# Patient Record
Sex: Female | Born: 1950 | Hispanic: Yes | Smoking: Never smoker
Health system: Southern US, Community
[De-identification: ages and names within clinical notes are randomized; demographics above are authoritative.]

## PROBLEM LIST (undated history)

## (undated) DIAGNOSIS — R202 Paresthesia of skin: Secondary | ICD-10-CM

## (undated) DIAGNOSIS — R2689 Other abnormalities of gait and mobility: Secondary | ICD-10-CM

## (undated) DIAGNOSIS — R42 Dizziness and giddiness: Secondary | ICD-10-CM

## (undated) DIAGNOSIS — R112 Nausea with vomiting, unspecified: Secondary | ICD-10-CM

## (undated) DIAGNOSIS — M5090 Cervical disc disorder, unspecified, unspecified cervical region: Secondary | ICD-10-CM

## (undated) DIAGNOSIS — H9319 Tinnitus, unspecified ear: Secondary | ICD-10-CM

## (undated) DIAGNOSIS — Z87440 Personal history of urinary (tract) infections: Secondary | ICD-10-CM

## (undated) DIAGNOSIS — L661 Lichen planopilaris, unspecified: Secondary | ICD-10-CM

## (undated) DIAGNOSIS — H5319 Other subjective visual disturbances: Secondary | ICD-10-CM

## (undated) DIAGNOSIS — Z8619 Personal history of other infectious and parasitic diseases: Secondary | ICD-10-CM

## (undated) DIAGNOSIS — M3500 Sicca syndrome, unspecified: Secondary | ICD-10-CM

## (undated) DIAGNOSIS — E119 Type 2 diabetes mellitus without complications: Secondary | ICD-10-CM

## (undated) DIAGNOSIS — M5126 Other intervertebral disc displacement, lumbar region: Secondary | ICD-10-CM

## (undated) DIAGNOSIS — G629 Polyneuropathy, unspecified: Secondary | ICD-10-CM

## (undated) DIAGNOSIS — R296 Repeated falls: Secondary | ICD-10-CM

## (undated) DIAGNOSIS — I219 Acute myocardial infarction, unspecified: Secondary | ICD-10-CM

## (undated) DIAGNOSIS — I2510 Atherosclerotic heart disease of native coronary artery without angina pectoris: Secondary | ICD-10-CM

## (undated) DIAGNOSIS — R200 Anesthesia of skin: Secondary | ICD-10-CM

## (undated) DIAGNOSIS — N200 Calculus of kidney: Secondary | ICD-10-CM

## (undated) DIAGNOSIS — E7800 Pure hypercholesterolemia, unspecified: Secondary | ICD-10-CM

## (undated) DIAGNOSIS — Z973 Presence of spectacles and contact lenses: Secondary | ICD-10-CM

## (undated) DIAGNOSIS — A419 Sepsis, unspecified organism: Secondary | ICD-10-CM

## (undated) DIAGNOSIS — Z8719 Personal history of other diseases of the digestive system: Secondary | ICD-10-CM

## (undated) DIAGNOSIS — R319 Hematuria, unspecified: Secondary | ICD-10-CM

## (undated) DIAGNOSIS — J329 Chronic sinusitis, unspecified: Secondary | ICD-10-CM

## (undated) DIAGNOSIS — I10 Essential (primary) hypertension: Secondary | ICD-10-CM

## (undated) DIAGNOSIS — T8859XA Other complications of anesthesia, initial encounter: Secondary | ICD-10-CM

## (undated) DIAGNOSIS — F419 Anxiety disorder, unspecified: Secondary | ICD-10-CM

## (undated) DIAGNOSIS — Z87898 Personal history of other specified conditions: Secondary | ICD-10-CM

## (undated) DIAGNOSIS — W19XXXA Unspecified fall, initial encounter: Secondary | ICD-10-CM

## (undated) DIAGNOSIS — M797 Fibromyalgia: Secondary | ICD-10-CM

## (undated) DIAGNOSIS — Z87442 Personal history of urinary calculi: Secondary | ICD-10-CM

## (undated) HISTORY — PX: CARDIAC CATHETERIZATION: SHX172

## (undated) HISTORY — DX: Lichen planopilaris: L66.1

## (undated) HISTORY — DX: Chronic sinusitis, unspecified: J32.9

## (undated) HISTORY — PX: ABDOMINAL HYSTERECTOMY: SHX81

## (undated) HISTORY — PX: URETERAL STENT PLACEMENT: SHX822

## (undated) HISTORY — PX: NASAL SINUS SURGERY: SHX719

## (undated) HISTORY — PX: TUBAL LIGATION: SHX77

## (undated) HISTORY — DX: Lichen planopilaris, unspecified: L66.10

## (undated) HISTORY — DX: Anxiety disorder, unspecified: F41.9

## (undated) HISTORY — PX: OTHER SURGICAL HISTORY: SHX169

## (undated) HISTORY — DX: Other subjective visual disturbances: H53.19

---

## 2015-01-21 ENCOUNTER — Other Ambulatory Visit: Payer: Self-pay | Admitting: Family Medicine

## 2015-01-21 DIAGNOSIS — R748 Abnormal levels of other serum enzymes: Secondary | ICD-10-CM

## 2015-01-28 ENCOUNTER — Ambulatory Visit
Admission: RE | Admit: 2015-01-28 | Discharge: 2015-01-28 | Disposition: A | Discharge status: Home / Self Care | Payer: Medicare Other | Source: Ambulatory Visit | Attending: Family Medicine | Admitting: Family Medicine

## 2015-01-28 DIAGNOSIS — R748 Abnormal levels of other serum enzymes: Secondary | ICD-10-CM

## 2015-03-29 ENCOUNTER — Other Ambulatory Visit: Payer: Self-pay | Admitting: Family Medicine

## 2015-03-29 DIAGNOSIS — R319 Hematuria, unspecified: Secondary | ICD-10-CM

## 2015-03-30 ENCOUNTER — Ambulatory Visit
Admission: RE | Admit: 2015-03-30 | Discharge: 2015-03-30 | Disposition: A | Discharge status: Home / Self Care | Payer: Medicare Other | Source: Ambulatory Visit | Attending: Family Medicine | Admitting: Family Medicine

## 2015-03-30 DIAGNOSIS — R319 Hematuria, unspecified: Secondary | ICD-10-CM

## 2015-04-19 ENCOUNTER — Other Ambulatory Visit: Payer: Self-pay | Admitting: Urology

## 2015-04-19 ENCOUNTER — Other Ambulatory Visit (HOSPITAL_COMMUNITY): Payer: Self-pay | Admitting: Urology

## 2015-04-19 DIAGNOSIS — N200 Calculus of kidney: Secondary | ICD-10-CM

## 2015-05-14 ENCOUNTER — Encounter (HOSPITAL_COMMUNITY): Payer: Self-pay

## 2015-05-14 ENCOUNTER — Encounter (HOSPITAL_COMMUNITY)
Admission: RE | Admit: 2015-05-14 | Discharge: 2015-05-14 | Disposition: A | Discharge status: Home / Self Care | Payer: Medicare Other | Source: Ambulatory Visit | Attending: Urology | Admitting: Urology

## 2015-05-14 DIAGNOSIS — N200 Calculus of kidney: Secondary | ICD-10-CM | POA: Insufficient documentation

## 2015-05-14 DIAGNOSIS — Z01818 Encounter for other preprocedural examination: Secondary | ICD-10-CM | POA: Insufficient documentation

## 2015-05-14 HISTORY — DX: Unspecified fall, initial encounter: W19.XXXA

## 2015-05-14 HISTORY — DX: Paresthesia of skin: R20.2

## 2015-05-14 HISTORY — DX: Sjogren syndrome, unspecified: M35.00

## 2015-05-14 HISTORY — DX: Personal history of other specified conditions: Z87.898

## 2015-05-14 HISTORY — DX: Fibromyalgia: M79.7

## 2015-05-14 HISTORY — DX: Dizziness and giddiness: R42

## 2015-05-14 HISTORY — DX: Presence of spectacles and contact lenses: Z97.3

## 2015-05-14 HISTORY — DX: Personal history of urinary (tract) infections: Z87.440

## 2015-05-14 HISTORY — DX: Sepsis, unspecified organism: A41.9

## 2015-05-14 HISTORY — DX: Personal history of other infectious and parasitic diseases: Z86.19

## 2015-05-14 HISTORY — DX: Polyneuropathy, unspecified: G62.9

## 2015-05-14 HISTORY — DX: Tinnitus, unspecified ear: H93.19

## 2015-05-14 HISTORY — DX: Anesthesia of skin: R20.0

## 2015-05-14 HISTORY — DX: Other abnormalities of gait and mobility: R26.89

## 2015-05-14 HISTORY — DX: Pure hypercholesterolemia, unspecified: E78.00

## 2015-05-14 HISTORY — DX: Hematuria, unspecified: R31.9

## 2015-05-14 HISTORY — DX: Repeated falls: R29.6

## 2015-05-14 HISTORY — DX: Calculus of kidney: N20.0

## 2015-05-14 HISTORY — DX: Personal history of other diseases of the digestive system: Z87.19

## 2015-05-14 HISTORY — DX: Essential (primary) hypertension: I10

## 2015-05-14 HISTORY — DX: Cervical disc disorder, unspecified, unspecified cervical region: M50.90

## 2015-05-14 HISTORY — DX: Other intervertebral disc displacement, lumbar region: M51.26

## 2015-05-14 LAB — CBC
HEMATOCRIT: 42.7 % (ref 36.0–46.0)
HEMOGLOBIN: 14.5 g/dL (ref 12.0–15.0)
MCH: 32.4 pg (ref 26.0–34.0)
MCHC: 34 g/dL (ref 30.0–36.0)
MCV: 95.5 fL (ref 78.0–100.0)
Platelets: 213 10*3/uL (ref 150–400)
RBC: 4.47 MIL/uL (ref 3.87–5.11)
RDW: 12 % (ref 11.5–15.5)
WBC: 5.2 10*3/uL (ref 4.0–10.5)

## 2015-05-14 LAB — BASIC METABOLIC PANEL
ANION GAP: 10 (ref 5–15)
BUN: 20 mg/dL (ref 6–20)
CHLORIDE: 100 mmol/L — AB (ref 101–111)
CO2: 28 mmol/L (ref 22–32)
CREATININE: 1 mg/dL (ref 0.44–1.00)
Calcium: 9.6 mg/dL (ref 8.9–10.3)
GFR calc non Af Amer: 58 mL/min — ABNORMAL LOW (ref >60)
GLUCOSE: 131 mg/dL — AB (ref 65–99)
Potassium: 4.1 mmol/L (ref 3.5–5.1)
Sodium: 138 mmol/L (ref 135–145)

## 2015-05-14 NOTE — Progress Notes (Signed)
Your patient has screened at an elevated risk for Obstructive Sleep Apnea using the Stop-Bang Tool during a pre-surgical visit. Pt scored at high risk.  

## 2015-05-14 NOTE — Patient Instructions (Addendum)
Tiffany Odonnell  05/14/2015   Your procedure is scheduled on: Monday May 31, 2015   Report to [facility]  Entrance take [place]  elevators to 3rd floor to  Short Stay Center at 7:30 AM.  Call this number if you have problems the morning of surgery [phone]   Remember: ONLY 1 PERSON MAY GO WITH YOU TO SHORT STAY TO GET  READY MORNING OF YOUR SURGERY.  Do not eat food or drink liquids :After Midnight.     MAY USE EYE DROPS DAY OF SURGERY IF NEEDED (BRING WITH YOU DAY OF SURGERY)                               You may not have any metal on your body including hair pins and              piercings  Do not wear jewelry, make-up, lotions, powders or perfumes, deodorant             Do not wear nail polish.  Do not shave  48 hours prior to surgery.               Do not bring valuables to the hospital. [facility] IS NOT             RESPONSIBLE   FOR VALUABLES.  Contacts, dentures or bridgework may not be worn into surgery.  Leave suitcase in the car. After surgery it may be brought to your room.   _____________________________________________________________________             [facility] - Preparing for Surgery Before surgery, you can play an important role.  Because skin is not sterile, your skin needs to be as free of germs as possible.  You can reduce the number of germs on your skin by washing with CHG (chlorahexidine gluconate) soap before surgery.  CHG is an antiseptic cleaner which kills germs and bonds with the skin to continue killing germs even after washing. Please DO NOT use if you have an allergy to CHG or antibacterial soaps.  If your skin becomes reddened/irritated stop using the CHG and inform your nurse when you arrive at Short Stay. Do not shave (including legs and underarms) for at least 48 hours prior to the first CHG shower.  You may shave your face/neck. Please follow these instructions carefully:  1.  Shower with CHG Soap the night  before surgery and the  morning of Surgery.  2.  If you choose to wash your hair, wash your hair first as usual with your  normal  shampoo.  3.  After you shampoo, rinse your hair and body thoroughly to remove the  shampoo.                           4.  Use CHG as you would any other liquid soap.  You can apply chg directly  to the skin and wash                       Gently with a scrungie or clean washcloth.  5.  Apply the CHG Soap to your body ONLY FROM THE NECK DOWN.   Do not use on face/ open  Wound or open sores. Avoid contact with eyes, ears mouth and genitals (private parts).                       Wash face,  Genitals (private parts) with your normal soap.             6.  Wash thoroughly, paying special attention to the area where your surgery  will be performed.  7.  Thoroughly rinse your body with warm water from the neck down.  8.  DO NOT shower/wash with your normal soap after using and rinsing off  the CHG Soap.                9.  Pat yourself dry with a clean towel.            10.  Wear clean pajamas.            11.  Place clean sheets on your bed the night of your first shower and do not  sleep with pets. Day of Surgery : Do not apply any lotions/deodorants the morning of surgery.  Please wear clean clothes to the hospital/surgery center.  FAILURE TO FOLLOW THESE INSTRUCTIONS MAY RESULT IN THE CANCELLATION OF YOUR SURGERY PATIENT SIGNATURE_________________________________  NURSE SIGNATURE__________________________________  ________________________________________________________________________

## 2015-05-26 ENCOUNTER — Other Ambulatory Visit: Payer: Self-pay | Admitting: Radiology

## 2015-05-28 ENCOUNTER — Other Ambulatory Visit: Payer: Self-pay | Admitting: Radiology

## 2015-05-30 MED ORDER — ATENOLOL 100 MG PO TABS
100.0000 mg | ORAL_TABLET | Freq: Once | ORAL | Status: AC
  Administered 2015-05-31: 100 mg via ORAL
  Filled 2015-05-30: qty 1

## 2015-05-30 NOTE — Discharge Instructions (Signed)

## 2015-05-30 NOTE — H&P (Signed)
64 YO female patient with a large right staghorn calculus.    GU hx:  Initially seen Oct 2016 as a f/u from 9/16 the patient was experiencing some right flank pain that was different from flank pain she had experienced previously with kidney stones. She also indicated that she was experiencing intermittent gross hematuria with activity.     She has a history of Sjogren's syndrome placing her at risk for stone formation due to distal RTA associated with the syndrome. She is taking a blood pressure medicine that contains a thiazide.     Renal ultrasound 03/30/15 revealed right renal calculi the largest measuring 9 mm by ultrasound and no left renal calculi with what appeared to be a left renal cyst that was small and had no blood flow but was called indeterminate by the radiologist. Visualization was somewhat poor.    She reports a history of calculus disease having passed stones over the years primarily from the right hand side. She also indicates that in 2013 she underwent a right ureteral stent placement for stone and also has had lithotripsy that that the pain she was experiencing in her back was due to her history of back pain. The last time she passed a stone was about 1.5 years ago.    Past Medical History Problems  1. History of Anxiety (F41.9) 2. History of fibromyalgia (Z87.39) 3. History of irritable bowel syndrome (Z87.19)  Surgical History Problems  1. History of Bladder Surgery 2. History of Frontal Sinusotomy 3. History of Hysterectomy 4. History of Knee Surgery Right 5. History of Oophorectomy 6. History of Tubal Ligation  Current Meds 1. Atenolol-Chlorthalidone 100-25 MG Oral Tablet;  Therapy: (Recorded:13Oct2016) to Recorded 2. Cyclobenzaprine HCl TABS;  Therapy: (Recorded:17Nov2016) to Recorded 3. Milk Thistle CAPS;  Therapy: (Recorded:13Oct2016) to Recorded 4. Omega 3 1000 MG Oral Capsule;  Therapy: (Recorded:13Oct2016) to Recorded 5. Vitamin B Complex  CAPS;  Therapy: (Recorded:13Oct2016) to Recorded 6. Vitamin D3 CAPS;  Therapy: (Recorded:13Oct2016) to Recorded  Allergies Medication  1. Cipro TABS 2. Macrobid CAPS 3. Sulfa Drugs 4. Tetracyclines  Family History Problems  1. Family history of cardiac disorder (Z82.49) : Mother 2. Family history of diabetes mellitus (Z83.3) : Father  Social History Problems  1. Denied: History of Alcohol use 2. Denied: History of Caffeine use 3. Divorced 4. Never a smoker 5. Two children    Results/Data  The following clinical lab reports were reviewed:  UA: appears grossly infected Will culture. Selected Results  URINE CULTURE 17Nov2016 09:35AM Myrtie Soman, Diane SOURCE : CLEAN CATCH SPECIMEN TYPE: URINE  Test Name Result Flag Reference CULTURE, URINE Culture, Urine   ===== COLONY COUNT: =====  >=100,000 COLONIES/ML   FINAL REPORT: PROTEUS MIRABILIS    THIS ORGANISM MAY SHOW IMIPENEM RESISTANCE BY MECHANISMS OTHER THAN A CARBAPENEMASE.   SENSITIVITY FOR: PROTEUS MIRABILIS    AMPICILLIN                             SENSITIVE        <=2    AMOX/CLAVULANIC                        SENSITIVE        <=2    AMPICILLIN/SUL                         SENSITIVE        <=2  PIPERACILLIN/TAZO                      SENSITIVE        <=4    IMIPENEM                               INDETERMINATE      2    CEFAZOLIN                              NR               <=4    CEFTRIAXONE                            SENSITIVE        <=1    CEFTAZIDIME                            SENSITIVE        <=1    CEFEPIME                               SENSITIVE        <=1    GENTAMICIN                             SENSITIVE        <=1    TOBRAMYCIN                             SENSITIVE        <=1    CIPROFLOXACIN                          SENSITIVE     <=0.25    LEVOFLOXACIN                           SENSITIVE     <=0.12    NITROFURANTOIN                         RESISTANT        128    TRIMETH/SULFA                           SENSITIVE       <=20   Review of Systems Genitourinary, constitutional, skin, eye, otolaryngeal, hematologic/lymphatic, cardiovascular, pulmonary, endocrine, musculoskeletal, gastrointestinal, neurological and psychiatric system(s) were reviewed and pertinent findings if present are noted and are otherwise negative.  Genitourinary: urinary frequency, urinary urgency, nocturia, incontinence and hematuria.  Constitutional: night sweats and feeling tired (fatigue).  Integumentary: skin rash/lesion.  Eyes: blurred vision.  ENT: sinus problems.  Neurological: dizziness.  Psychiatric: anxiety.    Vitals Vital Signs  Height: 5 ft 1 in Weight: 178 lb  BMI Calculated: 33.63 BSA Calculated: 1.8 Blood Pressure: 140 / 76 Heart Rate: 65  Physical Exam Constitutional: Well nourished and well developed . No acute distress.   ENT:. The ears and nose are normal in appearance.   Neck: The appearance  of the neck is normal and no neck mass is present.   Pulmonary: No respiratory distress and normal respiratory rhythm and effort.   Cardiovascular: Heart rate and rhythm are normal . No peripheral edema.   Abdomen: The abdomen is soft and nontender. No masses are palpated. No CVA tenderness. No hernias are palpable. No hepatosplenomegaly noted.   Lymphatics: The femoral and inguinal nodes are not enlarged or tender.   Skin: Normal skin turgor, no visible rash and no visible skin lesions.   Neuro/Psych:. Mood and affect are appropriate.    Results/Data  Old records or history reviewed: Notes from Dr. Pecola [facility]isure as above.  The following images/tracing/specimen were independently visualized:  KUB: I note a staghorn calculus filling the entire kidney on the right hand side measuring 7.5 cm in length by 4.6 cm in width. I see no stones along the course of the ureters.  The following radiology reports were reviewed: Renal ultrasound.    Assessment   We discussed the management of urinary stones.  These options include observation, ureteroscopy, shockwave lithotripsy, and PCNL. We discussed which options are relevant to these particular stones. We discussed the natural history of stones as well as the complications of untreated stones and the impact on quality of life without treatment as well as with each of the above listed treatments. We also discussed the efficacy of each treatment in its ability to clear the stone burden. With any of these management options I discussed the signs and symptoms of infection and the need for emergent treatment should these be experienced. For each option we discussed the ability of each procedure to clear the patient of their stone burden.    For observation I described the risks which include but are not limited to silent renal damage, life-threatening infection, need for emergent surgery, failure to pass stone, and pain.    For ureteroscopy I described the risks which include heart attack, stroke, pulmonary embolus, death, bleeding, infection, damage to contiguous structures, positioning injury, ureteral stricture, ureteral avulsion, ureteral injury, need for ureteral stent, inability to perform ureteroscopy, need for an interval procedure, inability to clear stone burden, stent discomfort and pain.    For shockwave lithotripsy I described the risks which include arrhythmia, kidney contusion, kidney hemorrhage, need for transfusion, long-term risk of diabetes or hypertension, back discomfort, flank ecchymosis, flank abrasion, inability to break up stone, inability to pass stone fragments, Steinstrasse, infection associated with obstructing stones, need for different surgical procedure, need for repeat shockwave lithotripsy, and death.    For PCNL I described the risks including heart attack, sure, pulmonary embolus, death, positioning injury, pneumothorax, hydrothorax, need for chest tube, inability to clear stone burden, renal laceration, arterial venous  fistula or malformation, need for embolization of kidney, loss of kidney or renal function, need for repeat procedure, need for prolonged nephrostomy tube, ureteral avulsion, fistula.     I discussed with her the fact that her stone is causing her some discomfort although it does not appear to be causing obstruction and therefore treatment is not urgent but is required. We discussed the reason that ureteroscopy and lithotripsy would not be good treatment options due to the size of her stone and therefore I have recommended PCNL. Due to the large size of her stone we did discuss the fact that this would likely be a staged procedure requiring a second procedure either ureteroscopically or through an existing nephrostomy tract or new nephrostomy tract. She would like to discuss this further with her  family. [facility] the [facility]. I asked her to contact me if she would like to proceed here in [place].    I did obtain a CT scan today in order to delineate the anatomy of her collecting system and her colon relative to her right kidney in preparation for a percutaneous procedure. The scan revealed a complete right staghorn calculus with some scattered diverticuli that appear to be either intraparenchymal or more likely in peripheral calyces. She also had some punctate stone seen on the left-hand side. Her staghorn calculus had Hounsfield units that ranged from 450-850 depending on the area measured.   A urine culture was positive for Proteus indicating that her stone is likely struvite and every attempt will therefore be made to clear her completely of all stone material to prevent recurrence.  The organism was sensitive to ampicillin and she was placed on this antibiotic and remained on it until the time of her surgery.  Plan:   She will be scheduled for a right percutaneous nephrostolithotomy.  This will be the first stage of a two-stage procedure.

## 2015-05-31 ENCOUNTER — Ambulatory Visit (HOSPITAL_COMMUNITY)
Admission: RE | Admit: 2015-05-31 | Discharge: 2015-05-31 | Disposition: A | Discharge status: Home / Self Care | Payer: Medicare Other | Source: Ambulatory Visit | Attending: Urology | Admitting: Urology

## 2015-05-31 ENCOUNTER — Encounter (HOSPITAL_COMMUNITY): Payer: Self-pay | Admitting: Registered Nurse

## 2015-05-31 ENCOUNTER — Ambulatory Visit (HOSPITAL_COMMUNITY): Payer: Medicare Other | Admitting: Registered Nurse

## 2015-05-31 ENCOUNTER — Ambulatory Visit (HOSPITAL_COMMUNITY)
Admission: RE | Admit: 2015-05-31 | Discharge: 2015-06-01 | Disposition: A | Discharge status: Home / Self Care | Payer: Medicare Other | Source: Ambulatory Visit | Attending: Urology | Admitting: Urology

## 2015-05-31 ENCOUNTER — Encounter (HOSPITAL_COMMUNITY): Payer: Self-pay

## 2015-05-31 ENCOUNTER — Encounter (HOSPITAL_COMMUNITY)
Admission: RE | Disposition: A | Discharge status: Home / Self Care | Payer: Self-pay | Source: Ambulatory Visit | Attending: Urology

## 2015-05-31 ENCOUNTER — Ambulatory Visit (HOSPITAL_COMMUNITY): Payer: Medicare Other

## 2015-05-31 DIAGNOSIS — J479 Bronchiectasis, uncomplicated: Secondary | ICD-10-CM | POA: Diagnosis not present

## 2015-05-31 DIAGNOSIS — Z90710 Acquired absence of both cervix and uterus: Secondary | ICD-10-CM | POA: Insufficient documentation

## 2015-05-31 DIAGNOSIS — B964 Proteus (mirabilis) (morganii) as the cause of diseases classified elsewhere: Secondary | ICD-10-CM | POA: Diagnosis not present

## 2015-05-31 DIAGNOSIS — M797 Fibromyalgia: Secondary | ICD-10-CM | POA: Diagnosis not present

## 2015-05-31 DIAGNOSIS — R2689 Other abnormalities of gait and mobility: Secondary | ICD-10-CM | POA: Insufficient documentation

## 2015-05-31 DIAGNOSIS — Z882 Allergy status to sulfonamides status: Secondary | ICD-10-CM | POA: Diagnosis not present

## 2015-05-31 DIAGNOSIS — G629 Polyneuropathy, unspecified: Secondary | ICD-10-CM | POA: Diagnosis not present

## 2015-05-31 DIAGNOSIS — N390 Urinary tract infection, site not specified: Secondary | ICD-10-CM | POA: Diagnosis not present

## 2015-05-31 DIAGNOSIS — M3500 Sicca syndrome, unspecified: Secondary | ICD-10-CM | POA: Diagnosis not present

## 2015-05-31 DIAGNOSIS — G4730 Sleep apnea, unspecified: Secondary | ICD-10-CM | POA: Diagnosis not present

## 2015-05-31 DIAGNOSIS — N200 Calculus of kidney: Secondary | ICD-10-CM

## 2015-05-31 DIAGNOSIS — I10 Essential (primary) hypertension: Secondary | ICD-10-CM | POA: Diagnosis not present

## 2015-05-31 DIAGNOSIS — Z881 Allergy status to other antibiotic agents status: Secondary | ICD-10-CM | POA: Insufficient documentation

## 2015-05-31 DIAGNOSIS — K449 Diaphragmatic hernia without obstruction or gangrene: Secondary | ICD-10-CM | POA: Insufficient documentation

## 2015-05-31 DIAGNOSIS — Z87442 Personal history of urinary calculi: Secondary | ICD-10-CM | POA: Insufficient documentation

## 2015-05-31 DIAGNOSIS — E7800 Pure hypercholesterolemia, unspecified: Secondary | ICD-10-CM | POA: Insufficient documentation

## 2015-05-31 HISTORY — PX: NEPHROLITHOTOMY: SHX5134

## 2015-05-31 LAB — BASIC METABOLIC PANEL
Anion gap: 12 (ref 5–15)
BUN: 18 mg/dL (ref 6–20)
CALCIUM: 9.7 mg/dL (ref 8.9–10.3)
CO2: 26 mmol/L (ref 22–32)
CREATININE: 1.07 mg/dL — AB (ref 0.44–1.00)
Chloride: 100 mmol/L — ABNORMAL LOW (ref 101–111)
GFR calc non Af Amer: 54 mL/min — ABNORMAL LOW (ref >60)
GLUCOSE: 145 mg/dL — AB (ref 65–99)
Potassium: 3 mmol/L — ABNORMAL LOW (ref 3.5–5.1)
Sodium: 138 mmol/L (ref 135–145)

## 2015-05-31 LAB — CBC
HCT: 41.4 % (ref 36.0–46.0)
Hemoglobin: 14.3 g/dL (ref 12.0–15.0)
MCH: 32.6 pg (ref 26.0–34.0)
MCHC: 34.5 g/dL (ref 30.0–36.0)
MCV: 94.5 fL (ref 78.0–100.0)
PLATELETS: 213 10*3/uL (ref 150–400)
RBC: 4.38 MIL/uL (ref 3.87–5.11)
RDW: 12.1 % (ref 11.5–15.5)
WBC: 5.2 10*3/uL (ref 4.0–10.5)

## 2015-05-31 LAB — APTT: aPTT: 28 seconds (ref 24–37)

## 2015-05-31 LAB — PROTIME-INR
INR: 0.98 (ref 0.00–1.49)
PROTHROMBIN TIME: 13.2 s (ref 11.6–15.2)

## 2015-05-31 SURGERY — NEPHROLITHOTOMY PERCUTANEOUS
Anesthesia: General | Laterality: Right

## 2015-05-31 MED ORDER — MIDAZOLAM HCL 5 MG/5ML IJ SOLN
INTRAMUSCULAR | Status: DC | PRN
  Administered 2015-05-31: 1 mg via INTRAVENOUS

## 2015-05-31 MED ORDER — NEOSTIGMINE METHYLSULFATE 10 MG/10ML IV SOLN
INTRAVENOUS | Status: DC | PRN
  Administered 2015-05-31: 4 mg via INTRAVENOUS

## 2015-05-31 MED ORDER — SODIUM CHLORIDE 0.9 % IR SOLN
Status: DC | PRN
  Administered 2015-05-31: 24000 mL

## 2015-05-31 MED ORDER — MIDAZOLAM HCL 2 MG/2ML IJ SOLN
INTRAMUSCULAR | Status: AC | PRN
  Administered 2015-05-31 (×5): 1 mg via INTRAVENOUS

## 2015-05-31 MED ORDER — FENTANYL CITRATE (PF) 250 MCG/5ML IJ SOLN
INTRAMUSCULAR | Status: AC
  Filled 2015-05-31: qty 5

## 2015-05-31 MED ORDER — FENTANYL CITRATE (PF) 100 MCG/2ML IJ SOLN
INTRAMUSCULAR | Status: AC
  Filled 2015-05-31: qty 4

## 2015-05-31 MED ORDER — MIDAZOLAM HCL 2 MG/2ML IJ SOLN
INTRAMUSCULAR | Status: AC
  Filled 2015-05-31: qty 2

## 2015-05-31 MED ORDER — ONDANSETRON HCL 4 MG/2ML IJ SOLN
INTRAMUSCULAR | Status: DC | PRN
  Administered 2015-05-31: 4 mg via INTRAVENOUS

## 2015-05-31 MED ORDER — IOHEXOL 300 MG/ML  SOLN
3.0000 mL | Freq: Once | INTRAMUSCULAR | Status: AC | PRN
  Administered 2015-05-31: 3 mL

## 2015-05-31 MED ORDER — ROCURONIUM BROMIDE 100 MG/10ML IV SOLN
INTRAVENOUS | Status: DC | PRN
  Administered 2015-05-31: 30 mg via INTRAVENOUS

## 2015-05-31 MED ORDER — LACTATED RINGERS IV SOLN
INTRAVENOUS | Status: DC | PRN
  Administered 2015-05-31 (×2): via INTRAVENOUS

## 2015-05-31 MED ORDER — IOHEXOL 300 MG/ML  SOLN
INTRAMUSCULAR | Status: DC | PRN
  Administered 2015-05-31: 3 mL via URETHRAL

## 2015-05-31 MED ORDER — LIDOCAINE HCL 1 % IJ SOLN
INTRAMUSCULAR | Status: AC
  Filled 2015-05-31: qty 20

## 2015-05-31 MED ORDER — CEFAZOLIN SODIUM-DEXTROSE 2-3 GM-% IV SOLR
INTRAVENOUS | Status: AC
  Filled 2015-05-31: qty 50

## 2015-05-31 MED ORDER — HYDROMORPHONE HCL 1 MG/ML IJ SOLN
0.2500 mg | INTRAMUSCULAR | Status: DC | PRN
  Administered 2015-05-31: 0.5 mg via INTRAVENOUS
  Administered 2015-05-31: 0.25 mg via INTRAVENOUS

## 2015-05-31 MED ORDER — OXYCODONE-ACETAMINOPHEN 5-325 MG PO TABS
1.0000 | ORAL_TABLET | ORAL | Status: DC | PRN
  Administered 2015-06-01: 1 via ORAL
  Filled 2015-05-31: qty 1

## 2015-05-31 MED ORDER — SUCCINYLCHOLINE CHLORIDE 20 MG/ML IJ SOLN
INTRAMUSCULAR | Status: DC | PRN
  Administered 2015-05-31: 100 mg via INTRAVENOUS

## 2015-05-31 MED ORDER — PROPOFOL 10 MG/ML IV BOLUS
INTRAVENOUS | Status: AC
  Filled 2015-05-31: qty 20

## 2015-05-31 MED ORDER — FENTANYL CITRATE (PF) 100 MCG/2ML IJ SOLN
INTRAMUSCULAR | Status: AC | PRN
  Administered 2015-05-31: 25 ug via INTRAVENOUS
  Administered 2015-05-31: 50 ug via INTRAVENOUS

## 2015-05-31 MED ORDER — PROPOFOL 10 MG/ML IV BOLUS
INTRAVENOUS | Status: DC | PRN
  Administered 2015-05-31: 140 mg via INTRAVENOUS

## 2015-05-31 MED ORDER — GLYCOPYRROLATE 0.2 MG/ML IJ SOLN
INTRAMUSCULAR | Status: AC
Start: 2015-05-31 — End: 2015-05-31
  Filled 2015-05-31: qty 3

## 2015-05-31 MED ORDER — SODIUM CHLORIDE 0.9 % IV SOLN
INTRAVENOUS | Status: DC
  Administered 2015-05-31: 1000 mL via INTRAVENOUS
  Administered 2015-05-31 – 2015-06-01 (×2): via INTRAVENOUS

## 2015-05-31 MED ORDER — HYDROMORPHONE HCL 1 MG/ML IJ SOLN
INTRAMUSCULAR | Status: AC
  Administered 2015-05-31: 0.5 mg via INTRAVENOUS
  Filled 2015-05-31: qty 1

## 2015-05-31 MED ORDER — FENTANYL CITRATE (PF) 100 MCG/2ML IJ SOLN
INTRAMUSCULAR | Status: DC | PRN
  Administered 2015-05-31 (×3): 50 ug via INTRAVENOUS

## 2015-05-31 MED ORDER — LIDOCAINE HCL (CARDIAC) 20 MG/ML IV SOLN
INTRAVENOUS | Status: AC
  Filled 2015-05-31: qty 5

## 2015-05-31 MED ORDER — ONDANSETRON HCL 4 MG/2ML IJ SOLN
INTRAMUSCULAR | Status: AC
  Filled 2015-05-31: qty 2

## 2015-05-31 MED ORDER — GLYCOPYRROLATE 0.2 MG/ML IJ SOLN
INTRAMUSCULAR | Status: DC | PRN
  Administered 2015-05-31: .6 mg via INTRAVENOUS

## 2015-05-31 MED ORDER — EPHEDRINE SULFATE 50 MG/ML IJ SOLN
INTRAMUSCULAR | Status: DC | PRN
  Administered 2015-05-31 (×3): 5 mg via INTRAVENOUS

## 2015-05-31 MED ORDER — HYDROMORPHONE HCL 1 MG/ML IJ SOLN: 0.5000 mg | INTRAMUSCULAR | Status: DC | PRN

## 2015-05-31 MED ORDER — BELLADONNA ALKALOIDS-OPIUM 16.2-60 MG RE SUPP: 1.0000 | Freq: Four times a day (QID) | RECTAL | Status: DC | PRN

## 2015-05-31 MED ORDER — SODIUM CHLORIDE 0.9 % IV SOLN
1.5000 g | Freq: Four times a day (QID) | INTRAVENOUS | Status: AC
  Administered 2015-05-31 – 2015-06-01 (×3): 1.5 g via INTRAVENOUS
  Filled 2015-05-31 (×3): qty 1.5

## 2015-05-31 MED ORDER — ZOLPIDEM TARTRATE 5 MG PO TABS: 5.0000 mg | ORAL_TABLET | Freq: Every evening | ORAL | Status: DC | PRN

## 2015-05-31 MED ORDER — ACETAMINOPHEN 325 MG PO TABS: 650.0000 mg | ORAL_TABLET | ORAL | Status: DC | PRN

## 2015-05-31 MED ORDER — CEFAZOLIN SODIUM-DEXTROSE 2-3 GM-% IV SOLR
2.0000 g | INTRAVENOUS | Status: DC
  Administered 2015-05-31: 2 g via INTRAVENOUS

## 2015-05-31 MED ORDER — MIDAZOLAM HCL 2 MG/2ML IJ SOLN
INTRAMUSCULAR | Status: AC
  Filled 2015-05-31: qty 6

## 2015-05-31 MED ORDER — CEFAZOLIN SODIUM-DEXTROSE 2-3 GM-% IV SOLR: 2.0000 g | INTRAVENOUS | Status: DC

## 2015-05-31 MED ORDER — LIDOCAINE HCL (CARDIAC) 20 MG/ML IV SOLN
INTRAVENOUS | Status: DC | PRN
  Administered 2015-05-31: 80 mg via INTRAVENOUS

## 2015-05-31 MED ORDER — SODIUM CHLORIDE 0.9 % IV SOLN
INTRAVENOUS | Status: DC
  Administered 2015-05-31: 09:00:00 via INTRAVENOUS

## 2015-05-31 MED ORDER — LACTATED RINGERS IV SOLN: INTRAVENOUS | Status: DC

## 2015-05-31 MED ORDER — ONDANSETRON HCL 4 MG/2ML IJ SOLN
4.0000 mg | INTRAMUSCULAR | Status: DC | PRN
  Administered 2015-05-31: 4 mg via INTRAVENOUS
  Filled 2015-05-31: qty 2

## 2015-05-31 SURGICAL SUPPLY — 53 items
APPLICATOR SURGIFLO ENDO (HEMOSTASIS) IMPLANT
BAG URINE DRAINAGE (UROLOGICAL SUPPLIES) IMPLANT
BASKET ZERO TIP NITINOL 2.4FR (BASKET) ×2 IMPLANT
BENZOIN TINCTURE PRP APPL 2/3 (GAUZE/BANDAGES/DRESSINGS) ×2 IMPLANT
BLADE SURG 15 STRL LF DISP TIS (BLADE) ×1 IMPLANT
BLADE SURG 15 STRL SS (BLADE) ×1
CATH FOLEY 2W COUNCIL 20FR 5CC (CATHETERS) IMPLANT
CATH FOLEY 2WAY SLVR  5CC 18FR (CATHETERS)
CATH FOLEY 2WAY SLVR 5CC 18FR (CATHETERS) IMPLANT
CATH FOLEY LATEX FREE 22FR (CATHETERS) ×1
CATH FOLEY LF 22FR (CATHETERS) ×1 IMPLANT
CATH IMAGER II 65CM (CATHETERS) IMPLANT
CATH UROLOGY TORQUE 40 (MISCELLANEOUS) ×2 IMPLANT
CATH X-FORCE N30 NEPHROSTOMY (TUBING) ×2 IMPLANT
COVER SURGICAL LIGHT HANDLE (MISCELLANEOUS) ×2 IMPLANT
DRAPE C-ARM 42X120 X-RAY (DRAPES) ×2 IMPLANT
DRAPE LINGEMAN PERC (DRAPES) ×2 IMPLANT
DRAPE SURG IRRIG POUCH 19X23 (DRAPES) ×2 IMPLANT
DRSG PAD ABDOMINAL 8X10 ST (GAUZE/BANDAGES/DRESSINGS) IMPLANT
DRSG TEGADERM 8X12 (GAUZE/BANDAGES/DRESSINGS) IMPLANT
FIBER LASER FLEXIVA 1000 (UROLOGICAL SUPPLIES) IMPLANT
FIBER LASER FLEXIVA 200 (UROLOGICAL SUPPLIES) IMPLANT
FIBER LASER FLEXIVA 365 (UROLOGICAL SUPPLIES) IMPLANT
FIBER LASER FLEXIVA 550 (UROLOGICAL SUPPLIES) IMPLANT
FIBER LASER TRAC TIP (UROLOGICAL SUPPLIES) IMPLANT
FLOSEAL 10ML (HEMOSTASIS) IMPLANT
GAUZE SPONGE 4X4 12PLY STRL (GAUZE/BANDAGES/DRESSINGS) IMPLANT
GLOVE BIOGEL M 8.0 STRL (GLOVE) ×16 IMPLANT
GOWN STRL REUS W/TWL XL LVL3 (GOWN DISPOSABLE) ×8 IMPLANT
GUIDEWIRE AMPLAZ .035X145 (WIRE) ×4 IMPLANT
GUIDEWIRE STR DUAL SENSOR (WIRE) IMPLANT
HOLDER FOLEY CATH W/STRAP (MISCELLANEOUS) ×2 IMPLANT
KIT BASIN OR (CUSTOM PROCEDURE TRAY) ×2 IMPLANT
MANIFOLD NEPTUNE II (INSTRUMENTS) ×2 IMPLANT
NS IRRIG 1000ML POUR BTL (IV SOLUTION) ×2 IMPLANT
PACK CYSTO (CUSTOM PROCEDURE TRAY) ×2 IMPLANT
PROBE LITHOCLAST ULTRA 3.8X403 (UROLOGICAL SUPPLIES) ×2 IMPLANT
PROBE PNEUMATIC 1.0MMX570MM (UROLOGICAL SUPPLIES) ×2 IMPLANT
SET IRRIG Y TYPE TUR BLADDER L (SET/KITS/TRAYS/PACK) IMPLANT
SET WARMING FLUID IRRIGATION (MISCELLANEOUS) ×2 IMPLANT
SHEATH PEELAWAY SET 9 (SHEATH) ×2 IMPLANT
STENT URET 6FRX24 CONTOUR (STENTS) ×2 IMPLANT
STONE CATCHER W/TUBE ADAPTER (UROLOGICAL SUPPLIES) ×2 IMPLANT
SURGIFLO W/THROMBIN 8M KIT (HEMOSTASIS) IMPLANT
SUT MNCRL AB 4-0 PS2 18 (SUTURE) IMPLANT
SUT SILK 2 0 30  PSL (SUTURE)
SUT SILK 2 0 30 PSL (SUTURE) IMPLANT
SYR 20CC LL (SYRINGE) ×4 IMPLANT
SYRINGE 10CC LL (SYRINGE) ×4 IMPLANT
TOWEL OR NON WOVEN STRL DISP B (DISPOSABLE) ×2 IMPLANT
TRAY FOLEY W/METER SILVER 14FR (SET/KITS/TRAYS/PACK) ×2 IMPLANT
TRAY FOLEY W/METER SILVER 16FR (SET/KITS/TRAYS/PACK) IMPLANT
TUBING CONNECTING 10 (TUBING) ×4 IMPLANT

## 2015-05-31 NOTE — Anesthesia Procedure Notes (Signed)
Procedure Name: Intubation Date/Time: 05/31/2015 10:58 AM Performed by: Jarvis NewcomerARMISTEAD, Tanner Vigna A Pre-anesthesia Checklist: Patient identified, Timeout performed, Emergency Drugs available, Suction available and Patient being monitored Patient Re-evaluated:Patient Re-evaluated prior to inductionOxygen Delivery Method: Circle system utilized Preoxygenation: Pre-oxygenation with 100% oxygen Intubation Type: IV induction Ventilation: Mask ventilation without difficulty Laryngoscope Size: Mac and 4 Grade View: Grade I Tube type: Oral Tube size: 7.0 mm Number of attempts: 1 Airway Equipment and Method: Stylet Placement Confirmation: ETT inserted through vocal cords under direct vision,  breath sounds checked- equal and bilateral and positive ETCO2 Secured at: 21 cm Tube secured with: Tape Dental Injury: Teeth and Oropharynx as per pre-operative assessment

## 2015-05-31 NOTE — Procedures (Signed)
R [id number] antegrade nephroureteral placed to bladder No complication No blood loss. See [facility]ation in [facility].

## 2015-05-31 NOTE — Progress Notes (Signed)
Floor called and ready for pt in 1402:needs bed and pump.

## 2015-05-31 NOTE — Progress Notes (Signed)
Night of surgery note  Subjective: The patient is doing well.  No complaints.  Objective: Vital signs in last 24 hours: Temp:  [97.5 F (36.4 C)-98.1 F (36.7 C)] 97.5 F (36.4 C) (11/28 1550) Pulse Rate:  [50-77] 77 (11/28 1550) Resp:  [10-20] 16 (11/28 1550) BP: (95-151)/(52-93) 98/57 mmHg (11/28 1550) SpO2:  [98 %-100 %] 99 % (11/28 1550) Weight:  [79 kg (174 lb 2.6 oz)] 79 kg (174 lb 2.6 oz) (11/28 1725)  Intake/Output this shift: Total I/O In: 2200 [I.V.:2200] Out: 450 [Urine:350; Blood:100]  Physical Exam:  General: Alert and oriented. Abdomen: Soft, Nondistended. Nephrostomy site: Clean with dry, intact dressing. Nephrostomy catheter draining.  Lab Results:  Recent Labs  05/31/15 0830  HGB 14.3  HCT 41.4    Assessment/Plan: 1) Continue to monitor 2) Per orders   Sherma Vanmetre C. Vernie Ammonsttelin, MD  Garnett FarmTTELIN,Ridhi Hoffert C 05/31/2015, 5:49 PM

## 2015-05-31 NOTE — H&P (Signed)
Chief Complaint: Patient was seen in consultation today for right percutaneous nephrostomy/nephroureteral catheter placement  Referring Physician(s): Ottelin,M  History of Present Illness: Tiffany Odonnell is a 64 y.o. female with history of nephrolithiasis and now with right staghorn calculus who presents today for right nephroureteral catheter placement prior to planned nephrolithotomy.  Past Medical History  Diagnosis Date  . Hypertension   . High cholesterol   . Dizziness   . Numbness and tingling     hands and feet bilat   . Sjogren's disease (HCC)   . Fibromyalgia   . Peripheral neuropathy (HCC)   . Lumbar herniated disc     L5-L6  . History of hiatal hernia   . Blood in urine   . History of frequent urinary tract infections   . Kidney stones   . Wears glasses   . H/O Salmonella gastroenteritis   . Septicemia (HCC)     2013  . Falls   . Cervical disc disorder     bulging disc  . Tinnitus   . Imbalance   . History of vertigo     Past Surgical History  Procedure Laterality Date  . Tubal ligation      1983  . Nasal sinus surgery    . Prolapsed bladder and rectocele surgery     . Abdominal hysterectomy      2014  . Ureteral stent placement      times 2 secondary to kidney stone     Allergies: Ciprofloxacin; Macrolides and ketolides; Sulfa antibiotics; and Tetracyclines & related  Medications: Prior to Admission medications   Medication Sig Start Date End Date Taking? Authorizing Provider  Ascorbic Acid (VITAMIN C WITH ROSE HIPS) 1000 MG tablet Take 1,000 mg by mouth daily.   Yes Historical Provider, MD  atenolol-chlorthalidone (TENORETIC) 100-25 MG tablet Take 1 tablet by mouth daily.   Yes Historical Provider, MD  b complex vitamins tablet Take 1 tablet by mouth daily.   Yes Historical Provider, MD  Cholecalciferol (VITAMIN D) 2000 UNITS tablet Take 2,000 Units by mouth daily.   Yes Historical Provider, MD  Cyanocobalamin (VITAMIN B-12) 5000 MCG TBDP  Take 5,000 mcg by mouth every 7 (seven) days.   Yes Historical Provider, MD  cyclobenzaprine (FLEXERIL) 10 MG tablet Take 5 mg by mouth at bedtime as needed for muscle spasms (insomnia).   Yes Historical Provider, MD  hydroxypropyl methylcellulose / hypromellose (ISOPTO TEARS / GONIOVISC) 2.5 % ophthalmic solution Place 1 drop into both eyes 4 (four) times daily as needed for dry eyes.   Yes Historical Provider, MD  omega-3 acid ethyl esters (LOVAZA) 1 G capsule Take 1 g by mouth daily.   Yes Historical Provider, MD  milk thistle 175 MG tablet Take 1,000 mg by mouth 2 (two) times daily.    Historical Provider, MD     No family history on file.  Social History   Social History  . Marital Status: Divorced    Spouse Name: N/A  . Number of Children: N/A  . Years of Education: N/A   Social History Main Topics  . Smoking status: Never Smoker   . Smokeless tobacco: Never Used  . Alcohol Use: No  . Drug Use: No  . Sexual Activity: No   Other Topics Concern  . Not on file   Social History Narrative      Review of Systems  Constitutional: Negative for fever and chills.  HENT: Positive for sinus pressure.   Respiratory: Negative  for cough.        Occ dyspnea with exertion  Cardiovascular: Negative for chest pain.  Gastrointestinal: Negative for nausea, vomiting, abdominal pain and blood in stool.  Genitourinary: Positive for hematuria and flank pain. Negative for dysuria.  Musculoskeletal: Positive for back pain.  Neurological: Negative for headaches.  Psychiatric/Behavioral: The patient is nervous/anxious.     Vital Signs: BP 130/93 mmHg  Pulse 72  Temp(Src) 98.1 F (36.7 C) (Oral)  Resp 16  SpO2 98%  Physical Exam  Constitutional: She is oriented to person, place, and time. She appears well-developed and well-nourished.  Cardiovascular: Normal rate and regular rhythm.   Pulmonary/Chest: Effort normal and breath sounds normal.  Abdominal: Soft. Bowel sounds are normal.  There is no tenderness.  obese  Musculoskeletal: Normal range of motion. She exhibits no edema.  Neurological: She is alert and oriented to person, place, and time.    Mallampati Score:     Imaging: No results found.  Labs:  CBC:  Recent Labs  05/14/15 0955  WBC 5.2  HGB 14.5  HCT 42.7  PLT 213    COAGS: No results for input(s): INR, APTT in the last 8760 hours.  BMP:  Recent Labs  05/14/15 0955  NA 138  K 4.1  CL 100*  CO2 28  GLUCOSE 131*  BUN 20  CALCIUM 9.6  CREATININE 1.00  GFRNONAA 58*  GFRAA >60    LIVER FUNCTION TESTS: No results for input(s): BILITOT, AST, ALT, ALKPHOS, PROT, ALBUMIN in the last 8760 hours.  TUMOR MARKERS: No results for input(s): AFPTM, CEA, CA199, CHROMGRNA in the last 8760 hours.  Assessment and Plan: 64 y.o. female with history of nephrolithiasis and now with right staghorn calculus who presents today for right nephroureteral catheter placement prior to planned nephrolithotomy.Risks and benefits discussed with the patient including, but not limited to infection, bleeding, significant bleeding causing loss or decrease in renal function or damage to adjacent structures. All of the patient's questions were answered, patient is agreeable to proceed.Consent signed and in chart.     Thank you for this interesting consult.  I greatly enjoyed meeting Tiffany Odonnell and look forward to participating in their care.  A copy of this report was sent to the requesting provider on this date.  Signed: D. Jeananne Rama 05/31/2015, 8:44 AM   I spent a total of 15 minutes in face to face in clinical consultation, greater than 50% of which was counseling/coordinating care for right nephroureteral catheter placement

## 2015-05-31 NOTE — Progress Notes (Signed)
Floor called after pt pended to 1409, floor reports Charge nurse not aware of pt pending/not ready for pt.

## 2015-05-31 NOTE — Op Note (Signed)
PATIENT:  Tiffany Odonnell  PRE-OPERATIVE DIAGNOSIS: Right full staghorn calculus  POST-OPERATIVE DIAGNOSIS: Same  PROCEDURE: 1. Percutaneous nephrostomy sheath placement.  2.  Right percutaneous nephrolithotomy (4 cm.) first stage of a staged procedure. 3. Antegrade right double-J stent placement. 4. Right nephrostomy tube placement  SURGEON:  Debbrah Sampedro Garnett FarmINDICATION: Tiffany Odonnell is a 64 year old female with a history of distal RTA and kidney stones. She developed right flank pain and intermittent gross hematuria and was found to have a full staghorn involving the entire collecting system as well as renal pelvis of her right kidney. We discussed the treatment options and she has elected to proceed with a percutaneous nephrolithotomy. Her culture grew Proteus and she was therefore placed on antibiotics according to sensitivities and has been on this antibiotic up until the day of her surgery.  ANESTHESIA:  General endotracheal  EBL:  300 mL  DRAINS: 16 French Foley catheter in the bladder, a 6 French/24 cm double-J stent in the right ureter and a 22 [place] council tip latex free catheter as a right nephrostomy tube.  LOCAL MEDICATIONS USED:  None  SPECIMEN:  Stone taken for composition analysis.  Description of procedure: After informed consent the patient was taken to the operating room and administered general endotracheal anesthesia. Once fully anesthetized the patient had an 59 French Foley catheter placed was then moved from the stretcher onto the operating room table in a prone position with bony prominences padded and chest pads in place. The flank with exiting nephrostomy catheter was then sterilely prepped and draped in standard fashion. An official timeout was then performed.  Using the existing nephrostomy catheter access I passed a 0.038 inch floppy tipped guidewire down the ureter into the bladder under fluoroscopy.  This was left in place and the nephrostomy catheter was  removed.  A transverse incision was made over the guidewire and a peel-away coaxial catheter was then passed over the guidewire and down the ureter under fluoroscopy.  The inner portion of the coaxial system was then removed and a second guidewire was passed through this catheter and down the ureter into the bladder under fluoroscopy.  The coaxial catheter was then removed and one of the guidewires was secured to the drape as a safety guidewire and the second guidewire was used as a working guidewire.  The NephroMax nephrostomy dilating balloon was then passed over the working guidewire into the area of the renal pelvis under fluoroscopy.  It was then inflated using dilute contrast under fluoroscopy until the balloon was fully inflated.  I then passed the 28 French nephrostomy access sheath over the balloon into the area of the renal pelvis under fluoroscopy and then deflated the balloon and removed the dilating balloon.  The 33 French rigid nephroscope was then passed under direct vision through the nephrostomy access sheath.  Clotted blood was evacuated and the stone was identified. I used the Music therapist to fragment the stone and extract it. Occasionally I was able to pull out a fragment in order to have a specimen for composition analysis. After I completely fragmented and extracted all visible stone in the area the renal pelvis and visible calyces I then switched to the flexible cystoscope and was able to identify further stone in the upper pole. Based on this knowledge I switched back to the rigid scope and was able to visualize the stone and was able to fragment and completely remove it. At the end of the procedure I repeated flexible cystoscopy and  could not identify any further stone within the kidney although there was some clot which could be obscuring stone material.  I back loaded the rigid nephroscope over the working guidewire and passed the double-J stent into the bladder and as I removed  the guidewire good curl was noted in the bladder. I then used 2 prong graspers to position the stent in the renal pelvis as well. I then measured the depth to the area of the renal pelvis and passed the 22 French council tip catheter through the nephrostomy sheath to the measured depth. With fluoroscopy I filled the balloon with 3 mL of dilute contrast and then removed the safety guidewire as well as the nephrostomy sheath which was incised along its length and removed from the catheter. The nephrostomy catheter was then secured to the skin with a figure-of-eight 2-0 silk suture and a sterile, occlusive dressing was applied. The patient tolerated the procedure well with no intraoperative complications. She was awakened and taken to the recovery room in stable and satisfactory condition.  She will be observed overnight and a CT scan will be obtained to assess for the presence of any further stone fragments with anticipation of discharge in the morning.    PLAN OF CARE: Discharge to home after an overnight stay.  PATIENT DISPOSITION:  PACU - hemodynamically stable.

## 2015-05-31 NOTE — Interval H&P Note (Signed)
History and Physical Interval Note:  05/31/2015 3:53 AM  Varney DailyLydia Odonnell  has presented today for surgery, with the diagnosis of RIGHT STAGHORN STONE   The various methods of treatment have been discussed with the patient and family. After consideration of risks, benefits and other options for treatment, the patient has consented to  Procedure(s): RIGHT PERCUTANEOUS NEPHROLITHOTOMY   (Right) HOLMIUM LASER APPLICATION (Right) as a surgical intervention .  The patient's history has been reviewed, patient examined, no change in status, stable for surgery.  I have reviewed the patient's chart and labs.  Questions were answered to the patient's satisfaction.     Garnett FarmTTELIN,Hester Joslin C

## 2015-05-31 NOTE — Anesthesia Preprocedure Evaluation (Addendum)
Anesthesia Evaluation  Patient identified by MRN, date of birth, ID band Patient awake    Reviewed: Allergy & Precautions, H&P , NPO status , Patient's Chart, lab work & pertinent test results  Airway Mallampati: II  TM Distance: >3 FB Neck ROM: full    Dental no notable dental hx. (+) Dental Advisory Given, Teeth Intact   Pulmonary neg pulmonary ROS, sleep apnea ,  Stop bang 5   Pulmonary exam normal breath sounds clear to auscultation       Cardiovascular Exercise Tolerance: Good hypertension, Pt. on medications and Pt. on home beta blockers Normal cardiovascular exam Rhythm:regular Rate:Normal     Neuro/Psych Vertigo. Peripheral neuropathy  Neuromuscular disease negative psych ROS   GI/Hepatic negative GI ROS, Neg liver ROS, hiatal hernia,   Endo/Other  negative endocrine ROS  Renal/GU negative Renal ROS  negative genitourinary   Musculoskeletal   Abdominal   Peds  Hematology negative hematology ROS (+)   Anesthesia Other Findings Sjogren's     Reproductive/Obstetrics negative OB ROS                          Anesthesia Physical Anesthesia Plan  ASA: III  Anesthesia Plan: General   Post-op Pain Management:    Induction: Intravenous  Airway Management Planned: Oral ETT  Additional Equipment:   Intra-op Plan:   Post-operative Plan: Extubation in OR  Informed Consent: I have reviewed the patients History and Physical, chart, labs and discussed the procedure including the risks, benefits and alternatives for the proposed anesthesia with the patient or authorized representative who has indicated his/her understanding and acceptance.   Dental Advisory Given  Plan Discussed with: CRNA and Surgeon  Anesthesia Plan Comments:         Anesthesia Quick Evaluation

## 2015-05-31 NOTE — Transfer of Care (Signed)
Immediate Anesthesia Transfer of Care Note  Patient: Tiffany Odonnell  Procedure(s) Performed: Procedure(s): RIGHT PERCUTANEOUS NEPHROLITHOTOMY   (Right)  Patient Location: PACU  Anesthesia Type:General  Level of Consciousness: awake, alert , oriented and patient cooperative  Airway & Oxygen Therapy: Patient Spontanous Breathing and Patient connected to face mask oxygen  Post-op Assessment: Report given to RN, Post -op Vital signs reviewed and stable and Patient moving all extremities  Post vital signs: Reviewed and stable  Last Vitals:  Filed Vitals:   05/31/15 0829  BP: 130/93  Pulse: 72  Temp: 36.7 C  Resp: 16    Complications: No apparent anesthesia complications

## 2015-05-31 NOTE — Anesthesia Postprocedure Evaluation (Signed)
Anesthesia Post Note  Patient: Tiffany Odonnell  Procedure(s) Performed: Procedure(s) (LRB): RIGHT PERCUTANEOUS NEPHROLITHOTOMY   (Right)  Patient location during evaluation: PACU Anesthesia Type: General Level of consciousness: awake and alert Pain management: pain level controlled Vital Signs Assessment: post-procedure vital signs reviewed and stable Respiratory status: spontaneous breathing, nonlabored ventilation, respiratory function stable and patient connected to nasal cannula oxygen Cardiovascular status: blood pressure returned to baseline and stable Postop Assessment: no signs of nausea or vomiting Anesthetic complications: no    Last Vitals:  Filed Vitals:   05/31/15 1500 05/31/15 1550  BP: 102/58 98/57  Pulse: 63 77  Temp: 36.4 C 36.4 C  Resp: 15 16    Last Pain:  Filed Vitals:   05/31/15 1726  PainSc: Asleep                 Quron Ruddy L

## 2015-05-31 NOTE — Progress Notes (Signed)
RN called floor to take pt to assigned room 1409.  Secretary reports room is now 1402 and it is not clean.

## 2015-06-01 ENCOUNTER — Encounter (HOSPITAL_COMMUNITY): Payer: Self-pay | Admitting: Urology

## 2015-06-01 DIAGNOSIS — N200 Calculus of kidney: Secondary | ICD-10-CM | POA: Diagnosis not present

## 2015-06-01 LAB — HEMOGLOBIN AND HEMATOCRIT, BLOOD
HCT: 28.9 % — ABNORMAL LOW (ref 36.0–46.0)
Hemoglobin: 10.1 g/dL — ABNORMAL LOW (ref 12.0–15.0)

## 2015-06-01 MED ORDER — OXYCODONE HCL 10 MG PO TABS: 10.0000 mg | ORAL_TABLET | ORAL | Status: DC | PRN

## 2015-06-01 MED ORDER — AMOXICILLIN-POT CLAVULANATE 875-125 MG PO TABS: 1.0000 | ORAL_TABLET | Freq: Two times a day (BID) | ORAL | Status: DC

## 2015-06-01 NOTE — Care Management Note (Signed)
Case Management Note  Patient Details  Name: Tiffany Odonnell MRN: [id number] Date of Birth: 1951/05/13  Subjective/Objective: Referral for paying bills information.  Provided patient w/Consumer Credit resource info,& tel#, also provided w/Low cost dental service info.  Patient very appreciative of services.                   Action/Plan:d/c home no needs or orders.   Expected Discharge Date:                  Expected Discharge Plan:  Home/Self Care  In-House Referral:     Discharge planning Services  CM Consult  Post Acute Care Choice:    Choice offered to:     DME Arranged:    DME Agency:     HH Arranged:    HH Agency:     Status of Service:  Completed, signed off  Medicare Important Message Given:    Date Medicare IM Given:    Medicare IM give by:    Date Additional Medicare IM Given:    Additional Medicare Important Message give by:     If discussed at Long Length of Stay Meetings, dates discussed:    Additional Comments:  Lanier ClamMahabir, Hudson Majkowski, RN 06/01/2015, 12:45 PM

## 2015-06-01 NOTE — Discharge Summary (Signed)
Physician Discharge Summary  Patient ID: Tiffany Odonnell MRN: [id number] DOB/AGE: 1951/05/06 64 y.o.  Admit date: 05/31/2015 Discharge date: 06/01/2015  Admission Diagnoses: RIGHT STAGHORN STONE   Discharge Diagnoses:  Active Problems:   Nephrolithiasis   Staghorn calculus mild blood loss anemia  Discharged Condition: good  Hospital Course: the patient had a full staghorn calculus in the right kidney and also was found to have a Proteus UTI suggesting possible struvite stone.  She was placed on antibiotic therapy preoperatively and taken to the operating room where she underwent a right percutaneous nephrolithotomy without apparent complication.  There was some blood loss intraoperatively but the night of surgery she did not appear to have any significant bleeding and was resting comfortably.  There was minimal out her nephrostomy tube which often times is the case. A follow-up CT scan revealed what appears to be about 3 small very small stone fragments remaining within the kidney that almost certainly would be easily passable.  It also revealed that her nephrostomy tube was malpositioned outside of the kidney.  I therefore removed her nephrostomy tube.  She has a stent in place and I left her Foley catheter in for a short time and then it will be removed prior to her discharge.  She will remain on antibiotic therapy using Augmentin and follow up with me as an outpatient.  Significant Diagnostic Studies: Ct Abdomen Wo Contrast  06/01/2015  CLINICAL DATA:  Status post right nephroureteral catheter placement, prior to planned nephrolithotomy. Evaluate for residual stone fragments. Initial encounter. EXAM: CT ABDOMEN WITHOUT CONTRAST TECHNIQUE: Multidetector CT imaging of the abdomen was performed following the standard protocol without IV contrast. COMPARISON:  None. FINDINGS: Patchy airspace opacity is noted at the lower lobes bilaterally, with underlying bronchiectasis. This may reflect  sequelae of chronic infection. A 7 mm hypodensity near the hepatic dome is nonspecific, but may reflect a small cyst. The spleen is unremarkable in appearance. The gallbladder is within normal limits. The pancreas and adrenal glands are unremarkable. The patient's right-sided nephrostomy catheter is noted ending within the soft tissues posterior to the proximal right ureter in abnormal position, with the balloon distended adjacent to the lower pole of the right kidney. The catheter appears to traverse the edge of the right renal cortex. There is underlying mild urine leak extending inferiorly into the right lower quadrant. The right-sided ureteral stent is noted in expected position. At least seven tiny right renal stones are seen scattered about the renal calyces, measuring up to 3 mm in size. Tiny stones are suggested at the lower pole of the left kidney, measuring up to 2 mm in size. There is mild prominence of the right renal collecting system, without significant hydronephrosis. No free fluid is identified. The small bowel is unremarkable in appearance. The stomach is within normal limits. No acute vascular abnormalities are seen. The appendix is normal in caliber and contains trace air, without evidence of appendicitis. The colon is unremarkable in appearance. The bladder is decompressed, with a Foley catheter in place. The patient is status post hysterectomy. No suspicious adnexal masses are seen. No inguinal lymphadenopathy is seen. No acute osseous abnormalities are identified. IMPRESSION: 1. Right nephrostomy catheter is noted ending within the soft tissues posterior to the proximal right ureter, in abnormal position, with the balloon distended outside of the kidney, adjacent to the lower pole of the right kidney. Catheter appears to traverse the edge of the right renal cortex. Underlying mild urine leak noted extending inferiorly into the  right lower quadrant. 2. Right ureteral stent noted in expected  position. No evidence of hydronephrosis at this time. 3. At least 7 tiny right renal stones are seen scattered about the renal calyces, measuring up to 3 mm in size. 4. Tiny stones suggested at the lower pole of the left kidney, measuring up to 2 mm in size. 5. Patchy airspace opacity at the lower lung lobes bilaterally, with underlying bronchiectasis. This may reflect sequelae of chronic infection. 6. 7 mm hypodensity near the hepatic dome is nonspecific, but may reflect a small cyst. Electronically Signed   By: Roanna Raider M.D.   On: 06/01/2015 03:02   Dg C-arm 61-120 Min-no Report  05/31/2015  CLINICAL DATA: surgery C-ARM 61-120 MINUTES Fluoroscopy was utilized by the requesting physician.  No radiographic interpretation.   Ir Ureteral Stent Right New Access W/o Sep Nephrostomy Cath  05/31/2015  CLINICAL DATA:  Symptomatic right nephrolithiasis, planned percutaneous nephrolithotomy EXAM: RIGHT PERCUTANEOUS NEPHROURETERAL CATHETER PLACEMENT UNDER FLUOROSCOPIC GUIDANCE FLUOROSCOPY TIME:  3.9 min, 403 uGym2 DAP TECHNIQUE: The procedure, risks (including but not limited to bleeding, infection, organ damage ), benefits, and alternatives were explained to the patient. Questions regarding the procedure were encouraged and answered. The patient understands and consents to the procedure. Right flank region prepped with Betadine, draped in usual sterile fashion, infiltrated locally with 1% lidocaine. Intravenous Fentanyl and Versed were administered as conscious sedation during continuous cardiorespiratory monitoring by the radiology RN, with a total moderate sedation time of 12 minutes. Under real-time fluoroscopic guidance, a 21-gauge trocar needle was advanced into a posterior lower pole calyx using the radiodense calculus as a guide. A 018 guidewire advanced easily down the ureter. Needle was exchanged over a guidewire for transitional dilator. Contrast injection confirmed appropriate positioning. Catheter  was exchanged over a guidewire for a 5 [place] Kumpe catheter, advanced into the urinary bladder. Radiograph confirms appropriate nephroureteral catheter positioning. Catheter capped and secured externally. The patient tolerated the procedure well. COMPLICATIONS: COMPLICATIONS None. IMPRESSION: 1. Technically successful right percutaneous nephroureteral catheter placement. Electronically Signed   By: Corlis Leak M.D.   On: 05/31/2015 12:20    Discharge Exam: Blood pressure 100/60, pulse 65, temperature 98.3 F (36.8 C), temperature source Oral, resp. rate 18, height  (1.448 m), weight 79 kg (174 lb 2.6 oz), SpO2 99 %.  She is alert, awake and in no distress. Her abdomen is soft and nontender. Her flank reveals the nephrostomy tube exiting without ecchymoses or drainage/bleeding.  Her nephrostomy tube was removed and the site was dressed with a sterile dressing.   Disposition: Final discharge disposition not confirmed  Discharge Instructions    Discharge patient    Complete by:  As directed             Medication List    TAKE these medications        amoxicillin-clavulanate 875-125 MG tablet  Commonly known as:  AUGMENTIN  Take 1 tablet by mouth 2 (two) times daily.     atenolol-chlorthalidone 100-25 MG tablet  Commonly known as:  TENORETIC  Take 1 tablet by mouth daily.     b complex vitamins tablet  Take 1 tablet by mouth daily.     cyclobenzaprine 10 MG tablet  Commonly known as:  FLEXERIL  Take 5 mg by mouth at bedtime as needed for muscle spasms (insomnia).     hydroxypropyl methylcellulose / hypromellose 2.5 % ophthalmic solution  Commonly known as:  ISOPTO TEARS / GONIOVISC  Place 1  drop into both eyes 4 (four) times daily as needed for dry eyes.     milk thistle 175 MG tablet  Take 1,000 mg by mouth 2 (two) times daily.     omega-3 acid ethyl esters 1 G capsule  Commonly known as:  LOVAZA  Take 1 g by mouth daily.     Oxycodone HCl 10 MG Tabs  Take 1  tablet (10 mg total) by mouth every 4 (four) hours as needed.     Vitamin B-12 5000 MCG Tbdp  Take 5,000 mcg by mouth every 7 (seven) days.     vitamin C with rose hips 1000 MG tablet  Take 1,000 mg by mouth daily.     Vitamin D 2000 UNITS tablet  Take 2,000 Units by mouth daily.           Follow-up Information    Follow up with Garnett Farm, MD On 06/07/2015.   Specialty:  Urology   Why:  For your appiontment at 11:15   Contact information:   [address] [place] [address] [phone]       Signed: Garnett Farm 06/01/2015, 7:00 AM

## 2015-06-01 NOTE — Progress Notes (Signed)
Given prescriptions and dc instructions. Reviewed with pt and pt verbalizes understanding of dc instructions. Maeola Harmanark, Amandamarie Feggins Johnson

## 2015-06-21 ENCOUNTER — Other Ambulatory Visit: Payer: Self-pay | Admitting: Family Medicine

## 2015-06-21 DIAGNOSIS — Z1231 Encounter for screening mammogram for malignant neoplasm of breast: Secondary | ICD-10-CM

## 2015-06-23 ENCOUNTER — Ambulatory Visit: Payer: Medicare Other

## 2015-06-23 ENCOUNTER — Ambulatory Visit
Admission: RE | Admit: 2015-06-23 | Discharge: 2015-06-23 | Disposition: A | Discharge status: Home / Self Care | Payer: Medicare Other | Source: Ambulatory Visit | Attending: Family Medicine | Admitting: Family Medicine

## 2015-06-23 DIAGNOSIS — Z1231 Encounter for screening mammogram for malignant neoplasm of breast: Secondary | ICD-10-CM

## 2015-07-07 ENCOUNTER — Encounter (HOSPITAL_COMMUNITY): Payer: Self-pay | Admitting: Emergency Medicine

## 2015-07-07 ENCOUNTER — Emergency Department (HOSPITAL_COMMUNITY)
Admission: EM | Admit: 2015-07-07 | Discharge: 2015-07-07 | Disposition: A | Discharge status: Home / Self Care | Payer: Medicare Other | Attending: Emergency Medicine | Admitting: Emergency Medicine

## 2015-07-07 DIAGNOSIS — Z79899 Other long term (current) drug therapy: Secondary | ICD-10-CM | POA: Diagnosis not present

## 2015-07-07 DIAGNOSIS — Z8719 Personal history of other diseases of the digestive system: Secondary | ICD-10-CM | POA: Diagnosis not present

## 2015-07-07 DIAGNOSIS — Z87440 Personal history of urinary (tract) infections: Secondary | ICD-10-CM | POA: Insufficient documentation

## 2015-07-07 DIAGNOSIS — Z87442 Personal history of urinary calculi: Secondary | ICD-10-CM | POA: Insufficient documentation

## 2015-07-07 DIAGNOSIS — Z8619 Personal history of other infectious and parasitic diseases: Secondary | ICD-10-CM | POA: Insufficient documentation

## 2015-07-07 DIAGNOSIS — E7800 Pure hypercholesterolemia, unspecified: Secondary | ICD-10-CM | POA: Insufficient documentation

## 2015-07-07 DIAGNOSIS — R51 Headache: Secondary | ICD-10-CM | POA: Insufficient documentation

## 2015-07-07 DIAGNOSIS — M797 Fibromyalgia: Secondary | ICD-10-CM | POA: Insufficient documentation

## 2015-07-07 DIAGNOSIS — Z8669 Personal history of other diseases of the nervous system and sense organs: Secondary | ICD-10-CM | POA: Insufficient documentation

## 2015-07-07 DIAGNOSIS — R112 Nausea with vomiting, unspecified: Secondary | ICD-10-CM | POA: Insufficient documentation

## 2015-07-07 DIAGNOSIS — I10 Essential (primary) hypertension: Secondary | ICD-10-CM | POA: Insufficient documentation

## 2015-07-07 LAB — CBC
HEMATOCRIT: 39.7 % (ref 36.0–46.0)
HEMOGLOBIN: 13.8 g/dL (ref 12.0–15.0)
MCH: 33.6 pg (ref 26.0–34.0)
MCHC: 34.8 g/dL (ref 30.0–36.0)
MCV: 96.6 fL (ref 78.0–100.0)
Platelets: 247 10*3/uL (ref 150–400)
RBC: 4.11 MIL/uL (ref 3.87–5.11)
RDW: 12.5 % (ref 11.5–15.5)
WBC: 5.1 10*3/uL (ref 4.0–10.5)

## 2015-07-07 LAB — COMPREHENSIVE METABOLIC PANEL
ALBUMIN: 4.4 g/dL (ref 3.5–5.0)
ALT: 133 U/L — ABNORMAL HIGH (ref 14–54)
ANION GAP: 14 (ref 5–15)
AST: 115 U/L — ABNORMAL HIGH (ref 15–41)
Alkaline Phosphatase: 76 U/L (ref 38–126)
BILIRUBIN TOTAL: 1.5 mg/dL — AB (ref 0.3–1.2)
BUN: 18 mg/dL (ref 6–20)
CO2: 24 mmol/L (ref 22–32)
Calcium: 10 mg/dL (ref 8.9–10.3)
Chloride: 98 mmol/L — ABNORMAL LOW (ref 101–111)
Creatinine, Ser: 0.89 mg/dL (ref 0.44–1.00)
GFR calc non Af Amer: >60 mL/min (ref >60)
GLUCOSE: 160 mg/dL — AB (ref 65–99)
POTASSIUM: 3.6 mmol/L (ref 3.5–5.1)
Sodium: 136 mmol/L (ref 135–145)
TOTAL PROTEIN: 8.2 g/dL — AB (ref 6.5–8.1)

## 2015-07-07 LAB — LIPASE, BLOOD: Lipase: 49 U/L (ref 11–51)

## 2015-07-07 MED ORDER — ONDANSETRON HCL 4 MG/2ML IJ SOLN
4.0000 mg | Freq: Once | INTRAMUSCULAR | Status: AC | PRN
  Administered 2015-07-07: 4 mg via INTRAVENOUS
  Filled 2015-07-07: qty 2

## 2015-07-07 MED ORDER — SODIUM CHLORIDE 0.9 % IV BOLUS (SEPSIS)
1000.0000 mL | Freq: Once | INTRAVENOUS | Status: AC
  Administered 2015-07-07: 1000 mL via INTRAVENOUS

## 2015-07-07 MED ORDER — ONDANSETRON 4 MG PO TBDP: 4.0000 mg | ORAL_TABLET | Freq: Three times a day (TID) | ORAL | Status: DC | PRN

## 2015-07-07 MED ORDER — ACETAMINOPHEN 325 MG PO TABS
650.0000 mg | ORAL_TABLET | Freq: Once | ORAL | Status: AC
  Administered 2015-07-07: 650 mg via ORAL
  Filled 2015-07-07: qty 2

## 2015-07-07 NOTE — ED Provider Notes (Addendum)
CSN: [id number]     Arrival date & time 07/07/15  0457 History   First MD Initiated Contact with Patient 07/07/15 0515     Chief Complaint  Patient presents with  . Emesis      HPI  She presents for evaluation of vomiting. Sensation of mild headache last night as well as some mild nausea.  At about 1:30 AM she awakened. Nausea. States she put her feet into some warm water, and a cool rag on her head. Her head feels better. However she's had multiple episodes of emesis. No abdominal pain. States it "wretches" when she vomits but no pain. No diarrhea. No fever. States she had normal dinner last night. No ill exposures. No unusual intake.  Past Medical History  Diagnosis Date  . Hypertension   . High cholesterol   . Dizziness   . Numbness and tingling     hands and feet bilat   . Sjogren's disease (HCC)   . Fibromyalgia   . Peripheral neuropathy (HCC)   . Lumbar herniated disc     L5-L6  . History of hiatal hernia   . Blood in urine   . History of frequent urinary tract infections   . Kidney stones   . Wears glasses   . H/O Salmonella gastroenteritis   . Septicemia (HCC)     2013  . Falls   . Cervical disc disorder     bulging disc  . Tinnitus   . Imbalance   . History of vertigo    Past Surgical History  Procedure Laterality Date  . Tubal ligation      1983  . Nasal sinus surgery    . Prolapsed bladder and rectocele surgery     . Abdominal hysterectomy      2014  . Ureteral stent placement      times 2 secondary to kidney stone   . Nephrolithotomy Right 05/31/2015    Procedure: RIGHT PERCUTANEOUS NEPHROLITHOTOMY  ;  Surgeon: Ihor GullyMark Ottelin, MD;  Location: [facility];  Service: Urology;  Laterality: Right;   Family History  Problem Relation Age of Onset  . CAD Mother   . Diabetes Father    Social History  Substance Use Topics  . Smoking status: Never Smoker   . Smokeless tobacco: Never Used  . Alcohol Use: No   OB History    No data available     Review of  Systems  Constitutional: Negative for fever, chills, diaphoresis, appetite change and fatigue.  HENT: Negative for mouth sores, sore throat and trouble swallowing.   Eyes: Negative for visual disturbance.  Respiratory: Negative for cough, chest tightness, shortness of breath and wheezing.   Cardiovascular: Negative for chest pain.  Gastrointestinal: Positive for nausea and vomiting. Negative for abdominal pain, diarrhea and abdominal distention.  Endocrine: Negative for polydipsia, polyphagia and polyuria.  Genitourinary: Negative for dysuria, frequency and hematuria.  Musculoskeletal: Negative for gait problem.  Skin: Negative for color change, pallor and rash.  Neurological: Positive for headaches. Negative for dizziness, syncope and light-headedness.  Hematological: Does not bruise/bleed easily.  Psychiatric/Behavioral: Negative for behavioral problems and confusion.      Allergies  Ciprofloxacin; Macrolides and ketolides; Sulfa antibiotics; and Tetracyclines & related  Home Medications   Prior to Admission medications   Medication Sig Start Date End Date Taking? Authorizing Provider  amoxicillin-clavulanate (AUGMENTIN) 875-125 MG tablet Take 1 tablet by mouth 2 (two) times daily. 06/01/15   Ihor GullyMark Ottelin, MD  Ascorbic Acid (VITAMIN C  WITH ROSE HIPS) 1000 MG tablet Take 1,000 mg by mouth daily.    Historical Provider, MD  atenolol-chlorthalidone (TENORETIC) 100-25 MG tablet Take 1 tablet by mouth daily.    Historical Provider, MD  b complex vitamins tablet Take 1 tablet by mouth daily.    Historical Provider, MD  Cholecalciferol (VITAMIN D) 2000 UNITS tablet Take 2,000 Units by mouth daily.    Historical Provider, MD  Cyanocobalamin (VITAMIN B-12) 5000 MCG TBDP Take 5,000 mcg by mouth every 7 (seven) days.    Historical Provider, MD  cyclobenzaprine (FLEXERIL) 10 MG tablet Take 5 mg by mouth at bedtime as needed for muscle spasms (insomnia).    Historical Provider, MD  hydroxypropyl  methylcellulose / hypromellose (ISOPTO TEARS / GONIOVISC) 2.5 % ophthalmic solution Place 1 drop into both eyes 4 (four) times daily as needed for dry eyes.    Historical Provider, MD  milk thistle 175 MG tablet Take 1,000 mg by mouth 2 (two) times daily.    Historical Provider, MD  omega-3 acid ethyl esters (LOVAZA) 1 G capsule Take 1 g by mouth daily.    Historical Provider, MD  Oxycodone HCl 10 MG TABS Take 1 tablet (10 mg total) by mouth every 4 (four) hours as needed. 06/01/15   Ihor Gully, MD   There were no vitals taken for this visit. Physical Exam  Constitutional: She is oriented to person, place, and time. She appears well-developed and well-nourished. No distress.  HENT:  Head: Normocephalic.  Eyes: Conjunctivae are normal. Pupils are equal, round, and reactive to light. No scleral icterus.  Neck: Normal range of motion. Neck supple. No thyromegaly present.  Cardiovascular: Normal rate and regular rhythm.  Exam reveals no gallop and no friction rub.   No murmur heard. Pulmonary/Chest: Effort normal and breath sounds normal. No respiratory distress. She has no wheezes. She has no rales.  Abdominal: Soft. Bowel sounds are normal. She exhibits no distension. There is no tenderness. There is no rebound.  Normal active bowel sounds. No guarding or rebound. No high-pitched rushes.  Musculoskeletal: Normal range of motion.  Neurological: She is alert and oriented to person, place, and time.  Skin: Skin is warm and dry. No rash noted.  Psychiatric: She has a normal mood and affect. Her behavior is normal.    ED Course  Procedures (including critical care time) Labs Review Labs Reviewed  CBC  LIPASE, BLOOD  COMPREHENSIVE METABOLIC PANEL  URINALYSIS, ROUTINE W REFLEX MICROSCOPIC (NOT AT [facility])    Imaging Review No results found. I have personally reviewed and evaluated these images and lab results as part of my medical decision-making.   EKG Interpretation None      MDM    Final diagnoses:  Non-intractable vomiting with nausea, vomiting of unspecified type    Plan IV fluids, antiemetics. By mouth trial. Tylenol for headache. No concerning history or findings regarding her headache. States it is mild in 1/10. If able to take by mouth, she would be appropriate for discharge.  06:11am:  Pt feeling "better".  Tolerating PO.  Reassuring labs.  Appropriate for DC. Plan clear liquid diet. Slowly advancing. Zofran when necessary. Recheck any worsening symptoms.  Rolland Porter, MD 07/07/15 4098  Rolland Porter, MD 07/07/15 [phone]

## 2015-07-07 NOTE — Discharge Instructions (Signed)

## 2015-07-07 NOTE — ED Notes (Signed)
Pt states she has been vomiting since about 130 this morning  Pt states her stomach hurts when she vomits but not now  Denies diarrhea at this time

## 2017-10-30 ENCOUNTER — Encounter: Payer: Self-pay | Admitting: Gastroenterology

## 2017-12-20 ENCOUNTER — Encounter (INDEPENDENT_AMBULATORY_CARE_PROVIDER_SITE_OTHER): Payer: Self-pay

## 2017-12-20 ENCOUNTER — Encounter: Payer: Self-pay | Admitting: Gastroenterology

## 2017-12-20 ENCOUNTER — Ambulatory Visit (INDEPENDENT_AMBULATORY_CARE_PROVIDER_SITE_OTHER): Payer: Medicare Other | Admitting: Gastroenterology

## 2017-12-20 ENCOUNTER — Other Ambulatory Visit: Payer: Self-pay | Admitting: Gastroenterology

## 2017-12-20 VITALS — BP 164/100 | HR 68 | Ht <= 58 in | Wt 179.0 lb

## 2017-12-20 DIAGNOSIS — K625 Hemorrhage of anus and rectum: Secondary | ICD-10-CM

## 2017-12-20 DIAGNOSIS — K582 Mixed irritable bowel syndrome: Secondary | ICD-10-CM

## 2017-12-20 DIAGNOSIS — M6289 Other specified disorders of muscle: Secondary | ICD-10-CM | POA: Diagnosis not present

## 2017-12-20 DIAGNOSIS — Z1211 Encounter for screening for malignant neoplasm of colon: Secondary | ICD-10-CM

## 2017-12-20 MED ORDER — PLENVU 140 G PO SOLR: 1.0000 | Freq: Once | ORAL | 0 refills | Status: AC

## 2017-12-20 NOTE — Progress Notes (Signed)
Tiffany Odonnell    [id number]    1950/10/20  Primary Care Physician:Reese, Jocelyn Lamer, MD  Referring Physician: Leilani Able, MD [address] [place], [place] [address]  Chief complaint: Irritable bowel syndrome  HPI: 67 year old female here for new patient visit to establish care.  She is originally from [facility], had a colonoscopy about 9 to 10 years ago and was normal according to patient, report is not available.  She had flexible sigmoidoscopy in 2015 prior to hysterectomy, rectocele and cystocele repair at [facility], [place]. She has occasional BRBPR from hemorrhoids.  Chronic irritable bowel syndrome with alternating constipation and diarrhea, most days has 1-2 bowel movements. She has history of fibromyalgia and generalized body aches and myalgia. Denies any dysphagia, nausea, vomiting, abdominal pain, melena, loss of appetite or weight loss. 2 brother with non hodgkins lymphoma, 1 brother had metastaic stage IV cancer unknown primary and 2 sisters with lupus  Outpatient Encounter Medications as of 12/20/2017  Medication Sig  . Ascorbic Acid (VITAMIN C WITH ROSE HIPS) 1000 MG tablet Take 1,000 mg by mouth daily.  Marland Kitchen aspirin EC 81 MG tablet Take 81 mg by mouth daily.  Marland Kitchen atenolol-chlorthalidone (TENORETIC) 100-25 MG tablet Take 1 tablet by mouth daily.  Marland Kitchen b complex vitamins tablet Take 1 tablet by mouth daily.  . Cholecalciferol (VITAMIN D) 2000 UNITS tablet Take 2,000 Units by mouth daily.  . Cyanocobalamin (VITAMIN B-12) 5000 MCG TBDP Take 5,000 mcg by mouth every 7 (seven) days.  . cyclobenzaprine (FLEXERIL) 10 MG tablet Take 5 mg by mouth at bedtime as needed for muscle spasms (insomnia).  . hydroxypropyl methylcellulose / hypromellose (ISOPTO TEARS / GONIOVISC) 2.5 % ophthalmic solution Place 1 drop into both eyes 4 (four) times daily as needed for dry eyes.  . milk thistle 175 MG tablet Take 1,000 mg by mouth 2 (two) times daily.  . NON FORMULARY   . omega-3  acid ethyl esters (LOVAZA) 1 G capsule Take 1 g by mouth daily.  . [DISCONTINUED] amoxicillin-clavulanate (AUGMENTIN) 875-125 MG tablet Take 1 tablet by mouth 2 (two) times daily.  . [DISCONTINUED] ondansetron (ZOFRAN ODT) 4 MG disintegrating tablet Take 1 tablet (4 mg total) by mouth every 8 (eight) hours as needed for nausea.  . [DISCONTINUED] Oxycodone HCl 10 MG TABS Take 1 tablet (10 mg total) by mouth every 4 (four) hours as needed.   No facility-administered encounter medications on file as of 12/20/2017.     Allergies as of 12/20/2017 - Review Complete 12/20/2017  Allergen Reaction Noted  . Ciprofloxacin Hives 05/13/2015  . Macrolides and ketolides Diarrhea and Nausea And Vomiting 05/13/2015  . Sulfa antibiotics  05/13/2015  . Tetracyclines & related Tinitus 05/13/2015    Past Medical History:  Diagnosis Date  . Blood in urine   . Cervical disc disorder    bulging disc  . Dizziness   . Falls   . Fibromyalgia   . H/O Salmonella gastroenteritis   . High cholesterol   . History of frequent urinary tract infections   . History of hiatal hernia   . History of vertigo   . Hypertension   . Imbalance   . Kidney stones   . Lumbar herniated disc    L5-L6  . Numbness and tingling    hands and feet bilat   . Peripheral neuropathy   . Septicemia (HCC)    2013  . Sjogren's disease (HCC)   . Tinnitus   .  Wears glasses     Past Surgical History:  Procedure Laterality Date  . ABDOMINAL HYSTERECTOMY     2014  . NASAL SINUS SURGERY    . NEPHROLITHOTOMY Right 05/31/2015   Procedure: RIGHT PERCUTANEOUS NEPHROLITHOTOMY  ;  Surgeon: Ihor GullyMark Ottelin, MD;  Location: [facility];  Service: Urology;  Laterality: Right;  . prolapsed bladder and rectocele surgery     . TUBAL LIGATION     1983  . URETERAL STENT PLACEMENT     times 2 secondary to kidney stone     Family History  Problem Relation Age of Onset  . CAD Mother   . Diabetes Father     Social History   Socioeconomic History   . Marital status: Divorced    Spouse name: Not on file  . Number of children: Not on file  . Years of education: Not on file  . Highest education level: Not on file  Occupational History  . Not on file  Social Needs  . Financial resource strain: Not on file  . Food insecurity:    Worry: Not on file    Inability: Not on file  . Transportation needs:    Medical: Not on file    Non-medical: Not on file  Tobacco Use  . Smoking status: Never Smoker  . Smokeless tobacco: Never Used  Substance and Sexual Activity  . Alcohol use: No  . Drug use: No  . Sexual activity: Never  Lifestyle  . Physical activity:    Days per week: Not on file    Minutes per session: Not on file  . Stress: Not on file  Relationships  . Social connections:    Talks on phone: Not on file    Gets together: Not on file    Attends religious service: Not on file    Active member of club or organization: Not on file    Attends meetings of clubs or organizations: Not on file    Relationship status: Not on file  . Intimate partner violence:    Fear of current or ex partner: Not on file    Emotionally abused: Not on file    Physically abused: Not on file    Forced sexual activity: Not on file  Other Topics Concern  . Not on file  Social History Narrative  . Not on file      Review of systems: Review of Systems  Constitutional: Negative for fever and chills.  Positive for fatigue HENT: Positive for sinus trouble Eyes: Negative for blurred vision.  Respiratory: Negative for cough, shortness of breath and wheezing.   Cardiovascular: Negative for chest pain and palpitations.  Gastrointestinal: as per HPI Genitourinary: Negative for dysuria, urgency, frequency and hematuria.  Musculoskeletal: Positive for myalgias, back pain and joint pain.  Skin: Negative for itching and rash.  Neurological: Negative for dizziness, tremors, focal weakness, seizures and loss of consciousness.  Endo/Heme/Allergies:  Positive for seasonal allergies.  Psychiatric/Behavioral: Negative for depression, suicidal ideas and hallucinations.  Positive for anxiety All other systems reviewed and are negative.   Physical Exam: Vitals:   12/20/17 0903  BP: (!) 164/100  Pulse: 68   Body mass index is 38.74 kg/m. Gen:      No acute distress HEENT:  EOMI, sclera anicteric Neck:     No masses; no thyromegaly Lungs:    Clear to auscultation bilaterally; normal respiratory effort CV:         Regular rate and rhythm; no murmurs Abd:      +  bowel sounds; soft, non-tender; no palpable masses, no distension Ext:    No edema; adequate peripheral perfusion Skin:      Warm and dry; no rash Neuro: alert and oriented x 3 Psych: normal mood and affect  Data Reviewed:  Reviewed labs, radiology imaging, old records and pertinent past GI work up   Assessment and Plan/Recommendations:  67 year old female with history of fibromyalgia, status post hysterectomy with repair of rectocele and cystocele, chronic irritable bowel syndrome with alternating constipation and diarrhea, intermittent bright red blood per rectum No reports available of prior colonoscopy, per patient it was done about 9-10 years ago in [facility] We will schedule for colonoscopy for colorectal cancer screening The risks and benefits as well as alternatives of endoscopic procedure(s) have been discussed and reviewed. All questions answered. The patient agrees to proceed.   Patient complains of pelvic floor descent and also recurrence of some of her symptoms prior to cystocele repair. Will refer to pelvic floor physical therapy at [facility] urology   Greater than 50% of the time used for counseling as well as treatment plan and follow-up. She had multiple questions which were answered to her satisfaction  K. Scherry Ran , MD [phone]    CC: Leilani Able, MD

## 2017-12-20 NOTE — Patient Instructions (Signed)
You have been scheduled for a colonoscopy. Please follow written instructions given to you at your visit today.  Please pick up your prep supplies at the pharmacy within the next 1-3 days. If you use inhalers (even only as needed), please bring them with you on the day of your procedure. Your physician has requested that you go to www.startemmi.com and enter the access code given to you at your visit today. This web site gives a general overview about your procedure. However, you should still follow specific instructions given to you by our office regarding your preparation for the procedure.  We will refer you to [facility], they will contact you with that appointment

## 2017-12-28 ENCOUNTER — Encounter: Payer: Self-pay | Admitting: Gastroenterology

## 2018-01-16 ENCOUNTER — Ambulatory Visit (AMBULATORY_SURGERY_CENTER): Payer: Medicare Other | Admitting: Gastroenterology

## 2018-01-16 ENCOUNTER — Encounter: Payer: Self-pay | Admitting: Gastroenterology

## 2018-01-16 VITALS — BP 152/80 | HR 55 | Temp 98.6°F | Resp 20 | Ht <= 58 in | Wt 179.0 lb

## 2018-01-16 DIAGNOSIS — K649 Unspecified hemorrhoids: Secondary | ICD-10-CM

## 2018-01-16 DIAGNOSIS — K921 Melena: Secondary | ICD-10-CM

## 2018-01-16 DIAGNOSIS — K625 Hemorrhage of anus and rectum: Secondary | ICD-10-CM

## 2018-01-16 MED ORDER — SODIUM CHLORIDE 0.9 % IV SOLN: 500.0000 mL | Freq: Once | INTRAVENOUS | Status: DC

## 2018-01-16 NOTE — Patient Instructions (Signed)
YOU HAD AN ENDOSCOPIC PROCEDURE TODAY AT THE [facility]:   Refer to the procedure report that was given to you for any specific questions about what was found during the examination.  If the procedure report does not answer your questions, please call your gastroenterologist to clarify.  If you requested that your care partner not be given the details of your procedure findings, then the procedure report has been included in a sealed envelope for you to review at your convenience later.  YOU SHOULD EXPECT: Some feelings of bloating in the abdomen. Passage of more gas than usual.  Walking can help get rid of the air that was put into your GI tract during the procedure and reduce the bloating. If you had a lower endoscopy (such as a colonoscopy or flexible sigmoidoscopy) you may notice spotting of blood in your stool or on the toilet paper. If you underwent a bowel prep for your procedure, you may not have a normal bowel movement for a few days.  Please Note:  You might notice some irritation and congestion in your nose or some drainage.  This is from the oxygen used during your procedure.  There is no need for concern and it should clear up in a day or so.  SYMPTOMS TO REPORT IMMEDIATELY:   Following lower endoscopy (colonoscopy or flexible sigmoidoscopy):  Excessive amounts of blood in the stool  Significant tenderness or worsening of abdominal pains  Swelling of the abdomen that is new, acute  Fever of 100F or higher   Following upper endoscopy (EGD)  Vomiting of blood or coffee ground material  New chest pain or pain under the shoulder blades  Painful or persistently difficult swallowing  New shortness of breath  Fever of 100F or higher  Black, tarry-looking stools  For urgent or emergent issues, a gastroenterologist can be reached at any hour by calling [phone].   DIET:  We do recommend a small meal at first, but then you may proceed to your regular diet.  Drink  plenty of fluids but you should avoid alcoholic beverages for 24 hours.  ACTIVITY:  You should plan to take it easy for the rest of today and you should NOT DRIVE or use heavy machinery until tomorrow (because of the sedation medicines used during the test).    FOLLOW UP: Our staff will call the number listed on your records the next business day following your procedure to check on you and address any questions or concerns that you may have regarding the information given to you following your procedure. If we do not reach you, we will leave a message.  However, if you are feeling well and you are not experiencing any problems, there is no need to return our call.  We will assume that you have returned to your regular daily activities without incident.  If any biopsies were taken you will be contacted by phone or by letter within the next 1-3 weeks.  Please call us at [phone] if you have not heard about the biopsies in 3 weeks.    SIGNATURES/CONFIDENTIALITY: You and/or your care partner have signed paperwork which will be entered into your electronic medical record.  These signatures attest to the fact that that the information above on your After Visit Summary has been reviewed and is understood.  Full responsibility of the confidentiality of this discharge information lies with you and/or your care-partner.   Hemorrhoid and diverticulosis information given.  Benefiber 1 teaspoon three  times daily.

## 2018-01-16 NOTE — Op Note (Signed)
Tiffany Odonnell Patient Name: Tiffany Odonnell Procedure Date: 01/16/2018 3:36 PM MRN: [id number] Endoscopist: Napoleon FormKavitha V. Ayriel Texidor , MD Age: 67 Referring MD:  Date of Birth: 1950/08/08 Gender: Female Account #: [id number] Procedure:                Colonoscopy Indications:              Evaluation of unexplained GI bleeding Medicines:                Monitored Anesthesia Care Procedure:                Pre-Anesthesia Assessment:                           - Prior to the procedure, a History and Physical                            was performed, and patient medications and                            allergies were reviewed. The patient's tolerance of                            previous anesthesia was also reviewed. The risks                            and benefits of the procedure and the sedation                            options and risks were discussed with the patient.                            All questions were answered, and informed consent                            was obtained. Prior Anticoagulants: The patient has                            taken no previous anticoagulant or antiplatelet                            agents. ASA Grade Assessment: II - A patient with                            mild systemic disease. After reviewing the risks                            and benefits, the patient was deemed in                            satisfactory condition to undergo the procedure.                           After obtaining informed consent, the colonoscope  was passed under direct vision. Throughout the                            procedure, the patient's blood pressure, pulse, and                            oxygen saturations were monitored continuously. The                            Model PCF-[id number] ([id number]) scope was introduced                            through the anus and advanced to the the cecum,                            identified by  appendiceal orifice and ileocecal                            valve. The colonoscopy was performed without                            difficulty. The patient tolerated the procedure                            well. The quality of the bowel preparation was                            excellent. The ileocecal valve, appendiceal                            orifice, and rectum were photographed. Scope In: 3:47:11 PM Scope Out: 3:58:16 PM Scope Withdrawal Time: 0 hours 8 minutes 31 seconds  Total Procedure Duration: 0 hours 11 minutes 5 seconds  Findings:                 The perianal and digital rectal examinations were                            normal.                           Multiple small and large-mouthed diverticula were                            found in the sigmoid colon, descending colon and                            transverse colon.                           Non-bleeding internal hemorrhoids were found during                            retroflexion. The hemorrhoids were medium-sized. Complications:            No immediate complications. Estimated Blood Loss:  Estimated blood loss was minimal. Impression:               - Moderate diverticulosis in the sigmoid colon, in                            the descending colon and in the transverse colon.                           - Non-bleeding internal hemorrhoids.                           - No specimens collected. Recommendation:           - Patient has a contact number available for                            emergencies. The signs and symptoms of potential                            delayed complications were discussed with the                            patient. Return to normal activities tomorrow.                            Written discharge instructions were provided to the                            patient.                           - Resume previous diet.                           - Continue present medications.                            - Repeat colonoscopy in 10 years for screening                            purposes.                           - Benefiber 1 teaspoon TID with meals                           - Return to GI clinic for hemorrhoidal band                            ligation as needed Napoleon Form, MD 01/16/2018 4:02:32 PM This report has been signed electronically.

## 2018-01-16 NOTE — Progress Notes (Signed)
Report to PACU, RN, vss, BBS= Clear.  

## 2018-01-17 ENCOUNTER — Telehealth: Payer: Self-pay

## 2018-01-17 NOTE — Telephone Encounter (Signed)
  Follow up Call-  Call Leven Hoel number 01/16/2018  Post procedure Call Amber Williard phone  # [phone]- [phone]  Permission to leave phone message Yes  Some recent data might be hidden     Patient questions:  Do you have a fever, pain , or abdominal swelling? No. Pain Score  0 *  Have you tolerated food without any problems? Yes.    Have you been able to return to your normal activities? Yes.    Do you have any questions about your discharge instructions: Diet   No. Medications  No. Follow up visit  No.  Do you have questions or concerns about your Care? No.  Actions: * If pain score is 4 or above: No action needed, pain <4.

## 2018-02-20 ENCOUNTER — Emergency Department (HOSPITAL_COMMUNITY): Payer: Medicare Other

## 2018-02-20 ENCOUNTER — Emergency Department (HOSPITAL_COMMUNITY)
Admission: EM | Admit: 2018-02-20 | Discharge: 2018-02-20 | Disposition: A | Discharge status: Home / Self Care | Payer: Medicare Other | Attending: Emergency Medicine | Admitting: Emergency Medicine

## 2018-02-20 ENCOUNTER — Encounter (HOSPITAL_COMMUNITY): Payer: Self-pay | Admitting: Emergency Medicine

## 2018-02-20 DIAGNOSIS — I10 Essential (primary) hypertension: Secondary | ICD-10-CM | POA: Insufficient documentation

## 2018-02-20 DIAGNOSIS — N201 Calculus of ureter: Secondary | ICD-10-CM | POA: Insufficient documentation

## 2018-02-20 DIAGNOSIS — R109 Unspecified abdominal pain: Secondary | ICD-10-CM | POA: Diagnosis present

## 2018-02-20 LAB — CBC WITH DIFFERENTIAL/PLATELET
BASOS ABS: 0 10*3/uL (ref 0.0–0.1)
BASOS PCT: 1 %
Eosinophils Absolute: 0.2 10*3/uL (ref 0.0–0.7)
Eosinophils Relative: 3 %
HCT: 43.1 % (ref 36.0–46.0)
Hemoglobin: 14.8 g/dL (ref 12.0–15.0)
Lymphocytes Relative: 31 %
Lymphs Abs: 2.2 10*3/uL (ref 0.7–4.0)
MCH: 32.6 pg (ref 26.0–34.0)
MCHC: 34.3 g/dL (ref 30.0–36.0)
MCV: 94.9 fL (ref 78.0–100.0)
MONO ABS: 0.6 10*3/uL (ref 0.1–1.0)
Monocytes Relative: 9 %
NEUTROS ABS: 4 10*3/uL (ref 1.7–7.7)
Neutrophils Relative %: 56 %
PLATELETS: 243 10*3/uL (ref 150–400)
RBC: 4.54 MIL/uL (ref 3.87–5.11)
RDW: 12.4 % (ref 11.5–15.5)
WBC: 7 10*3/uL (ref 4.0–10.5)

## 2018-02-20 LAB — URINALYSIS, ROUTINE W REFLEX MICROSCOPIC
BILIRUBIN URINE: NEGATIVE
GLUCOSE, UA: NEGATIVE mg/dL
HGB URINE DIPSTICK: NEGATIVE
Ketones, ur: NEGATIVE mg/dL
NITRITE: NEGATIVE
PH: 6 (ref 5.0–8.0)
Protein, ur: NEGATIVE mg/dL
SPECIFIC GRAVITY, URINE: 1.009 (ref 1.005–1.030)

## 2018-02-20 LAB — COMPREHENSIVE METABOLIC PANEL
ALBUMIN: 4.1 g/dL (ref 3.5–5.0)
ALK PHOS: 97 U/L (ref 38–126)
ALT: 30 U/L (ref 0–44)
ANION GAP: 12 (ref 5–15)
AST: 31 U/L (ref 15–41)
BILIRUBIN TOTAL: 0.9 mg/dL (ref 0.3–1.2)
BUN: 21 mg/dL (ref 8–23)
CALCIUM: 9.5 mg/dL (ref 8.9–10.3)
CO2: 23 mmol/L (ref 22–32)
Chloride: 105 mmol/L (ref 98–111)
Creatinine, Ser: 0.93 mg/dL (ref 0.44–1.00)
GFR calc non Af Amer: >60 mL/min (ref >60)
GLUCOSE: 126 mg/dL — AB (ref 70–99)
Potassium: 4.5 mmol/L (ref 3.5–5.1)
Sodium: 140 mmol/L (ref 135–145)
TOTAL PROTEIN: 8 g/dL (ref 6.5–8.1)

## 2018-02-20 MED ORDER — CEPHALEXIN 500 MG PO CAPS: 500.0000 mg | ORAL_CAPSULE | Freq: Three times a day (TID) | ORAL | 0 refills | Status: AC

## 2018-02-20 MED ORDER — KETOROLAC TROMETHAMINE 30 MG/ML IJ SOLN
30.0000 mg | Freq: Once | INTRAMUSCULAR | Status: AC
  Administered 2018-02-20: 30 mg via INTRAVENOUS
  Filled 2018-02-20: qty 1

## 2018-02-20 MED ORDER — SODIUM CHLORIDE 0.9 % IV SOLN
1.0000 g | Freq: Once | INTRAVENOUS | Status: AC
  Administered 2018-02-20: 1 g via INTRAVENOUS
  Filled 2018-02-20: qty 10

## 2018-02-20 MED ORDER — OXYCODONE-ACETAMINOPHEN 5-325 MG PO TABS: 1.0000 | ORAL_TABLET | Freq: Four times a day (QID) | ORAL | 0 refills | Status: DC | PRN

## 2018-02-20 MED ORDER — IBUPROFEN 800 MG PO TABS
800.0000 mg | ORAL_TABLET | Freq: Three times a day (TID) | ORAL | 0 refills | Status: AC | PRN
Start: 2018-02-20 — End: 2018-02-24

## 2018-02-20 NOTE — Discharge Instructions (Addendum)
You have been seen in the Emergency Department (ED) today for pain that we believe based on your workup, is caused by kidney stones.  As we have discussed, please drink plenty of fluids.  Please make a follow up appointment with the physician(s) listed elsewhere in this documentation. ° °You may take pain medication as needed but ONLY as prescribed.  Please also take your prescribed Flomax daily.  We also recommend that you take over-the-counter ibuprofen regularly according to label instructions over the next 5 days.  Take it with meals to minimize stomach discomfort. ° °Please see your doctor as soon as possible as stones may take 1-3 weeks to pass and you may require additional care or medications. ° °Do not drink alcohol, drive or participate in any other potentially dangerous activities while taking opiate pain medication as it may make you sleepy. Do not take this medication with any other sedating medications, either prescription or over-the-counter. If you were prescribed Percocet or Vicodin, do not take these with acetaminophen (Tylenol) as it is already contained within these medications. °  °Take Percocet as needed for severe pain.  This medication is an opiate (or narcotic) pain medication and can be habit forming.  Use it as little as possible to achieve adequate pain control.  Do not use or use it with extreme caution if you have a history of opiate abuse or dependence.  If you are on a pain contract with your primary care doctor or a pain specialist, be sure to let them know you were prescribed this medication today from the Emergency Department.  This medication is intended for your use only - do not give any to anyone else and keep it in a secure place where nobody else, especially children, have access to it.  It will also cause or worsen constipation, so you may want to consider taking an over-the-counter stool softener while you are taking this medication. ° °Return to the Emergency Department  (ED) or call your doctor if you have any worsening pain, fever, painful urination, are unable to urinate, or develop other symptoms that concern you. ° ° ° °Kidney Stones °Kidney stones (urolithiasis) are deposits that form inside your kidneys. The intense pain is caused by the stone moving through the urinary tract. When the stone moves, the ureter goes into spasm around the stone. The stone is usually passed in the urine.  °CAUSES  °A disorder that makes certain neck glands produce too much parathyroid hormone (primary hyperparathyroidism). °A buildup of uric acid crystals, similar to gout in your joints. °Narrowing (stricture) of the ureter. °A kidney obstruction present at birth (congenital obstruction). °Previous surgery on the kidney or ureters. °Numerous kidney infections. °SYMPTOMS  °Feeling sick to your stomach (nauseous). °Throwing up (vomiting). °Blood in the urine (hematuria). °Pain that usually spreads (radiates) to the groin. °Frequency or urgency of urination. °DIAGNOSIS  °Taking a history and physical exam. °Blood or urine tests. °CT scan. °Occasionally, an examination of the inside of the urinary bladder (cystoscopy) is performed. °TREATMENT  °Observation. °Increasing your fluid intake. °Extracorporeal shock wave lithotripsy--This is a noninvasive procedure that uses shock waves to break up kidney stones. °Surgery may be needed if you have severe pain or persistent obstruction. There are various surgical procedures. Most of the procedures are performed with the use of small instruments. Only small incisions are needed to accommodate these instruments, so recovery time is minimized. °The size, location, and chemical composition are all important variables that will determine the proper   choice of action for you. Talk to your health care provider to better understand your situation so that you will minimize the risk of injury to yourself and your kidney.  °HOME CARE INSTRUCTIONS  °Drink enough water  and fluids to keep your urine clear or pale yellow. This will help you to pass the stone or stone fragments. °Strain all urine through the provided strainer. Keep all particulate matter and stones for your health care provider to see. The stone causing the pain may be as small as a grain of salt. It is very important to use the strainer each and every time you pass your urine. The collection of your stone will allow your health care provider to analyze it and verify that a stone has actually passed. The stone analysis will often identify what you can do to reduce the incidence of recurrences. °Only take over-the-counter or prescription medicines for pain, discomfort, or fever as directed by your health care provider. °Keep all follow-up visits as told by your health care provider. This is important. °Get follow-up X-rays if required. The absence of pain does not always mean that the stone has passed. It may have only stopped moving. If the urine remains completely obstructed, it can cause loss of kidney function or even complete destruction of the kidney. It is your responsibility to make sure X-rays and follow-ups are completed. Ultrasounds of the kidney can show blockages and the status of the kidney. Ultrasounds are not associated with any radiation and can be performed easily in a matter of minutes. °Make changes to your daily diet as told by your health care provider. You may be told to: °Limit the amount of salt that you eat. °Eat 5 or more servings of fruits and vegetables each day. °Limit the amount of meat, poultry, fish, and eggs that you eat. °Collect a 24-hour urine sample as told by your health care provider. You may need to collect another urine sample every 6-12 months. °SEEK MEDICAL CARE IF: °You experience pain that is progressive and unresponsive to any pain medicine you have been prescribed. °SEEK IMMEDIATE MEDICAL CARE IF:  °Pain cannot be controlled with the prescribed medicine. °You have a  fever or shaking chills. °The severity or intensity of pain increases over 18 hours and is not relieved by pain medicine. °You develop a new onset of abdominal pain. °You feel faint or pass out. °You are unable to urinate. °  °This information is not intended to replace advice given to you by your health care provider. Make sure you discuss any questions you have with your health care provider. °  °Document Released: 06/19/2005 Document Revised: 03/10/2015 Document Reviewed: 11/20/2012 °Elsevier Interactive Patient Education ©2016 [employer] ° ° °

## 2018-02-20 NOTE — ED Triage Notes (Signed)
Pt reports this right flank pain that started earlier this morning. Believes it is a kidney stone. Denies any urinary problems at this time.

## 2018-02-20 NOTE — ED Provider Notes (Signed)
Emergency Department Provider Note   I have reviewed the triage vital signs and the nursing notes.   HISTORY  Chief Complaint Flank Pain   HPI Veida Spira is a 67 y.o. female with PMH of HTN, Fibromyalgia, multiple prior kidney stones, and Sjogren's presents to the emergency department for evaluation of right flank pain.  Symptoms began at 5 AM this morning and woke the patient from sleep.  She states she had one episode of diarrhea since had pain with urination.  She denies any fevers or chills.  The pain radiates from the right flank to the right anterior abdomen.  Her last kidney stone was in 2016 and required a stent for passage.  She has had staghorn calculi in the past requiring open surgery for removal as well. States this feels similar to prior stones.    Past Medical History:  Diagnosis Date  . Blood in urine   . Cervical disc disorder    bulging disc  . Dizziness   . Falls   . Fibromyalgia   . H/O Salmonella gastroenteritis   . High cholesterol   . History of frequent urinary tract infections   . History of hiatal hernia   . History of vertigo   . Hypertension   . Imbalance   . Kidney stones   . Lumbar herniated disc    L5-L6  . Numbness and tingling    hands and feet bilat   . Peripheral neuropathy   . Septicemia (HCC)    2013  . Sjogren's disease (HCC)   . Tinnitus   . Wears glasses     Patient Active Problem List   Diagnosis Date Noted  . Staghorn calculus 05/31/2015  . Nephrolithiasis     Past Surgical History:  Procedure Laterality Date  . ABDOMINAL HYSTERECTOMY     2014  . NASAL SINUS SURGERY    . NEPHROLITHOTOMY Right 05/31/2015   Procedure: RIGHT PERCUTANEOUS NEPHROLITHOTOMY  ;  Surgeon: Ihor Gully, MD;  Location: [facility];  Service: Urology;  Laterality: Right;  . prolapsed bladder and rectocele surgery     . TUBAL LIGATION     1983  . URETERAL STENT PLACEMENT     times 2 secondary to kidney stone    Allergies Ciprofloxacin;  Macrolides and ketolides; Sulfa antibiotics; and Tetracyclines & related  Family History  Problem Relation Age of Onset  . CAD Mother   . Diabetes Father     Social History Social History   Tobacco Use  . Smoking status: Never Smoker  . Smokeless tobacco: Never Used  Substance Use Topics  . Alcohol use: No  . Drug use: No    Review of Systems  Constitutional: No fever/chills Eyes: No visual changes. ENT: No sore throat. Cardiovascular: Denies chest pain. Respiratory: Denies shortness of breath. Gastrointestinal: No abdominal pain.  No nausea, no vomiting.  No diarrhea.  No constipation. Genitourinary: Negative for dysuria. Positive right flank pain.  Musculoskeletal: Negative for back pain. Skin: Negative for rash. Neurological: Negative for headaches, focal weakness or numbness.  10-point ROS otherwise negative.  ____________________________________________   PHYSICAL EXAM:  VITAL SIGNS: ED Triage Vitals [02/20/18 0832]  Enc Vitals Group     BP (!) 179/113     Pulse Rate 64     Resp 18     Temp 98.4 F (36.9 C)     Temp Source Oral     SpO2 99 %     Pain Score 6   Constitutional:  Alert and oriented. Well appearing and in no acute distress. Eyes: Conjunctivae are normal.  Head: Atraumatic. Nose: No congestion/rhinnorhea. Mouth/Throat: Mucous membranes are moist.  Neck: No stridor.   Cardiovascular: Normal rate, regular rhythm. Good peripheral circulation. Grossly normal heart sounds.   Respiratory: Normal respiratory effort.  No retractions. Lungs CTAB. Gastrointestinal: Soft and nontender. No distention. Positive mild right CVA tenderness to percussion.  Musculoskeletal: No lower extremity tenderness nor edema. No gross deformities of extremities. Neurologic:  Normal speech and language. No gross focal neurologic deficits are appreciated.  Skin:  Skin is warm, dry and intact. No rash noted.  ____________________________________________   LABS (all  labs ordered are listed, but only abnormal results are displayed)  Labs Reviewed  URINALYSIS, ROUTINE W REFLEX MICROSCOPIC - Abnormal; Notable for the following components:      Result Value   APPearance HAZY (*)    Leukocytes, UA LARGE (*)    Bacteria, UA RARE (*)    All other components within normal limits  COMPREHENSIVE METABOLIC PANEL - Abnormal; Notable for the following components:   Glucose, Bld 126 (*)    All other components within normal limits  URINE CULTURE  CBC WITH DIFFERENTIAL/PLATELET   ____________________________________________  RADIOLOGY  Ct Renal Stone Study  Result Date: 02/20/2018 CLINICAL DATA:  Right flank pain.  Started earlier this morning. EXAM: CT ABDOMEN AND PELVIS WITHOUT CONTRAST TECHNIQUE: Multidetector CT imaging of the abdomen and pelvis was performed following the standard protocol without IV contrast. COMPARISON:  05/31/2015 FINDINGS: Lower chest: No acute abnormality. Coronary artery atherosclerosis in the LAD and RCA. Hepatobiliary: No focal liver abnormality is seen. No gallstones, gallbladder wall thickening, or biliary dilatation. Pancreas: Unremarkable. No pancreatic ductal dilatation or surrounding inflammatory changes. Spleen: Normal in size without focal abnormality. Adrenals/Urinary Tract: Normal adrenal glands. Punctate nonobstructing left renal calculi. Multiple punctate right renal calculi. 15 mm right renal calculus. 6 mm right UVJ calculus resulting in mild right hydroureteronephrosis. No left obstructive uropathy. Normal bladder. Stomach/Bowel: Stomach is within normal limits. Appendix appears normal. No evidence of bowel wall thickening, distention, or inflammatory changes. Vascular/Lymphatic: No significant vascular findings are present. No enlarged abdominal or pelvic lymph nodes. Reproductive: Status post hysterectomy. No adnexal masses. Other: No abdominal wall hernia or abnormality. No abdominopelvic ascites. Musculoskeletal: No acute  osseous abnormality. No aggressive osseous lesion. Grade 1 anterolisthesis of L3 on L4 secondary to facet disease. IMPRESSION: 1. 6 mm right UVJ calculus resulting in mild right hydroureteronephrosis. Bilateral nephrolithiasis. Electronically Signed   By: Elige KoHetal  Patel   On: 02/20/2018 09:55    ____________________________________________   PROCEDURES  Procedure(s) performed:   Procedures  None ____________________________________________   INITIAL IMPRESSION / ASSESSMENT AND PLAN / ED COURSE  Pertinent labs & imaging results that were available during my care of the patient were reviewed by me and considered in my medical decision making (see chart for details).  Patient presents to the emergency department for evaluation of right flank pain.  She has had multiple prior kidney stones in the past requiring intervention.  Symptoms began abruptly at 5 AM today. UA pending.  Patient afebrile. Given history plan for CT renal, labs, Toradol for pain, and reassess. Has followed with [facility] in the past.   10:24 AM  Patient with 6mm stone on the right at the UVJ. UA with negative nitrite but leuk esterase and rare bacteria. Question early UTI. Will cover with Rocephin here and bactrim. No concern for urosepsis. Patient will call her Urology office  today to schedule an outpatient appointment. Pain well controlled here.   At this time, I do not feel there is any life-threatening condition present. I have reviewed and discussed all results (EKG, imaging, lab, urine as appropriate), exam findings with patient. I have reviewed nursing notes and appropriate previous records.  I feel the patient is safe to be discharged home without further emergent workup. Discussed usual and customary return precautions. Patient and family (if present) verbalize understanding and are comfortable with this plan.  Patient will follow-up with their primary care provider. If they do not have a primary care  provider, information for follow-up has been provided to them. All questions have been answered.  ____________________________________________  FINAL CLINICAL IMPRESSION(S) / ED DIAGNOSES  Final diagnoses:  Ureterolithiasis  Right flank pain     MEDICATIONS GIVEN DURING THIS VISIT:  Medications  cefTRIAXone (ROCEPHIN) 1 g in sodium chloride 0.9 % 100 mL IVPB (has no administration in time range)  ketorolac (TORADOL) 30 MG/ML injection 30 mg (30 mg Intravenous Given 02/20/18 0858)     NEW OUTPATIENT MEDICATIONS STARTED DURING THIS VISIT:  New Prescriptions   CEPHALEXIN (KEFLEX) 500 MG CAPSULE    Take 1 capsule (500 mg total) by mouth 3 (three) times daily for 7 days.   IBUPROFEN (ADVIL,MOTRIN) 800 MG TABLET    Take 1 tablet (800 mg total) by mouth every 8 (eight) hours as needed for up to 4 days.   OXYCODONE-ACETAMINOPHEN (PERCOCET/ROXICET) 5-325 MG TABLET    Take 1 tablet by mouth every 6 (six) hours as needed for severe pain.    Note:  This document was prepared using Dragon voice recognition software and may include unintentional dictation errors.  Alona BeneJoshua Briggette Najarian, MD Emergency Medicine    Seiji Wiswell, Arlyss RepressJoshua G, MD 02/20/18 1028

## 2018-02-20 NOTE — ED Notes (Signed)
MD at bedside. 

## 2018-02-20 NOTE — ED Notes (Signed)
Pt getting antibiotics IV over 30 minutes and then will be discharged home

## 2018-02-22 LAB — URINE CULTURE: Special Requests: NORMAL

## 2018-02-23 ENCOUNTER — Telehealth: Payer: Self-pay

## 2018-02-23 NOTE — Telephone Encounter (Signed)
Post ED Visit - Positive Culture Follow-up  Culture report reviewed by antimicrobial stewardship pharmacist:  []  Enzo BiNathan Batchelder, Pharm.D. []  Celedonio MiyamotoJeremy Frens, Pharm.D., BCPS AQ-ID []  Garvin FilaMike Maccia, Pharm.D., BCPS []  Georgina PillionElizabeth Martin, Pharm.D., BCPS []  AldenMinh Pham, [address] BoulevardPharm.D., BCPS, AAHIVP []  Estella HuskMichelle Turner, Pharm.D., BCPS, AAHIVP []  Lysle Pearlachel Rumbarger, PharmD, BCPS []  Phillips Climeshuy Dang, PharmD, BCPS []  Agapito GamesAlison Masters, PharmD, BCPS [x]  Verlan FriendsErin Deja, PharmD  Positive urine culture Treated with Cephalexin, organism sensitive to the same and no further patient follow-up is required at this time.  Tiffany CarasCullom, Tiffany Odonnell 02/23/2018, 9:34 AM

## 2018-04-09 ENCOUNTER — Emergency Department (HOSPITAL_COMMUNITY)
Admission: EM | Admit: 2018-04-09 | Discharge: 2018-04-09 | Disposition: A | Discharge status: Home / Self Care | Payer: Medicare Other | Attending: Emergency Medicine | Admitting: Emergency Medicine

## 2018-04-09 ENCOUNTER — Encounter (HOSPITAL_COMMUNITY): Payer: Self-pay

## 2018-04-09 ENCOUNTER — Emergency Department (HOSPITAL_COMMUNITY): Payer: Medicare Other

## 2018-04-09 ENCOUNTER — Other Ambulatory Visit: Payer: Self-pay

## 2018-04-09 DIAGNOSIS — N12 Tubulo-interstitial nephritis, not specified as acute or chronic: Secondary | ICD-10-CM | POA: Insufficient documentation

## 2018-04-09 DIAGNOSIS — Z79899 Other long term (current) drug therapy: Secondary | ICD-10-CM | POA: Diagnosis not present

## 2018-04-09 DIAGNOSIS — N23 Unspecified renal colic: Secondary | ICD-10-CM | POA: Diagnosis not present

## 2018-04-09 DIAGNOSIS — I10 Essential (primary) hypertension: Secondary | ICD-10-CM | POA: Insufficient documentation

## 2018-04-09 DIAGNOSIS — R1030 Lower abdominal pain, unspecified: Secondary | ICD-10-CM | POA: Diagnosis present

## 2018-04-09 LAB — CBC WITH DIFFERENTIAL/PLATELET
Abs Immature Granulocytes: 0.02 10*3/uL (ref 0.00–0.07)
Basophils Absolute: 0.1 10*3/uL (ref 0.0–0.1)
Basophils Relative: 1 %
Eosinophils Absolute: 0.2 10*3/uL (ref 0.0–0.5)
Eosinophils Relative: 3 %
HCT: 44.3 % (ref 36.0–46.0)
HEMOGLOBIN: 14.6 g/dL (ref 12.0–15.0)
Immature Granulocytes: 0 %
LYMPHS PCT: 26 %
Lymphs Abs: 1.6 10*3/uL (ref 0.7–4.0)
MCH: 31.8 pg (ref 26.0–34.0)
MCHC: 33 g/dL (ref 30.0–36.0)
MCV: 96.5 fL (ref 80.0–100.0)
MONO ABS: 0.9 10*3/uL (ref 0.1–1.0)
Monocytes Relative: 15 %
NRBC: 0 % (ref 0.0–0.2)
Neutro Abs: 3.3 10*3/uL (ref 1.7–7.7)
Neutrophils Relative %: 55 %
Platelets: 224 10*3/uL (ref 150–400)
RBC: 4.59 MIL/uL (ref 3.87–5.11)
RDW: 11.9 % (ref 11.5–15.5)
WBC: 6 10*3/uL (ref 4.0–10.5)

## 2018-04-09 LAB — BASIC METABOLIC PANEL
ANION GAP: 9 (ref 5–15)
BUN: 15 mg/dL (ref 8–23)
CO2: 26 mmol/L (ref 22–32)
Calcium: 9.6 mg/dL (ref 8.9–10.3)
Chloride: 107 mmol/L (ref 98–111)
Creatinine, Ser: 0.84 mg/dL (ref 0.44–1.00)
Glucose, Bld: 98 mg/dL (ref 70–99)
POTASSIUM: 4.4 mmol/L (ref 3.5–5.1)
SODIUM: 142 mmol/L (ref 135–145)

## 2018-04-09 LAB — URINALYSIS, ROUTINE W REFLEX MICROSCOPIC
Bilirubin Urine: NEGATIVE
GLUCOSE, UA: NEGATIVE mg/dL
KETONES UR: NEGATIVE mg/dL
Nitrite: NEGATIVE
PROTEIN: NEGATIVE mg/dL
Specific Gravity, Urine: 1.003 — ABNORMAL LOW (ref 1.005–1.030)
pH: 8 (ref 5.0–8.0)

## 2018-04-09 MED ORDER — SODIUM CHLORIDE 0.9 % IV SOLN
INTRAVENOUS | Status: DC | PRN
  Administered 2018-04-09: 500 mL via INTRAVENOUS

## 2018-04-09 MED ORDER — HYDROCODONE-ACETAMINOPHEN 5-325 MG PO TABS: 1.0000 | ORAL_TABLET | Freq: Four times a day (QID) | ORAL | 0 refills | Status: DC | PRN

## 2018-04-09 MED ORDER — MORPHINE SULFATE (PF) 4 MG/ML IV SOLN
4.0000 mg | Freq: Once | INTRAVENOUS | Status: AC
  Administered 2018-04-09: 4 mg via INTRAVENOUS
  Filled 2018-04-09: qty 1

## 2018-04-09 MED ORDER — ONDANSETRON HCL 4 MG/2ML IJ SOLN
4.0000 mg | Freq: Once | INTRAMUSCULAR | Status: AC
  Administered 2018-04-09: 4 mg via INTRAVENOUS
  Filled 2018-04-09: qty 2

## 2018-04-09 MED ORDER — SODIUM CHLORIDE 0.9 % IV SOLN
1.0000 g | Freq: Once | INTRAVENOUS | Status: AC
  Administered 2018-04-09: 1 g via INTRAVENOUS
  Filled 2018-04-09: qty 10

## 2018-04-09 MED ORDER — CEPHALEXIN 500 MG PO CAPS: 500.0000 mg | ORAL_CAPSULE | Freq: Two times a day (BID) | ORAL | 0 refills | Status: AC

## 2018-04-09 NOTE — ED Provider Notes (Signed)
Medical screening examination/treatment/procedure(s) were conducted as a shared visit with non-physician practitioner(s) and myself.  I personally evaluated the patient during the encounter.  None 67 year old female presents with hematuria since yesterday.  Patient concerned that she has worsening renal colic.  Urinalysis shows infection and CT consistent with pyonephritis.  Patient is able to tolerate oral fluids at this time.  Will give dose of IV Rocephin and likely discharge home   Lorre Nick, MD 04/09/18 [phone]

## 2018-04-09 NOTE — ED Provider Notes (Signed)
[facility]-EMERGENCY DEPT Provider Note   CSN: [id number] Arrival date & time: 04/09/18  1344     History   Chief Complaint Chief Complaint  Patient presents with  . Hematuria  . Flank Pain    HPI Deanne Bedgood is a 67 y.o. female with PMH/o fibromyalgia, hypertension, kidney stones, peripheral neuropathy, Sjogren's who presents for evaluation of hematuria and lower back pain that began yesterday. She also reports some discomfort with urination.  She reports she has a history of kidney stones and states that her current symptoms feel similar to when she has passed kidney stones in the past.  She reports her last kidney stone was in August 2019.  She followed up with Dr. Vernie Ammons (urology) on 03/21/18 stated that the kidney stone had not moved.  She has an upcoming appointment with him on 04/16/2018 to see if she would need a lithotripsy or nephrostomy tube for correction.  Patient reports that she always has some lower back discomfort but states that it became more constant more severe yesterday.  She is stated that the pain waxes and wanes in intensity.  She did take some oral pain medications at home with minimal improvement.  She states that the pain radiates to the front of her abdomen.  She has not had any nausea/vomiting she has been able to eat and drink without any difficulty.  Patient states she has not had any fevers.  Patient states she has peripheral neuropathy at baseline but denies any changes in her numbness/weakness.  She denies any chest pain, difficulty breathing.   The history is provided by the patient.    Past Medical History:  Diagnosis Date  . Blood in urine   . Cervical disc disorder    bulging disc  . Dizziness   . Falls   . Fibromyalgia   . H/O Salmonella gastroenteritis   . High cholesterol   . History of frequent urinary tract infections   . History of hiatal hernia   . History of vertigo   . Hypertension   . Imbalance   . Kidney  stones   . Lumbar herniated disc    L5-L6  . Numbness and tingling    hands and feet bilat   . Peripheral neuropathy   . Septicemia (HCC)    2013  . Sjogren's disease (H[facility])   . Tinnitus   . Wears glasses     Patient Active Problem List   Diagnosis Date Noted  . Staghorn calculus 05/31/2015  . Nephrolithiasis     Past Surgical History:  Procedure Laterality Date  . ABDOMINAL HYSTERECTOMY     2014  . NASAL SINUS SURGERY    . NEPHROLITHOTOMY Right 05/31/2015   Procedure: RIGHT PERCUTANEOUS NEPHROLITHOTOMY  ;  Surgeon: Ihor Gully, MD;  Location: [facility];  Service: Urology;  Laterality: Right;  . prolapsed bladder and rectocele surgery     . TUBAL LIGATION     1983  . URETERAL STENT PLACEMENT     times 2 secondary to kidney stone      OB History   None      Home Medications    Prior to Admission medications   Medication Sig Start Date End Date Taking? Authorizing Provider  atenolol (TENORMIN) 100 MG tablet Take 100 mg by mouth daily. 01/30/18  Yes [provider]  betamethasone dipropionate 0.05 % lotion Apply 1 application topically 2 (two) times daily as needed. For scalp 04/03/18  Yes [provider]  cyclobenzaprine (FLEXERIL) 10 MG tablet Take 5 mg by mouth daily as needed for muscle spasms.   Yes [provider]  desonide (DESOWEN) 0.05 % cream Apply 1 application topically 2 (two) times daily as needed. flares 04/03/18  Yes [provider]  tamsulosin (FLOMAX) 0.4 MG CAPS capsule Take 0.4 mg by mouth daily.   Yes [provider]  cephALEXin (KEFLEX) 500 MG capsule Take 1 capsule (500 mg total) by mouth 2 (two) times daily for 7 days. 04/09/18 04/16/18  Maxwell Caul, PA-C  HYDROcodone-acetaminophen (NORCO/VICODIN) 5-325 MG tablet Take 1-2 tablets by mouth every 6 (six) hours as needed. 04/09/18   Maxwell Caul, PA-C  oxyCODONE-acetaminophen (PERCOCET/ROXICET) 5-325 MG tablet Take 1 tablet by mouth every 6 (six) hours  as needed for severe pain. Patient not taking: Reported on 04/09/2018 02/20/18   Long, Arlyss Repress, MD    Family History Family History  Problem Relation Age of Onset  . CAD Mother   . Diabetes Father     Social History Social History   Tobacco Use  . Smoking status: Never Smoker  . Smokeless tobacco: Never Used  Substance Use Topics  . Alcohol use: No  . Drug use: No     Allergies   Ciprofloxacin; Macrolides and ketolides; Sulfa antibiotics; and Tetracyclines & related   Review of Systems Review of Systems  Constitutional: Negative for fever.  Respiratory: Negative for cough and shortness of breath.   Cardiovascular: Negative for chest pain.  Gastrointestinal: Negative for abdominal pain, nausea and vomiting.  Genitourinary: Positive for dysuria, flank pain and hematuria.  Neurological: Negative for weakness, numbness and headaches.  All other systems reviewed and are negative.    Physical Exam Updated Vital Signs BP (!) 173/84   Pulse 74   Temp 98.2 F (36.8 C) (Oral)   Resp 15   Ht 5' (1.524 m)   Wt 77.1 kg   SpO2 100%   BMI 33.20 kg/m   Physical Exam  Constitutional: She is oriented to person, place, and time. She appears well-developed and well-nourished.  Appears uncomfortable but no acute distress   HENT:  Head: Normocephalic and atraumatic.  Mouth/Throat: Oropharynx is clear and moist and mucous membranes are normal.  Eyes: Pupils are equal, round, and reactive to light. Conjunctivae, EOM and lids are normal.  Neck: Full passive range of motion without pain.  Cardiovascular: Normal rate, regular rhythm, normal heart sounds and normal pulses. Exam reveals no gallop and no friction rub.  No murmur heard. Pulmonary/Chest: Effort normal and breath sounds normal.  Abdominal: Soft. Normal appearance. There is tenderness in the right lower quadrant, suprapubic area and left lower quadrant. There is CVA tenderness (L>R). There is no rigidity and no guarding.    Abdomen is soft, nondistended.  Tenderness noted to the lower abdomen diffusely.  No focal point.  CVA tenderness bilaterally but left greater than right.  Musculoskeletal: Normal range of motion.  Neurological: She is alert and oriented to person, place, and time.  Follows commands, Moves all extremities  5/5 strength to BUE and BLE  Sensation intact throughout all major nerve distributions  Skin: Skin is warm and dry. Capillary refill takes less than 2 seconds.  Psychiatric: She has a normal mood and affect. Her speech is normal.  Nursing note and vitals reviewed.    ED Treatments / Results  Labs (all labs ordered are listed, but only abnormal results are displayed) Labs Reviewed  URINALYSIS, ROUTINE W REFLEX MICROSCOPIC - Abnormal;  Notable for the following components:      Result Value   APPearance HAZY (*)    Specific Gravity, Urine 1.003 (*)    Hgb urine dipstick LARGE (*)    Leukocytes, UA LARGE (*)    WBC, UA >50 (*)    Bacteria, UA FEW (*)    All other components within normal limits  URINE CULTURE  BASIC METABOLIC PANEL  CBC WITH DIFFERENTIAL/PLATELET    EKG None  Radiology Ct Renal Stone Study  Result Date: 04/09/2018 CLINICAL DATA:  Flank pain. EXAM: CT ABDOMEN AND PELVIS WITHOUT CONTRAST TECHNIQUE: Multidetector CT imaging of the abdomen and pelvis was performed following the standard protocol without IV contrast. COMPARISON:  CT scan of February 20, 2018. FINDINGS: Lower chest: No acute abnormality. Hepatobiliary: No focal liver abnormality is seen. No gallstones, gallbladder wall thickening, or biliary dilatation. Pancreas: Unremarkable. No pancreatic ductal dilatation or surrounding inflammatory changes. Spleen: Normal in size without focal abnormality. Adrenals/Urinary Tract: Adrenal glands appear normal. Bilateral nephrolithiasis is noted. Largest calculus measures 14 mm in lower pole collecting system of right kidney. No hydronephrosis or renal obstruction is  noted. No ureteral calculi are noted. Urinary bladder is unremarkable. Stomach/Bowel: Stomach is within normal limits. Appendix appears normal. No evidence of bowel wall thickening, distention, or inflammatory changes. Vascular/Lymphatic: No significant vascular findings are present. No enlarged abdominal or pelvic lymph nodes. Reproductive: Status post hysterectomy. No adnexal masses. Other: No abdominal wall hernia or abnormality. No abdominopelvic ascites. Musculoskeletal: No acute or significant osseous findings. IMPRESSION: Bilateral nonobstructive nephrolithiasis. No hydronephrosis or renal obstruction is noted. No other abnormality seen in the abdomen or pelvis. Electronically Signed   By: Lupita Raider, M.D.   On: 04/09/2018 16:23    Procedures Procedures (including critical care time)  Medications Ordered in ED Medications  ondansetron (ZOFRAN) injection 4 mg (4 mg Intravenous Given 04/09/18 1629)  morphine 4 MG/ML injection 4 mg (4 mg Intravenous Given 04/09/18 1629)  cefTRIAXone (ROCEPHIN) 1 g in sodium chloride 0.9 % 100 mL IVPB (0 g Intravenous Stopped 04/09/18 1832)     Initial Impression / Assessment and Plan / ED Course  I have reviewed the triage vital signs and the nursing notes.  Pertinent labs & imaging results that were available during my care of the patient were reviewed by me and considered in my medical decision making (see chart for details).     67 year old female past medical history of fibromyalgia, peripheral neuropathy, kidney stones who presents for evaluation of hematuria and flank pain that began yesterday.  Notes she has kidney stones and is seen by urology.  She last saw them last month and was told she had a stone that was not moving and is scheduled to see them on 04/16/2018 for evaluation of lithotripsy versus nephrostomy tube.  No fevers, nausea/vomiting.  No new numbness/weakness. Patient is afebril, non-toxic appearing, sitting comfortably on examination  table. Vital signs reviewed and stable.  Patient is slightly hypertensive.  Likely secondary to pain.  Will reassess.  On exam, patient left greater than right CVA tenderness.  Consider kidney stone versus infectious process.  History/physical exam is not concerning for acute spinal process, aortic dissection.  Urine ordered at triage.  Will plan for basic labs, analgesics.  UA shows large hemoglobin, large leukocytes, greater than 50 white blood cell counts and WBC clumps present.  CBC with no significant leukocytosis or anemia.  She has bilateral nonobstructing nephrolithiasis.  Largest stone is approximate 14 mm.  No evidence of obstructing stone.  Will treat for Pilo given CVA tenderness in urine.  Patient has had Rocephin in the past without any difficulty.  Review of records show that in August 2018, she had a CT scan that showed a 6 mm stone at [facility].  She reports when she saw Dr. Vernie Ammons last time, the stones will not pass.  She may have recently passed stone.  Given UA and CVA tenderness, concern for pyonephritis.  Rocephin given in the ED.  Plan to send home with antibiotics.  Review of patient on her [place] PMP.  She received a narcotic prescription on 02/20/18.  No other narcotic prescriptions.  She has a appointment scheduled with Dr. Vernie Ammons on 04/16/2018.   Discussed results with patient.  She reports improvement in pain after being here in the ED and given analgesics.  Vital signs are stable.  She is still slightly hypertensive.  She does have a history of hypertension.  Instructed patient follow-up with Dr. Vernie Ammons as directed.  Patient instructed to take antibiotics as directed. Patient had ample opportunity for questions and discussion. All patient's questions were answered with full understanding. Strict return precautions discussed. Patient expresses understanding and agreement to plan.   Final Clinical Impressions(s) / ED Diagnoses   Final diagnoses:  Pyelonephritis  Renal colic    ED  Discharge Orders         Ordered    cephALEXin (KEFLEX) 500 MG capsule  2 times daily     04/09/18 1822    HYDROcodone-acetaminophen (NORCO/VICODIN) 5-325 MG tablet  Every 6 hours PRN     04/09/18 1822           Rosana Hoes 04/09/18 2353    Lorre Nick, MD 04/10/18 1344

## 2018-04-09 NOTE — ED Triage Notes (Signed)
Patient c/o hematuria since yesterday. Patient has a known kidney stone. Patient states she tried calling Dr. Margrett Rud office, but no appointment until 04/16/18.  Patient does have right flank pain.

## 2018-04-09 NOTE — Discharge Instructions (Signed)
Take antibiotics as directed. Please take all of your antibiotics until finished.  Take pain medication for severe or breakthrough pain.  As we discussed, follow-up with Dr. Vernie Ammons as you previously have scheduled.  Return to the emergency department for any worsening pain, fever, persistent nausea/vomiting or any other worsening or concerning symptoms.

## 2018-04-12 LAB — URINE CULTURE
Culture: >=100000 — AB
Special Requests: NORMAL

## 2018-04-13 ENCOUNTER — Telehealth: Payer: Self-pay

## 2018-04-13 NOTE — Telephone Encounter (Signed)
Post ED Visit - Positive Culture Follow-up  Culture report reviewed by antimicrobial stewardship pharmacist:  []  Enzo Bi, Pharm.D. []  Celedonio Miyamoto, Pharm.D., BCPS AQ-ID []  Garvin Fila, Pharm.D., BCPS []  Georgina Pillion, [address].D., BCPS []  [place], [place].D., BCPS, AAHIVP []  Estella Husk, Pharm.D., BCPS, AAHIVP []  Lysle Pearl, PharmD, BCPS []  Phillips Climes, PharmD, BCPS []  Agapito Games, PharmD, BCPS [x]  Verlan Friends, PharmD  Positive urine culture Treated with Cephalexin, organism sensitive to the same and no further patient follow-up is required at this time.  Jerry Caras 04/13/2018, 10:58 AM

## 2019-04-15 ENCOUNTER — Other Ambulatory Visit: Payer: Self-pay | Admitting: Family Medicine

## 2019-04-15 DIAGNOSIS — Z1231 Encounter for screening mammogram for malignant neoplasm of breast: Secondary | ICD-10-CM

## 2019-04-25 ENCOUNTER — Other Ambulatory Visit: Payer: Self-pay

## 2019-04-25 ENCOUNTER — Emergency Department (HOSPITAL_COMMUNITY)
Admission: EM | Admit: 2019-04-25 | Discharge: 2019-04-25 | Discharge status: Against Medical Advice | Payer: Medicare Other | Attending: Emergency Medicine | Admitting: Emergency Medicine

## 2019-04-25 ENCOUNTER — Encounter (HOSPITAL_COMMUNITY): Payer: Self-pay

## 2019-04-25 DIAGNOSIS — Z5321 Procedure and treatment not carried out due to patient leaving prior to being seen by health care provider: Secondary | ICD-10-CM | POA: Insufficient documentation

## 2019-04-25 DIAGNOSIS — M79644 Pain in right finger(s): Secondary | ICD-10-CM | POA: Diagnosis present

## 2019-04-25 NOTE — ED Triage Notes (Signed)
Pt coming from home c/o laceration to right middle finger after accidentally cutting herself with a butter knife while cooking on Tuesday. Bleeding controlled. Been putting antibiotic ointment on it.

## 2019-10-20 ENCOUNTER — Ambulatory Visit: Payer: Self-pay | Admitting: Orthopedic Surgery

## 2019-10-20 NOTE — H&P (Signed)
Tiffany Odonnell is an 69 y.o. female.   Chief Complaint: L knee pain HPI: For associated symptoms, patient reports grinding. For location, she reports left and knee. For duration, she reports 5 weeks. For severity, she reports pain level 0/10. For alleviating factors, she reports otc medication (tylenol), nsaids (advil), and cortisone injection. For aggravating factors, she reports twisting. Still has pain and guarding. She reports the pain gets fairly severe if she bends or moves or tries to flex the knee occasional popping  Past Medical History:  Diagnosis Date  . Blood in urine   . Cervical disc disorder    bulging disc  . Dizziness   . Falls   . Fibromyalgia   . H/O Salmonella gastroenteritis   . High cholesterol   . History of frequent urinary tract infections   . History of hiatal hernia   . History of vertigo   . Hypertension   . Imbalance   . Kidney stones   . Lumbar herniated disc    L5-L6  . Numbness and tingling    hands and feet bilat   . Peripheral neuropathy   . Septicemia ([place])    2013  . Sjogren's disease ([place])   . Tinnitus   . Wears glasses     Past Surgical History:  Procedure Laterality Date  . ABDOMINAL HYSTERECTOMY     2014  . NASAL SINUS SURGERY    . NEPHROLITHOTOMY Right 05/31/2015   Procedure: RIGHT PERCUTANEOUS NEPHROLITHOTOMY  ;  Surgeon: Kathie Rhodes, MD;  Location: [facility];  Service: Urology;  Laterality: Right;  . prolapsed bladder and rectocele surgery     . TUBAL LIGATION     1983  . URETERAL STENT PLACEMENT     times 2 secondary to kidney stone     Family History  Problem Relation Age of Onset  . CAD Mother   . Diabetes Father    Social History:  reports that she has never smoked. She has never used smokeless tobacco. She reports that she does not drink alcohol or use drugs.  Allergies:  Allergies  Allergen Reactions  . Ciprofloxacin Hives  . Macrolides And Ketolides Diarrhea and Nausea And Vomiting  . Sulfa Antibiotics Other  (See Comments)    Trembling, back pain, red eyes  . Tetracyclines & Related Tinitus    Vertigo    (Not in a hospital admission)   No results found for this or any previous visit (from the past 48 hour(s)). No results found.  Review of Systems  Constitutional: Negative.   HENT: Negative.   Eyes: Negative.   Respiratory: Negative.   Cardiovascular: Negative.   Gastrointestinal: Negative.   Endocrine: Negative.   Genitourinary: Negative.   Musculoskeletal: Positive for arthralgias and joint swelling.  Neurological: Negative.   Hematological: Negative.   Psychiatric/Behavioral: Negative.     There were no vitals taken for this visit. Physical Exam  Constitutional: She is oriented to person, place, and time. She appears well-developed and well-nourished.  HENT:  Head: Normocephalic.  Eyes: Pupils are equal, round, and reactive to light.  Cardiovascular: Normal rate.  Respiratory: Effort normal.  GI: Soft.  Musculoskeletal:     Cervical back: Normal range of motion.     Comments: Constitutional General Appearance: healthy-appearing, NAD  Gait and Station Appearance: antalgic gait  Cardiovascular System Arterial Pulses Left: femoral normal, popliteal normal, dorsalis pedis normal, posterior tibialis normal Edema Left: no edema Varicosities Left: no varicosities  Lymph Nodes Inspection/Palpation Left: no inguinal LAD  Knees Inspection Left: no deformity, swelling Bony Palpation Left: no tenderness of the superior pole patella, no tenderness of the inferior pole patella, no tenderness of the tibial tubercle, no tenderness of the medial tibial plateau, no tenderness of Gerdy's tubercle, no tenderness of the neck of fibula, tenderness of the medial joint line Soft Tissue Palpation Left: no tenderness of the quadriceps tendon, no tenderness of the prepatellar bursa, no tenderness of the patellar tendon, no tenderness of the medial collateral ligament, no tenderness of the  infrapatellar tendon Active Range of Motion Left: limited Stability Left: no laxity, no ligamentous instability, anterior drawer sign negative, Lachman test negative Strength Left: flexion 5/5, extension 5/5, no hamstring weakness, no quadriceps weakness  Skin Left Lower Extremity: normal  Neurologic Ankle Reflex Left: normal (2) Knee Reflex Left: normal (2) Sensation on the Left: L2 normal, L3 normal, L4 normal, L5 normal, S1 normal  Psychiatric Mood and Affect: active and alert, normal mood  Neurological: She is alert and oriented to person, place, and time.  Skin: Skin is warm and dry.    Left knee MRI MRI demonstrates complex tear of the posterior horn of medial meniscus including a radial tear spanning the entire AP with adjacent to the root. Small meniscus tear in the body. 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 Moderate patellofemoral arthrosis  Assessment/Plan Patient demonstrates symptomatic complex tear of medial meniscus of the left knee. Small radial tear of the lateral meniscus. Underlying mild to moderate osteoarthritis. X-rays indicating well-maintained joint space  Plan: We discussed just living with her symptoms versus knee arthroscopy and partial meniscectomy and debridement. She would like to proceed with the latter  I discussed the risk and benefits of knee arthroscopy including no changes in their symptoms worsening in their symptoms DVT, PE, anesthetic complications etc. Surgical possibilities include chondroplasty, microfracture, partial meniscectomy, plica excision etc. We also discussed the possible need for repeat arthroscopy in the future as well as possible continued treatment including corticosteroid injections and possible Visco supplementation. In addition we discussed the possibility of even eventually required a total knee replacement if significant arthritis  encountered. Also indicating that it is an outpatient procedure. 1-2 days on crutches. Postoperative DVT prophylaxis with aspirin if tolerated. Follow-up in the office in 2 weeks following the surgery. Possible consideration of formal of supervised physical therapy as well.   She is okay for capsule. Okay for a postoperative aspirin. Percocet. She would prefer using a walker. No history of MRSA.   Plan L knee scope, partial medial and lateral meniscectomies, debridement  Dorothy Spark, PA-C for Dr. Shelle Iron 10/20/2019, 9:33 AM

## 2019-10-20 NOTE — H&P (View-Only) (Signed)
Tiffany Odonnell is an 68 y.o. female.   Chief Complaint: L knee pain HPI: For associated symptoms, patient reports grinding. For location, she reports left and knee. For duration, she reports 5 weeks. For severity, she reports pain level 0/10. For alleviating factors, she reports otc medication (tylenol), nsaids (advil), and cortisone injection. For aggravating factors, she reports twisting. Still has pain and guarding. She reports the pain gets fairly severe if she bends or moves or tries to flex the knee occasional popping  Past Medical History:  Diagnosis Date  . Blood in urine   . Cervical disc disorder    bulging disc  . Dizziness   . Falls   . Fibromyalgia   . H/O Salmonella gastroenteritis   . High cholesterol   . History of frequent urinary tract infections   . History of hiatal hernia   . History of vertigo   . Hypertension   . Imbalance   . Kidney stones   . Lumbar herniated disc    L5-L6  . Numbness and tingling    hands and feet bilat   . Peripheral neuropathy   . Septicemia (HCC)    2013  . Sjogren's disease (HCC)   . Tinnitus   . Wears glasses     Past Surgical History:  Procedure Laterality Date  . ABDOMINAL HYSTERECTOMY     2014  . NASAL SINUS SURGERY    . NEPHROLITHOTOMY Right 05/31/2015   Procedure: RIGHT PERCUTANEOUS NEPHROLITHOTOMY  ;  Surgeon: Mark Ottelin, MD;  Location: [facility];  Service: Urology;  Laterality: Right;  . prolapsed bladder and rectocele surgery     . TUBAL LIGATION     1983  . URETERAL STENT PLACEMENT     times 2 secondary to kidney stone     Family History  Problem Relation Age of Onset  . CAD Mother   . Diabetes Father    Social History:  reports that she has never smoked. She has never used smokeless tobacco. She reports that she does not drink alcohol or use drugs.  Allergies:  Allergies  Allergen Reactions  . Ciprofloxacin Hives  . Macrolides And Ketolides Diarrhea and Nausea And Vomiting  . Sulfa Antibiotics Other  (See Comments)    Trembling, back pain, red eyes  . Tetracyclines & Related Tinitus    Vertigo    (Not in a hospital admission)   No results found for this or any previous visit (from the past 48 hour(s)). No results found.  Review of Systems  Constitutional: Negative.   HENT: Negative.   Eyes: Negative.   Respiratory: Negative.   Cardiovascular: Negative.   Gastrointestinal: Negative.   Endocrine: Negative.   Genitourinary: Negative.   Musculoskeletal: Positive for arthralgias and joint swelling.  Neurological: Negative.   Hematological: Negative.   Psychiatric/Behavioral: Negative.     There were no vitals taken for this visit. Physical Exam  Constitutional: She is oriented to person, place, and time. She appears well-developed and well-nourished.  HENT:  Head: Normocephalic.  Eyes: Pupils are equal, round, and reactive to light.  Cardiovascular: Normal rate.  Respiratory: Effort normal.  GI: Soft.  Musculoskeletal:     Cervical back: Normal range of motion.     Comments: Constitutional General Appearance: healthy-appearing, NAD  Gait and Station Appearance: antalgic gait  Cardiovascular System Arterial Pulses Left: femoral normal, popliteal normal, dorsalis pedis normal, posterior tibialis normal Edema Left: no edema Varicosities Left: no varicosities  Lymph Nodes Inspection/Palpation Left: no inguinal LAD    Knees Inspection Left: no deformity, swelling Bony Palpation Left: no tenderness of the superior pole patella, no tenderness of the inferior pole patella, no tenderness of the tibial tubercle, no tenderness of the medial tibial plateau, no tenderness of Gerdy's tubercle, no tenderness of the neck of fibula, tenderness of the medial joint line Soft Tissue Palpation Left: no tenderness of the quadriceps tendon, no tenderness of the prepatellar bursa, no tenderness of the patellar tendon, no tenderness of the medial collateral ligament, no tenderness of the  infrapatellar tendon Active Range of Motion Left: limited Stability Left: no laxity, no ligamentous instability, anterior drawer sign negative, Lachman test negative Strength Left: flexion 5/5, extension 5/5, no hamstring weakness, no quadriceps weakness  Skin Left Lower Extremity: normal  Neurologic Ankle Reflex Left: normal (2) Knee Reflex Left: normal (2) Sensation on the Left: L2 normal, L3 normal, L4 normal, L5 normal, S1 normal  Psychiatric Mood and Affect: active and alert, normal mood  Neurological: She is alert and oriented to person, place, and time.  Skin: Skin is warm and dry.    Left knee MRI MRI demonstrates complex tear of the posterior horn of medial meniscus including a radial tear spanning the entire AP with adjacent to the root. Small meniscus tear in the body. 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 Moderate patellofemoral arthrosis  Assessment/Plan Patient demonstrates symptomatic complex tear of medial meniscus of the left knee. Small radial tear of the lateral meniscus. Underlying mild to moderate osteoarthritis. X-rays indicating well-maintained joint space  Plan: We discussed just living with her symptoms versus knee arthroscopy and partial meniscectomy and debridement. She would like to proceed with the latter  I discussed the risk and benefits of knee arthroscopy including no changes in their symptoms worsening in their symptoms DVT, PE, anesthetic complications etc. Surgical possibilities include chondroplasty, microfracture, partial meniscectomy, plica excision etc. We also discussed the possible need for repeat arthroscopy in the future as well as possible continued treatment including corticosteroid injections and possible Visco supplementation. In addition we discussed the possibility of even eventually required a total knee replacement if significant arthritis  encountered. Also indicating that it is an outpatient procedure. 1-2 days on crutches. Postoperative DVT prophylaxis with aspirin if tolerated. Follow-up in the office in 2 weeks following the surgery. Possible consideration of formal of supervised physical therapy as well.   She is okay for capsule. Okay for a postoperative aspirin. Percocet. She would prefer using a walker. No history of MRSA.   Plan L knee scope, partial medial and lateral meniscectomies, debridement  Dorothy Spark, PA-C for Dr. Shelle Iron 10/20/2019, 9:33 AM

## 2019-10-21 NOTE — Progress Notes (Signed)
PCP - Betti Reese, MD Cardiologist -   Chest x-ray -  EKG -  Stress Test -  ECHO -  Cardiac Cath -   Sleep Study -  CPAP -   Fasting Blood Sugar -  Checks Blood Sugar _____ times a day  Blood Thinner Instructions: Aspirin Instructions: Last Dose:  Anesthesia review:   Patient denies shortness of breath, fever, cough and chest pain at PAT appointment   Patient verbalized understanding of instructions that were given to them at the PAT appointment. Patient was also instructed that they will need to review over the PAT instructions again at home before surgery. 

## 2019-10-21 NOTE — Patient Instructions (Addendum)
DUE TO COVID-19 ONLY ONE VISITOR IS ALLOWED TO COME WITH YOU AND STAY IN THE WAITING ROOM ONLY DURING PRE OP AND PROCEDURE DAY OF SURGERY. THE 1 VISITOR MAY VISIT WITH YOU AFTER SURGERY IN YOUR PRIVATE ROOM DURING VISITING HOURS ONLY!  YOU NEED TO HAVE A COVID 19 TEST ON 10-25-19@ 10:25 AM, THIS TEST MUST BE DONE BEFORE SURGERY, COME  [address], [place] , [address].  [facility]) ONCE YOUR COVID TEST IS COMPLETED, PLEASE BEGIN THE QUARANTINE INSTRUCTIONS AS OUTLINED IN YOUR HANDOUT.                Tiffany Odonnell  10/21/2019   Your procedure is scheduled on: 10-29-19   Report to [facility] Main  Entrance    Report to Admitting at 7:30 AM     Call this number if you have problems the morning of surgery [phone]    Remember: NO SOLID FOOD AFTER MIDNIGHT THE NIGHT PRIOR TO SURGERY. NOTHING BY MOUTH EXCEPT CLEAR LIQUIDS UNTIL 7:30 AM . PLEASE FINISH ENSURE DRINK PER SURGEON ORDER  WHICH NEEDS TO BE COMPLETED AT 7:30 AM.   CLEAR LIQUID DIET   Foods Allowed                                                                     Foods Excluded  Coffee and tea, regular and decaf                             liquids that you cannot  Plain Jell-O any favor except red or purple                                           see through such as: Fruit ices (not with fruit pulp)                                     milk, soups, orange juice  Iced Popsicles                                    All solid food Carbonated beverages, regular and diet                                    Cranberry, grape and apple juices Sports drinks like Gatorade Lightly seasoned clear broth or consume(fat free) Sugar, honey syrup   _____________________________________________________________________     Take these medicines the morning of surgery with A SIP OF WATER: Atenolol (Tenormin). You may also use your eyedrops  BRUSH YOUR TEETH MORNING OF SURGERY AND RINSE YOUR MOUTH OUT, NO  CHEWING GUM CANDY OR MINTS.                                You may not have any metal on your  body including hair pins and              piercings     Do not wear jewelry, make-up, lotions, powders or perfumes, deodorant              Do not wear nail polish on your fingernails.  Do not shave  48 hours prior to surgery.                Do not bring valuables to the hospital. [place].  Contacts, dentures or bridgework may not be worn into surgery.       Patients discharged the day of surgery will not be allowed to drive home. IF YOU ARE HAVING SURGERY AND GOING HOME THE SAME DAY, YOU MUST HAVE AN ADULT TO DRIVE YOU HOME AND BE WITH YOU FOR 24 HOURS. YOU MAY GO HOME BY TAXI OR UBER OR ORTHERWISE, BUT AN ADULT MUST ACCOMPANY YOU HOME AND STAY WITH YOU FOR 24 HOURS.  Name and phone number of your driver:Tiffany Odonnell [phone] or Tiffany Odonnell [phone]  Special Instructions: N/A              Please read over the following fact sheets you were given: _____________________________________________________________________             [facility] - Preparing for Surgery Before surgery, you can play an important role.  Because skin is not sterile, your skin needs to be as free of germs as possible.  You can reduce the number of germs on your skin by washing with CHG (chlorahexidine gluconate) soap before surgery.  CHG is an antiseptic cleaner which kills germs and bonds with the skin to continue killing germs even after washing. Please DO NOT use if you have an allergy to CHG or antibacterial soaps.  If your skin becomes reddened/irritated stop using the CHG and inform your nurse when you arrive at Short Stay. Do not shave (including legs and underarms) for at least 48 hours prior to the first CHG shower.  You may shave your face/neck. Please follow these instructions carefully:  1.  Shower with CHG Soap the night before surgery and  the  morning of Surgery.  2.  If you choose to wash your hair, wash your hair first as usual with your  normal  shampoo.  3.  After you shampoo, rinse your hair and body thoroughly to remove the  shampoo.                           4.  Use CHG as you would any other liquid soap.  You can apply chg directly  to the skin and wash                       Gently with a scrungie or clean washcloth.  5.  Apply the CHG Soap to your body ONLY FROM THE NECK DOWN.   Do not use on face/ open                           Wound or open sores. Avoid contact with eyes, ears mouth and genitals (private parts).                       Wash face,  Genitals (private parts) with your normal soap.             6.  Wash thoroughly, paying special attention to the area where your surgery  will be performed.  7.  Thoroughly rinse your body with warm water from the neck down.  8.  DO NOT shower/wash with your normal soap after using and rinsing off  the CHG Soap.                9.  Pat yourself dry with a clean towel.            10.  Wear clean pajamas.            11.  Place clean sheets on your bed the night of your first shower and do not  sleep with pets. Day of Surgery : Do not apply any lotions/deodorants the morning of surgery.  Please wear clean clothes to the hospital/surgery center.  FAILURE TO FOLLOW THESE INSTRUCTIONS MAY RESULT IN THE CANCELLATION OF YOUR SURGERY PATIENT SIGNATURE_________________________________  NURSE SIGNATURE__________________________________  ________________________________________________________________________   Tiffany Odonnell  An incentive spirometer is a tool that can help keep your lungs clear and active. This tool measures how well you are filling your lungs with each breath. Taking long deep breaths may help reverse or decrease the chance of developing breathing (pulmonary) problems (especially infection) following:  A long period of time when you are unable to move or be  active. BEFORE THE PROCEDURE   If the spirometer includes an indicator to show your best effort, your nurse or respiratory therapist will set it to a desired goal.  If possible, sit up straight or lean slightly forward. Try not to slouch.  Hold the incentive spirometer in an upright position. INSTRUCTIONS FOR USE  1. Sit on the edge of your bed if possible, or sit up as far as you can in bed or on a chair. 2. Hold the incentive spirometer in an upright position. 3. Breathe out normally. 4. Place the mouthpiece in your mouth and seal your lips tightly around it. 5. Breathe in slowly and as deeply as possible, raising the piston or the ball toward the top of the column. 6. Hold your breath for 3-5 seconds or for as long as possible. Allow the piston or ball to fall to the bottom of the column. 7. Remove the mouthpiece from your mouth and breathe out normally. 8. Rest for a few seconds and repeat Steps 1 through 7 at least 10 times every 1-2 hours when you are awake. Take your time and take a few normal breaths between deep breaths. 9. The spirometer may include an indicator to show your best effort. Use the indicator as a goal to work toward during each repetition. 10. After each set of 10 deep breaths, practice coughing to be sure your lungs are clear. If you have an incision (the cut made at the time of surgery), support your incision when coughing by placing a pillow or rolled up towels firmly against it. Once you are able to get out of bed, walk around indoors and cough well. You may stop using the incentive spirometer when instructed by your caregiver.  RISKS AND COMPLICATIONS  Take your time so you do not get dizzy or light-headed.  If you are in pain, you may need to take or ask for pain medication before doing incentive spirometry. It is harder to take a deep breath if you are having pain. AFTER USE  Rest and breathe slowly and easily.  It can be helpful to keep track of a log of  your progress. Your caregiver can provide you with a simple table to help with this. If you are using the spirometer at home, follow these instructions: [place] IF:   You are having difficultly using the spirometer.  You have trouble using the spirometer as often as instructed.  Your pain medication is not giving enough relief while using the spirometer.  You develop fever of 100.5 F (38.1 C) or higher. SEEK IMMEDIATE MEDICAL CARE IF:   You cough up bloody sputum that had not been present before.  You develop fever of 102 F (38.9 C) or greater.  You develop worsening pain at or near the incision site. MAKE SURE YOU:   Understand these instructions.  Will watch your condition.  Will get help right away if you are not doing well or get worse. Document Released: 10/30/2006 Document Revised: 09/11/2011 Document Reviewed: 12/31/2006 [facility] Patient Information 2014 [place], [place].   ________________________________________________________________________

## 2019-10-24 ENCOUNTER — Other Ambulatory Visit: Payer: Self-pay

## 2019-10-24 ENCOUNTER — Encounter (HOSPITAL_COMMUNITY): Payer: Self-pay

## 2019-10-24 ENCOUNTER — Encounter (INDEPENDENT_AMBULATORY_CARE_PROVIDER_SITE_OTHER): Payer: Self-pay

## 2019-10-24 ENCOUNTER — Encounter (HOSPITAL_COMMUNITY)
Admission: RE | Admit: 2019-10-24 | Discharge: 2019-10-24 | Disposition: A | Discharge status: Home / Self Care | Payer: Medicare Other | Source: Ambulatory Visit | Attending: Specialist | Admitting: Specialist

## 2019-10-24 DIAGNOSIS — I10 Essential (primary) hypertension: Secondary | ICD-10-CM | POA: Diagnosis not present

## 2019-10-24 DIAGNOSIS — Z01812 Encounter for preprocedural laboratory examination: Secondary | ICD-10-CM | POA: Insufficient documentation

## 2019-10-24 DIAGNOSIS — Z01810 Encounter for preprocedural cardiovascular examination: Secondary | ICD-10-CM | POA: Insufficient documentation

## 2019-10-24 LAB — BASIC METABOLIC PANEL
Anion gap: 10 (ref 5–15)
BUN: 27 mg/dL — ABNORMAL HIGH (ref 8–23)
CO2: 29 mmol/L (ref 22–32)
Calcium: 9.8 mg/dL (ref 8.9–10.3)
Chloride: 99 mmol/L (ref 98–111)
Creatinine, Ser: 1 mg/dL (ref 0.44–1.00)
GFR calc Af Amer: >60 mL/min (ref >60)
GFR calc non Af Amer: 58 mL/min — ABNORMAL LOW (ref >60)
Glucose, Bld: 109 mg/dL — ABNORMAL HIGH (ref 70–99)
Potassium: 4.6 mmol/L (ref 3.5–5.1)
Sodium: 138 mmol/L (ref 135–145)

## 2019-10-24 LAB — CBC WITH DIFFERENTIAL/PLATELET
Abs Immature Granulocytes: 0.02 10*3/uL (ref 0.00–0.07)
Basophils Absolute: 0.1 10*3/uL (ref 0.0–0.1)
Basophils Relative: 1 %
Eosinophils Absolute: 0.1 10*3/uL (ref 0.0–0.5)
Eosinophils Relative: 1 %
HCT: 43.5 % (ref 36.0–46.0)
Hemoglobin: 14.3 g/dL (ref 12.0–15.0)
Immature Granulocytes: 0 %
Lymphocytes Relative: 26 %
Lymphs Abs: 2.7 10*3/uL (ref 0.7–4.0)
MCH: 31.8 pg (ref 26.0–34.0)
MCHC: 32.9 g/dL (ref 30.0–36.0)
MCV: 96.7 fL (ref 80.0–100.0)
Monocytes Absolute: 1.3 10*3/uL — ABNORMAL HIGH (ref 0.1–1.0)
Monocytes Relative: 13 %
Neutro Abs: 6.1 10*3/uL (ref 1.7–7.7)
Neutrophils Relative %: 59 %
Platelets: 250 10*3/uL (ref 150–400)
RBC: 4.5 MIL/uL (ref 3.87–5.11)
RDW: 11.9 % (ref 11.5–15.5)
WBC: 10.3 10*3/uL (ref 4.0–10.5)
nRBC: 0 % (ref 0.0–0.2)

## 2019-10-25 ENCOUNTER — Other Ambulatory Visit (HOSPITAL_COMMUNITY)
Admission: RE | Admit: 2019-10-25 | Discharge: 2019-10-25 | Disposition: A | Discharge status: Home / Self Care | Payer: Medicare Other | Source: Ambulatory Visit | Attending: Specialist | Admitting: Specialist

## 2019-10-25 DIAGNOSIS — Z20822 Contact with and (suspected) exposure to covid-19: Secondary | ICD-10-CM | POA: Insufficient documentation

## 2019-10-25 DIAGNOSIS — Z01812 Encounter for preprocedural laboratory examination: Secondary | ICD-10-CM | POA: Insufficient documentation

## 2019-10-26 LAB — SARS CORONAVIRUS 2 (TAT 6-24 HRS): SARS Coronavirus 2: NEGATIVE

## 2019-10-28 ENCOUNTER — Encounter (HOSPITAL_COMMUNITY): Payer: Self-pay | Admitting: Specialist

## 2019-10-29 ENCOUNTER — Ambulatory Visit (HOSPITAL_COMMUNITY)
Admission: RE | Admit: 2019-10-29 | Discharge: 2019-10-29 | Disposition: A | Discharge status: Home / Self Care | Payer: Medicare Other | Attending: Specialist | Admitting: Specialist

## 2019-10-29 ENCOUNTER — Encounter (HOSPITAL_COMMUNITY)
Admission: RE | Disposition: A | Discharge status: Home / Self Care | Payer: Self-pay | Source: Home / Self Care | Attending: Specialist

## 2019-10-29 ENCOUNTER — Ambulatory Visit (HOSPITAL_COMMUNITY): Payer: Medicare Other | Admitting: Anesthesiology

## 2019-10-29 ENCOUNTER — Other Ambulatory Visit: Payer: Self-pay

## 2019-10-29 ENCOUNTER — Encounter (HOSPITAL_COMMUNITY): Payer: Self-pay | Admitting: Specialist

## 2019-10-29 DIAGNOSIS — Z515 Encounter for palliative care: Secondary | ICD-10-CM

## 2019-10-29 DIAGNOSIS — M94262 Chondromalacia, left knee: Secondary | ICD-10-CM | POA: Diagnosis not present

## 2019-10-29 DIAGNOSIS — S83282A Other tear of lateral meniscus, current injury, left knee, initial encounter: Secondary | ICD-10-CM | POA: Insufficient documentation

## 2019-10-29 DIAGNOSIS — X58XXXA Exposure to other specified factors, initial encounter: Secondary | ICD-10-CM | POA: Insufficient documentation

## 2019-10-29 DIAGNOSIS — I10 Essential (primary) hypertension: Secondary | ICD-10-CM | POA: Diagnosis not present

## 2019-10-29 DIAGNOSIS — M1712 Unilateral primary osteoarthritis, left knee: Secondary | ICD-10-CM

## 2019-10-29 DIAGNOSIS — S83232A Complex tear of medial meniscus, current injury, left knee, initial encounter: Secondary | ICD-10-CM | POA: Diagnosis not present

## 2019-10-29 HISTORY — PX: KNEE ARTHROSCOPY WITH MEDIAL MENISECTOMY: SHX5651

## 2019-10-29 SURGERY — ARTHROSCOPY, KNEE, WITH MEDIAL MENISCECTOMY
Anesthesia: General | Site: Knee | Laterality: Left

## 2019-10-29 MED ORDER — FENTANYL CITRATE (PF) 100 MCG/2ML IJ SOLN
INTRAMUSCULAR | Status: DC | PRN
  Administered 2019-10-29 (×2): 25 ug via INTRAVENOUS
  Administered 2019-10-29: 50 ug via INTRAVENOUS

## 2019-10-29 MED ORDER — OXYCODONE HCL 5 MG PO TABS: 5.0000 mg | ORAL_TABLET | Freq: Once | ORAL | Status: DC | PRN

## 2019-10-29 MED ORDER — CEFAZOLIN SODIUM-DEXTROSE 2-4 GM/100ML-% IV SOLN
2.0000 g | INTRAVENOUS | Status: AC
  Administered 2019-10-29: 10:00:00 2 g via INTRAVENOUS
  Filled 2019-10-29: qty 100

## 2019-10-29 MED ORDER — GLYCOPYRROLATE 0.2 MG/ML IJ SOLN
INTRAMUSCULAR | Status: DC | PRN
  Administered 2019-10-29: .2 mg via INTRAVENOUS

## 2019-10-29 MED ORDER — PROPOFOL 10 MG/ML IV BOLUS
INTRAVENOUS | Status: DC | PRN
  Administered 2019-10-29: 150 mg via INTRAVENOUS

## 2019-10-29 MED ORDER — OXYCODONE HCL 5 MG/5ML PO SOLN: 5.0000 mg | Freq: Once | ORAL | Status: DC | PRN

## 2019-10-29 MED ORDER — MIDAZOLAM HCL 2 MG/2ML IJ SOLN
INTRAMUSCULAR | Status: AC
  Filled 2019-10-29: qty 2

## 2019-10-29 MED ORDER — GLYCOPYRROLATE PF 0.2 MG/ML IJ SOSY
PREFILLED_SYRINGE | INTRAMUSCULAR | Status: AC
  Filled 2019-10-29: qty 1

## 2019-10-29 MED ORDER — MIDAZOLAM HCL 5 MG/5ML IJ SOLN
INTRAMUSCULAR | Status: DC | PRN
  Administered 2019-10-29: 1 mg via INTRAVENOUS

## 2019-10-29 MED ORDER — DEXAMETHASONE SODIUM PHOSPHATE 4 MG/ML IJ SOLN
INTRAMUSCULAR | Status: DC | PRN
  Administered 2019-10-29: 8 mg via INTRAVENOUS

## 2019-10-29 MED ORDER — BUPIVACAINE-EPINEPHRINE (PF) 0.5% -1:200000 IJ SOLN
INTRAMUSCULAR | Status: AC
  Filled 2019-10-29: qty 30

## 2019-10-29 MED ORDER — EPHEDRINE 5 MG/ML INJ
INTRAVENOUS | Status: AC
  Filled 2019-10-29: qty 20

## 2019-10-29 MED ORDER — BUPIVACAINE-EPINEPHRINE 0.5% -1:200000 IJ SOLN
INTRAMUSCULAR | Status: DC | PRN
  Administered 2019-10-29: 20 mL

## 2019-10-29 MED ORDER — LACTATED RINGERS IV SOLN
INTRAVENOUS | Status: DC | PRN
  Administered 2019-10-29 (×2): 1 mL

## 2019-10-29 MED ORDER — ONDANSETRON HCL 4 MG/2ML IJ SOLN
INTRAMUSCULAR | Status: DC | PRN
  Administered 2019-10-29: 4 mg via INTRAVENOUS

## 2019-10-29 MED ORDER — DOCUSATE SODIUM 100 MG PO CAPS
100.0000 mg | ORAL_CAPSULE | Freq: Two times a day (BID) | ORAL | 2 refills | Status: AC
Start: 2019-10-29 — End: 2019-11-12

## 2019-10-29 MED ORDER — POVIDONE-IODINE 7.5 % EX SOLN: Freq: Once | CUTANEOUS | Status: DC

## 2019-10-29 MED ORDER — ASPIRIN EC 81 MG PO TBEC: 81.0000 mg | DELAYED_RELEASE_TABLET | Freq: Every day | ORAL | 1 refills | Status: AC

## 2019-10-29 MED ORDER — FENTANYL CITRATE (PF) 100 MCG/2ML IJ SOLN
INTRAMUSCULAR | Status: AC
  Filled 2019-10-29: qty 2

## 2019-10-29 MED ORDER — EPHEDRINE SULFATE 50 MG/ML IJ SOLN
INTRAMUSCULAR | Status: DC | PRN
Start: 2019-10-29 — End: 2019-10-29
  Administered 2019-10-29 (×3): 10 mg via INTRAVENOUS

## 2019-10-29 MED ORDER — LIDOCAINE HCL (CARDIAC) PF 100 MG/5ML IV SOSY
PREFILLED_SYRINGE | INTRAVENOUS | Status: DC | PRN
  Administered 2019-10-29: 80 mg via INTRAVENOUS

## 2019-10-29 MED ORDER — FENTANYL CITRATE (PF) 100 MCG/2ML IJ SOLN: 25.0000 ug | INTRAMUSCULAR | Status: DC | PRN

## 2019-10-29 MED ORDER — HYDROCODONE-ACETAMINOPHEN 5-325 MG PO TABS: 1.0000 | ORAL_TABLET | ORAL | 0 refills | Status: DC | PRN

## 2019-10-29 MED ORDER — LACTATED RINGERS IV SOLN: INTRAVENOUS | Status: DC

## 2019-10-29 SURGICAL SUPPLY — 27 items
ABLATOR ASPIRATE 50D MULTI-PRT (SURGICAL WAND) IMPLANT
BLADE SHAVER TORPEDO 4X13 (MISCELLANEOUS) IMPLANT
BNDG CONFORM 6X.82 1P STRL (GAUZE/BANDAGES/DRESSINGS) ×2 IMPLANT
BNDG ELASTIC 6X10 VLCR STRL LF (GAUZE/BANDAGES/DRESSINGS) ×2 IMPLANT
BNDG ELASTIC 6X5.8 VLCR STR LF (GAUZE/BANDAGES/DRESSINGS) ×2 IMPLANT
BOOTIES KNEE HIGH SLOAN (MISCELLANEOUS) ×4 IMPLANT
COVER SURGICAL LIGHT HANDLE (MISCELLANEOUS) ×2 IMPLANT
COVER WAND RF STERILE (DRAPES) ×2 IMPLANT
DRSG PAD ABDOMINAL 8X10 ST (GAUZE/BANDAGES/DRESSINGS) ×2 IMPLANT
DURAPREP 26ML APPLICATOR (WOUND CARE) ×2 IMPLANT
GAUZE SPONGE 4X4 12PLY STRL (GAUZE/BANDAGES/DRESSINGS) ×2 IMPLANT
GLOVE BIOGEL PI IND STRL 7.5 (GLOVE) ×1 IMPLANT
GLOVE BIOGEL PI INDICATOR 7.5 (GLOVE) ×1
GLOVE SURG SS PI 7.5 STRL IVOR (GLOVE) ×2 IMPLANT
GLOVE SURG SS PI 8.0 STRL IVOR (GLOVE) ×2 IMPLANT
GOWN STRL REUS W/TWL XL LVL3 (GOWN DISPOSABLE) ×4 IMPLANT
KIT TURNOVER KIT A (KITS) IMPLANT
MANIFOLD NEPTUNE II (INSTRUMENTS) ×2 IMPLANT
PACK ARTHROSCOPY WL (CUSTOM PROCEDURE TRAY) ×2 IMPLANT
PADDING CAST COTTON 6X4 STRL (CAST SUPPLIES) ×2 IMPLANT
PENCIL SMOKE EVACUATOR (MISCELLANEOUS) IMPLANT
PORT APPOLLO RF 90DEGREE MULTI (SURGICAL WAND) IMPLANT
SUT ETHILON 4 0 PS 2 18 (SUTURE) ×2 IMPLANT
TOWEL OR 17X26 10 PK STRL BLUE (TOWEL DISPOSABLE) ×2 IMPLANT
TUBING ARTHROSCOPY IRRIG 16FT (MISCELLANEOUS) ×2 IMPLANT
WIPE CHG CHLORHEXIDINE 2% (PERSONAL CARE ITEMS) ×2 IMPLANT
WRAP KNEE MAXI GEL POST OP (GAUZE/BANDAGES/DRESSINGS) ×2 IMPLANT

## 2019-10-29 NOTE — Transfer of Care (Signed)
Immediate Anesthesia Transfer of Care Note  Patient: Tiffany Odonnell  Procedure(s) Performed: Procedure(s) with comments: KNEE ARTHROSCOPY WITH MEDIAL AND LATERAL MENISECTOMY WITH DEBRIDEMENT (Left) - 60 mins  Patient Location: PACU  Anesthesia Type:General  Level of Consciousness:  sedated, patient cooperative and responds to stimulation  Airway & Oxygen Therapy:Patient Spontanous Breathing and Patient connected to face mask oxgen  Post-op Assessment:  Report given to PACU RN and Post -op Vital signs reviewed and stable  Post vital signs:  Reviewed and stable  Last Vitals:  Vitals:   10/29/19 0812 10/29/19 1057  BP: (!) 146/77 134/80  Pulse: 62 88  Resp: 18 12  Temp: 36.9 C (P) 37 C  SpO2: 98% 98%    Complications: No apparent anesthesia complications

## 2019-10-29 NOTE — Op Note (Signed)
[name], Tiffany Odonnell MEDICAL RECORD LN:[id number] ACCOUNT [id number] DATE OF BIRTH:05/29/1951 FACILITY: [facility] LOCATION: [facility] PHYSICIAN:Larose Batres Connye Burkitt, MD  OPERATIVE REPORT  DATE OF PROCEDURE:  10/29/2019  PREOPERATIVE DIAGNOSES:   1.  Medial meniscus tear, left knee.   2.  Chondromalacia.   3.  Small tear of lateral meniscus.  POSTOPERATIVE DIAGNOSES:   1.  Medial meniscus tear, left knee.   2.  Chondromalacia.   3.  Small tear of lateral meniscus.  PROCEDURES PERFORMED: 1.  Left knee arthroscopy. 2.  Partial medial and lateral meniscectomy. 3.  Chondroplasty of the patella, medial femoral condyle, medial tibial plateau.  ANESTHESIA:  General.  SURGEON:  Jene Every, MD  ASSISTANT:  Andrez Grime, PA  HISTORY:  This is a pleasant 69 year old with locking, giving way of the knee.  MRI indicating meniscus tear, complex medial chondromalacia, particularly in the medial compartment, indicated for knee arthroscopy, partial meniscectomy.  After addressing  mechanical symptoms, risks and benefits discussed including bleeding, infection, damage to neurovascular structures, no change in symptoms, worsening symptoms, DVT, PE, anesthetic complications, etc.  DESCRIPTION OF PROCEDURE:  With the patient in supine position, after induction of adequate general anesthesia, left lower extremity was prepped and draped in the usual sterile fashion.  A lateral parapatellar portal was fashioned with a #11 blade.   Ingress cannula atraumatically placed.  Irrigant was utilized to insufflate the joint.  Under direct visualization, a medial parapatellar portal was fashioned with a #11 blade after localization with an 18-gauge needle, sparing the medial meniscus.   Noted was extensive grade III change of the medial femoral condyle and there was a condyle flap lesion noted.  No grade IV change.  This measured about 3 x 2 cm.  I introduced a shaver and performed a light chondroplasty of the medial  femoral condyle.   Noted then was a complex tear of the posterior horn of the medial meniscus and posterior third.  This was somewhat tight posteriorly.  I introduced a small upbiting basket and resected the proximal one-third of the posterior third to a stable base,  further contoured with a shaver.  The remnant was stable to probe palpation.  The remainder of the meniscus was unremarkable.  ACL was unremarkable.  Lateral compartment revealed minor degenerative changes of the tibial plateau.  There was a radial tearing in the posterior third of the medial meniscus, introduced a shaver and shaved to a stable base.  The remainder was unremarkable.  Suprapatellar pouch revealed some grade III changes of the patella and the sulcus.  Light chondroplasty performed here.  There is normal patellofemoral tracking.  Gutters unremarkable.  Revisited all compartments.  No further pathology amenable to  arthroscopic intervention.  I therefore removed all instrumentation.  Portals were closed with 4-0 nylon simple sutures.  Marcaine 0.25% with epinephrine was infiltrated in joint.  Wound was dressed sterilely.  Awoken without difficulty and transported  to the recovery room in satisfactory condition.  The patient tolerated the procedure well.  No complications.  Assistant Andrez Grime, [place].  Minimal blood loss.  VN/NUANCE  D:10/29/2019 T:10/29/2019 JOB:[id number]

## 2019-10-29 NOTE — Anesthesia Preprocedure Evaluation (Signed)
Anesthesia Evaluation  Patient identified by MRN, date of birth, ID band Patient awake    Reviewed: Allergy & Precautions, NPO status , Patient's Chart, lab work & pertinent test results  Airway Mallampati: II  TM Distance: >3 FB Neck ROM: Full    Dental no notable dental hx.    Pulmonary neg pulmonary ROS,    Pulmonary exam normal breath sounds clear to auscultation       Cardiovascular hypertension, Normal cardiovascular exam Rhythm:Regular Rate:Normal     Neuro/Psych negative neurological ROS  negative psych ROS   GI/Hepatic negative GI ROS, Neg liver ROS,   Endo/Other  negative endocrine ROS  Renal/GU negative Renal ROS  negative genitourinary   Musculoskeletal negative musculoskeletal ROS (+)   Abdominal   Peds negative pediatric ROS (+)  Hematology negative hematology ROS (+)   Anesthesia Other Findings   Reproductive/Obstetrics negative OB ROS                             Anesthesia Physical Anesthesia Plan  ASA: II  Anesthesia Plan: General   Post-op Pain Management:    Induction: Intravenous  PONV Risk Score and Plan: 3 and Ondansetron, Dexamethasone and Treatment may vary due to age or medical condition  Airway Management Planned: LMA  Additional Equipment:   Intra-op Plan:   Post-operative Plan: Extubation in OR  Informed Consent: I have reviewed the patients History and Physical, chart, labs and discussed the procedure including the risks, benefits and alternatives for the proposed anesthesia with the patient or authorized representative who has indicated his/her understanding and acceptance.     Dental advisory given  Plan Discussed with: CRNA and Surgeon  Anesthesia Plan Comments:         Anesthesia Quick Evaluation  

## 2019-10-29 NOTE — Discharge Instructions (Signed)
ARTHROSCOPIC KNEE SURGERY HOME CARE INSTRUCTIONS   PAIN You will be expected to have a moderate amount of pain in the affected knee for approximately two weeks.  However, the first two to four days will be the most severe in terms of the pain you will experience.  Prescriptions have been provided for you to take as needed for the pain.  The pain can be markedly reduced by using the ice/compressive bandage given.  Exchange the ice packs whenever they thaw.  During the night, keep the bandage on because it will still provide some compression for the swelling.  Also, keep the leg elevated on pillows above your heart, and this will help alleviate the pain and swelling.  MEDICATION Prescriptions have been provided to take as needed for pain. To prevent blood clots, take Aspirin 325mg daily with a meal if not on a blood thinner and if no history of stomach ulcers.  ACTIVITY It is preferred that you stay on bedrest for approximately 24 hours.  However, you may go to the bathroom with help.  After this, you can start to be up and about progressively more.  Remember that the swelling may still increase after three to four days if you are up and doing too much.  You may put as much weight on the affected leg as pain will allow.  Use your crutches for comfort and safety.  However, as soon as you are able, you may discard the crutches and go without them.   DRESSING Keep the current dressing as dry as possible.  Two days after your surgery, you may remove the ice/compressive wrap, and surgical dressing.  You may now take a shower, but do not scrub the sounds directly with soap.  Let water rinse over these and gently wipe with your hand.  Reapply band-aids over the puncture wounds and more gauze if needed.  A slight amount of thin drainage can be normal at this time, and do not let it frighten you.  Reapply the ice/compressive wrap.  You may now repeat this every day each time you shower.  SYMPTOMS TO REPORT TO  YOUR DOCTOR  -Extreme pain.  -Extreme swelling.  -Temperature above 101 degrees that does not come down with acetaminophen     (Tylenol).  -Any changes in the feeling, color or movement of your toes.  -Extreme redness, heat, swelling or drainage at your incision  EXERCISE It is preferred that you begin to exercise on the day of your surgery.  Straight leg raises and short arc quads should be begun the afternoon or evening of surgery and continued until you come back for your follow-up appointment.   Attached is an instruction sheet on how to perform these two simple exercises.  Do these at least three times per day if not more.  You may bend your knee as much as is comfortable.  The puncture wounds may occasionally be slightly uncomfortable with bending of the knee.  Do not let this frighten you.  It is important to keep your knee motion, but do not overdo it.  If you have significant pain, simply do not bend the knee as far.   You will be given more exercises to perform at your first return visit.    RETURN APPOINTMENT Please make an appointment to be seen by your doctor in 10-14 days from your surgery.  Patient Signature:  ________________________________________________________  Nurse's Signature:  ________________________________________________________ 

## 2019-10-29 NOTE — Interval H&P Note (Signed)
History and Physical Interval Note:  10/29/2019 9:23 AM  Tiffany Odonnell  has presented today for surgery, with the diagnosis of Left knee medial and lateral meniscus tear.  The various methods of treatment have been discussed with the patient and family. After consideration of risks, benefits and other options for treatment, the patient has consented to  Procedure(s) with comments: KNEE ARTHROSCOPY WITH MEDIAL AND LATERAL MENISECTOMY WITH DEBRIDEMENT (Left) - 60 mins as a surgical intervention.  The patient's history has been reviewed, patient examined, no change in status, stable for surgery.  I have reviewed the patient's chart and labs.  Questions were answered to the patient's satisfaction.     Javier Docker

## 2019-10-29 NOTE — Anesthesia Procedure Notes (Signed)
Procedure Name: LMA Insertion Date/Time: 10/29/2019 10:14 AM Performed by: Theodosia Quay, CRNA Pre-anesthesia Checklist: Patient identified, Emergency Drugs available, Suction available and Patient being monitored Patient Re-evaluated:Patient Re-evaluated prior to induction Oxygen Delivery Method: Circle System Utilized Preoxygenation: Pre-oxygenation with 100% oxygen Induction Type: IV induction Ventilation: Mask ventilation without difficulty LMA: LMA inserted LMA Size: 4.0 Number of attempts: 1 Airway Equipment and Method: Bite block Placement Confirmation: positive ETCO2 Tube secured with: Tape Dental Injury: Teeth and Oropharynx as per pre-operative assessment

## 2019-10-29 NOTE — Anesthesia Postprocedure Evaluation (Signed)
Anesthesia Post Note  Patient: Tiffany Odonnell  Procedure(s) Performed: KNEE ARTHROSCOPY WITH MEDIAL AND LATERAL MENISECTOMY WITH DEBRIDEMENT (Left Knee)     Patient location during evaluation: PACU Anesthesia Type: General Level of consciousness: awake and alert Pain management: pain level controlled Vital Signs Assessment: post-procedure vital signs reviewed and stable Respiratory status: spontaneous breathing, nonlabored ventilation, respiratory function stable and patient connected to nasal cannula oxygen Cardiovascular status: blood pressure returned to baseline and stable Postop Assessment: no apparent nausea or vomiting Anesthetic complications: no    Last Vitals:  Vitals:   10/29/19 1057 10/29/19 1100  BP: 134/80 116/73  Pulse: 88 78  Resp: 12 12  Temp: 37 C   SpO2: 98% 95%    Last Pain:  Vitals:   10/29/19 1057  TempSrc:   PainSc: 0-No pain                 Stevee Valenta S

## 2019-10-29 NOTE — Brief Op Note (Signed)
10/29/2019  10:49 AM  PATIENT:  Tiffany Odonnell  69 y.o. female  PRE-OPERATIVE DIAGNOSIS:  Left knee medial and lateral meniscus tear  POST-OPERATIVE DIAGNOSIS:  Left knee medial and lateral meniscus tear  PROCEDURE:  Procedure(s) with comments: KNEE ARTHROSCOPY WITH MEDIAL AND LATERAL MENISECTOMY WITH DEBRIDEMENT (Left) - 60 mins  SURGEON:  Surgeon(s) and Role:    Jene Every, MD - Primary  PHYSICIAN ASSISTANT:   ASSISTANTS: Bissell   ANESTHESIA:   general  EBL:  min   BLOOD ADMINISTERED:none  DRAINS: none   LOCAL MEDICATIONS USED:  MARCAINE     SPECIMEN:  No Specimen  DISPOSITION OF SPECIMEN:  N/A  COUNTS:  YES  TOURNIQUET:  * No tourniquets in log *  DICTATION: .Other Dictation: Dictation Number [id number]  PLAN OF CARE: Discharge to home after PACU  PATIENT DISPOSITION:  PACU - hemodynamically stable.   Delay start of Pharmacological VTE agent (>24hrs) due to surgical blood loss or risk of bleeding: no

## 2020-05-13 ENCOUNTER — Encounter: Payer: Self-pay | Admitting: *Deleted

## 2020-05-14 ENCOUNTER — Encounter: Payer: Self-pay | Admitting: Neurology

## 2020-05-14 ENCOUNTER — Ambulatory Visit (INDEPENDENT_AMBULATORY_CARE_PROVIDER_SITE_OTHER): Payer: Medicare Other | Admitting: Neurology

## 2020-05-14 ENCOUNTER — Ambulatory Visit
Admission: RE | Admit: 2020-05-14 | Discharge: 2020-05-14 | Disposition: A | Discharge status: Home / Self Care | Payer: Medicare Other | Source: Ambulatory Visit | Attending: Neurology | Admitting: Neurology

## 2020-05-14 ENCOUNTER — Other Ambulatory Visit: Payer: Self-pay | Admitting: Neurology

## 2020-05-14 ENCOUNTER — Other Ambulatory Visit: Payer: Self-pay

## 2020-05-14 VITALS — BP 151/88 | HR 56 | Ht 61.0 in | Wt 174.0 lb

## 2020-05-14 DIAGNOSIS — M25551 Pain in right hip: Secondary | ICD-10-CM

## 2020-05-14 DIAGNOSIS — G5711 Meralgia paresthetica, right lower limb: Secondary | ICD-10-CM | POA: Diagnosis not present

## 2020-05-14 MED ORDER — DULOXETINE HCL 30 MG PO CPEP: 30.0000 mg | ORAL_CAPSULE | Freq: Every day | ORAL | 6 refills | Status: DC

## 2020-05-14 MED ORDER — MELOXICAM 7.5 MG PO TABS
7.5000 mg | ORAL_TABLET | Freq: Every day | ORAL | 11 refills | Status: DC | PRN
Start: 2020-05-14 — End: 2021-08-01

## 2020-05-14 NOTE — Patient Instructions (Signed)
[facility]    Address: [address], [place], [place] [address]  Phone: [phone]   

## 2020-05-14 NOTE — Progress Notes (Signed)
Chief Complaint  Patient presents with  . New Patient (Initial Visit)    She is here for evaluation for numbness of hands and feet with pain like pins and needles in random part of her body, but concentrated mainly in her R leg, and right side, lower back, head shakes at times. The feeling is excruciating.   Tiffany Odonnell. Referral    Sheran Luzamos, Richard, MD  . New room    here alone     HISTORICAL  Tiffany DailyLydia Odonnell is a 69 year old female, seen in request by her pain management doctor Sheran LuzRamos, Richard and her primary care physician [facility] Pec[facility]ese[facility], for evaluation of right lateral thigh area paresthesia, right hip pain, initial evaluation was on May 14, 2020.  I reviewed and summarized the referring note. HTN Right side kidney stone.  Patient reported when she was living at [facility] in 2007, she was diagnosed with neuropathy, presented with intermittent sharp transient pinprick sensation at different part of her body, at that time she also noticed small patchy area of right lateral thigh numbness.  Her symptoms gradually getting worse, right lateral thigh area numbness spreading to involving bigger area, become more persistent, sometimes even painful  In addition, since 2018, she also experienced intermittent right hip, low back pain, it is usually happened after prolonged standing, she will felt the pain, gradually building up, to the point that she could not tolerate it, soon after she sits down, the pain will improve, it has gradually getting worse over the past 3 years, it happens on a daily basis, to the point of limiting her daily activity, she has to take frequent breaks washing dishes, cooking,  She was seen by pain management Dr. Alger Simonsemos, reviewing the chart, MRI of lumbar spine showed multilevel degenerative disc disease, suggested outpatient physical therapy, considering lumbar epidural steroid injection  REVIEW OF SYSTEMS: Full 14 system review of systems performed and notable only for as  above All other review of systems were negative.  ALLERGIES: Allergies  Allergen Reactions  . Amoxicillin-Pot Clavulanate Nausea And Vomiting  . Ciprofloxacin Hives  . Latex Itching  . Macrolides And Ketolides Diarrhea and Nausea And Vomiting  . Sulfa Antibiotics Other (See Comments)    Trembling, back pain, red eyes  . Tetracyclines & Related Tinitus    Vertigo  . Ibuprofen Rash    HOME MEDICATIONS: Current Outpatient Medications  Medication Sig Dispense Refill  . Ascorbic Acid (VITAMIN C) 1000 MG tablet Take 2,000 mg by mouth daily as needed (immune support).    Tiffany Odonnell. aspirin EC 81 MG tablet Take 1 tablet (81 mg total) by mouth daily. (Patient taking differently: Take 81 mg by mouth. Occasionally) 14 tablet 1  . atenolol (TENORMIN) 100 MG tablet Take 100 mg by mouth daily.  3  . betamethasone dipropionate 0.05 % lotion Apply 1 application topically 2 (two) times daily as needed (rash). For scalp  2  . Carboxymethylcellul-Glycerin (LUBRICATING EYE DROPS OP) Place 1 drop into both eyes daily as needed (dry eyes).    . cyclobenzaprine (FLEXERIL) 10 MG tablet Take 10 mg by mouth as needed for muscle spasms.    Tiffany Odonnell. desonide (DESOWEN) 0.05 % cream Apply 1 application topically 2 (two) times daily as needed (rash).   2  . gabapentin (NEURONTIN) 300 MG capsule Take 300 mg by mouth as needed.    . indapamide (LOZOL) 2.5 MG tablet Take 2.5 mg by mouth daily.    . Potassium Citrate 15 MEQ (1620 MG) TBCR Take  15 mEq by mouth 2 (two) times daily.    Tiffany Odonnell ibuprofen (ADVIL) 200 MG tablet Take 400-600 mg by mouth every 6 (six) hours as needed for headache or moderate pain. (Patient not taking: Reported on 05/14/2020)     No current facility-administered medications for this visit.    PAST MEDICAL HISTORY: Past Medical History:  Diagnosis Date  . Anxiety   . Blood in urine   . Cervical disc disorder    bulging disc  . Dizziness   . Falls   . Fibromyalgia   . H/O Salmonella gastroenteritis   .  High cholesterol   . High cholesterol   . History of frequent urinary tract infections   . History of hiatal hernia   . History of vertigo   . Hypertension   . Imbalance   . Kidney stones   . Lichen planopilaris   . Lumbar herniated disc    L5-L6  . Numbness and tingling    hands and feet bilat   . Oscillopsia   . Peripheral neuropathy   . Septicemia (HCC)    2013  . Sinusitis   . Sjogren's disease (HCC)   . Tinnitus   . Wears glasses     PAST SURGICAL HISTORY: Past Surgical History:  Procedure Laterality Date  . ABDOMINAL HYSTERECTOMY     2014  . KNEE ARTHROSCOPY WITH MEDIAL MENISECTOMY Left 10/29/2019   Procedure: KNEE ARTHROSCOPY WITH MEDIAL AND LATERAL MENISECTOMY WITH DEBRIDEMENT;  Surgeon: Jene Every, MD;  Location: [facility];  Service: Orthopedics;  Laterality: Left;  60 mins  . NASAL SINUS SURGERY    . NEPHROLITHOTOMY Right 05/31/2015   Procedure: RIGHT PERCUTANEOUS NEPHROLITHOTOMY  ;  Surgeon: Ihor Gully, MD;  Location: [facility];  Service: Urology;  Laterality: Right;  . prolapsed bladder and rectocele surgery     . TUBAL LIGATION     1983  . URETERAL STENT PLACEMENT     times 2 secondary to kidney stone     FAMILY HISTORY: Family History  Problem Relation Age of Onset  . CAD Mother   . Diabetes Father   . Non-Hodgkin's lymphoma Brother   . Lupus Other   . Hypertension Other   . Diabetes Other   . Glaucoma Maternal Uncle   . Non-Hodgkin's lymphoma Brother   . Non-Hodgkin's lymphoma Brother        half brother    SOCIAL HISTORY: Social History   Socioeconomic History  . Marital status: Divorced    Spouse name: Not on file  . Number of children: 2  . Years of education: Not on file  . Highest education level: 12th grade  Occupational History  . Not on file  Tobacco Use  . Smoking status: Never Smoker  . Smokeless tobacco: Never Used  Vaping Use  . Vaping Use: Never used  Substance and Sexual Activity  . Alcohol use: No  . Drug use: No    . Sexual activity: Never  Other Topics Concern  . Not on file  Social History Narrative   Lives at home with daughter   Left handed   Caffeine: 1 cup coffee per day some mornings   Social Determinants of Health   Financial Resource Strain:   . Difficulty of Paying Living Expenses: Not on file  Food Insecurity:   . Worried About Programme researcher, broadcasting/film/video in the Last Year: Not on file  . Ran Out of Food in the Last Year: Not on file  Transportation Needs:   .  Lack of Transportation (Medical): Not on file  . Lack of Transportation (Non-Medical): Not on file  Physical Activity:   . Days of Exercise per Week: Not on file  . Minutes of Exercise per Session: Not on file  Stress:   . Feeling of Stress : Not on file  Social Connections:   . Frequency of Communication with Friends and Family: Not on file  . Frequency of Social Gatherings with Friends and Family: Not on file  . Attends Religious Services: Not on file  . Active Member of Clubs or Organizations: Not on file  . Attends Banker Meetings: Not on file  . Marital Status: Not on file  Intimate Partner Violence:   . Fear of Current or Ex-Partner: Not on file  . Emotionally Abused: Not on file  . Physically Abused: Not on file  . Sexually Abused: Not on file     PHYSICAL EXAM   Vitals:   05/14/20 0850  BP: (!) 151/88  Pulse: (!) 56  Weight: 174 lb (78.9 kg)  Height: 5\' 1"  (1.549 m)   Not recorded     Body mass index is 32.88 kg/m.  PHYSICAL EXAMNIATION:  Gen: NAD, conversant, well nourised, well groomed                     Cardiovascular: Regular rate rhythm, no peripheral edema, warm, nontender. Eyes: Conjunctivae clear without exudates or hemorrhage Neck: Supple, no carotid bruits. Pulmonary: Clear to auscultation bilaterally   NEUROLOGICAL EXAM:  MENTAL STATUS: Speech:    Speech is normal; fluent and spontaneous with normal comprehension.  Cognition:     Orientation to time, place and  person     Normal recent and remote memory     Normal Attention span and concentration     Normal Language, naming, repeating,spontaneous speech     Fund of knowledge   CRANIAL NERVES: CN II: Visual fields are full to confrontation. Pupils are round equal and briskly reactive to light. CN III, IV, VI: extraocular movement are normal. No ptosis. CN V: Facial sensation is intact to light touch CN VII: Face is symmetric with normal eye closure  CN VIII: Hearing is normal to causal conversation. CN IX, X: Phonation is normal. CN XI: Head turning and shoulder shrug are intact  MOTOR: There is no pronator drift of out-stretched arms. Muscle bulk and tone are normal. Muscle strength is normal.  Cross leg would cause right hip/right groin area deep achy pain  REFLEXES: Reflexes are 2+ and symmetric at the biceps, triceps, knees, and ankles. Plantar responses are flexor.  SENSORY: Intact to light touch, pinprick and vibratory sensation are intact in fingers and toes.  COORDINATION: There is no trunk or limb dysmetria noted.  GAIT/STANCE: Posture is normal. Gait is steady with normal steps, base, arm swing, and turning. Heel and toe walking are normal. Tandem gait is normal.  Romberg is absent.   DIAGNOSTIC DATA (LABS, IMAGING, TESTING) - I reviewed patient records, labs, notes, testing and imaging myself where available.   ASSESSMENT AND PLAN  Chicquita Mendel is a 69 y.o. 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 Ph.D.  [facility] [address], [address] [place], [place] [address] Ph: [phone] Fax:  [phone]  CC:  Sheran Luz, MD [address] [place],  [place] [address]  Leilani Able, MD

## 2020-05-17 NOTE — Progress Notes (Signed)
Please call patient, x-ray of right hip showed no significant abnormalities.

## 2020-05-17 NOTE — Progress Notes (Signed)
Please call patient, x-ray of right hip showed no significant abnormalities.

## 2020-06-19 ENCOUNTER — Other Ambulatory Visit: Payer: Self-pay | Admitting: Neurology

## 2021-03-14 ENCOUNTER — Emergency Department (HOSPITAL_COMMUNITY)
Admission: EM | Admit: 2021-03-14 | Discharge: 2021-03-15 | Disposition: A | Discharge status: Home / Self Care | Payer: Medicare Other | Attending: Emergency Medicine | Admitting: Emergency Medicine

## 2021-03-14 ENCOUNTER — Other Ambulatory Visit: Payer: Self-pay

## 2021-03-14 ENCOUNTER — Emergency Department (HOSPITAL_COMMUNITY): Payer: Medicare Other

## 2021-03-14 ENCOUNTER — Encounter (HOSPITAL_COMMUNITY): Payer: Self-pay | Admitting: Emergency Medicine

## 2021-03-14 DIAGNOSIS — Z79899 Other long term (current) drug therapy: Secondary | ICD-10-CM | POA: Insufficient documentation

## 2021-03-14 DIAGNOSIS — M5442 Lumbago with sciatica, left side: Secondary | ICD-10-CM | POA: Diagnosis not present

## 2021-03-14 DIAGNOSIS — Z20822 Contact with and (suspected) exposure to covid-19: Secondary | ICD-10-CM | POA: Insufficient documentation

## 2021-03-14 DIAGNOSIS — N3001 Acute cystitis with hematuria: Secondary | ICD-10-CM | POA: Diagnosis not present

## 2021-03-14 DIAGNOSIS — I10 Essential (primary) hypertension: Secondary | ICD-10-CM | POA: Insufficient documentation

## 2021-03-14 DIAGNOSIS — D72829 Elevated white blood cell count, unspecified: Secondary | ICD-10-CM | POA: Diagnosis not present

## 2021-03-14 DIAGNOSIS — Z7982 Long term (current) use of aspirin: Secondary | ICD-10-CM | POA: Diagnosis not present

## 2021-03-14 DIAGNOSIS — Z91040 Latex allergy status: Secondary | ICD-10-CM | POA: Diagnosis not present

## 2021-03-14 DIAGNOSIS — M5432 Sciatica, left side: Secondary | ICD-10-CM

## 2021-03-14 DIAGNOSIS — M545 Low back pain, unspecified: Secondary | ICD-10-CM | POA: Diagnosis present

## 2021-03-14 LAB — URINALYSIS, ROUTINE W REFLEX MICROSCOPIC
Bilirubin Urine: NEGATIVE
Glucose, UA: NEGATIVE mg/dL
Ketones, ur: NEGATIVE mg/dL
Nitrite: POSITIVE — AB
Protein, ur: NEGATIVE mg/dL
Specific Gravity, Urine: 1.01 (ref 1.005–1.030)
pH: 6.5 (ref 5.0–8.0)

## 2021-03-14 LAB — CBC WITH DIFFERENTIAL/PLATELET
Abs Immature Granulocytes: 0.06 10*3/uL (ref 0.00–0.07)
Basophils Absolute: 0.1 10*3/uL (ref 0.0–0.1)
Basophils Relative: 0 %
Eosinophils Absolute: 0 10*3/uL (ref 0.0–0.5)
Eosinophils Relative: 0 %
HCT: 44.4 % (ref 36.0–46.0)
Hemoglobin: 14.7 g/dL (ref 12.0–15.0)
Immature Granulocytes: 0 %
Lymphocytes Relative: 6 %
Lymphs Abs: 0.8 10*3/uL (ref 0.7–4.0)
MCH: 31.5 pg (ref 26.0–34.0)
MCHC: 33.1 g/dL (ref 30.0–36.0)
MCV: 95.1 fL (ref 80.0–100.0)
Monocytes Absolute: 1 10*3/uL (ref 0.1–1.0)
Monocytes Relative: 7 %
Neutro Abs: 11.7 10*3/uL — ABNORMAL HIGH (ref 1.7–7.7)
Neutrophils Relative %: 87 %
Platelets: 249 10*3/uL (ref 150–400)
RBC: 4.67 MIL/uL (ref 3.87–5.11)
RDW: 12.1 % (ref 11.5–15.5)
WBC: 13.6 10*3/uL — ABNORMAL HIGH (ref 4.0–10.5)
nRBC: 0 % (ref 0.0–0.2)

## 2021-03-14 LAB — COMPREHENSIVE METABOLIC PANEL
ALT: 29 U/L (ref 0–44)
AST: 26 U/L (ref 15–41)
Albumin: 4.2 g/dL (ref 3.5–5.0)
Alkaline Phosphatase: 71 U/L (ref 38–126)
Anion gap: 8 (ref 5–15)
BUN: 24 mg/dL — ABNORMAL HIGH (ref 8–23)
CO2: 27 mmol/L (ref 22–32)
Calcium: 9.6 mg/dL (ref 8.9–10.3)
Chloride: 97 mmol/L — ABNORMAL LOW (ref 98–111)
Creatinine, Ser: 0.82 mg/dL (ref 0.44–1.00)
GFR, Estimated: >60 mL/min (ref >60)
Glucose, Bld: 118 mg/dL — ABNORMAL HIGH (ref 70–99)
Potassium: 4.3 mmol/L (ref 3.5–5.1)
Sodium: 132 mmol/L — ABNORMAL LOW (ref 135–145)
Total Bilirubin: 1 mg/dL (ref 0.3–1.2)
Total Protein: 8.6 g/dL — ABNORMAL HIGH (ref 6.5–8.1)

## 2021-03-14 LAB — RESP PANEL BY RT-PCR (FLU A&B, COVID) ARPGX2
Influenza A by PCR: NEGATIVE
Influenza B by PCR: NEGATIVE
SARS Coronavirus 2 by RT PCR: NEGATIVE

## 2021-03-14 MED ORDER — OXYCODONE HCL 5 MG PO TABS
5.0000 mg | ORAL_TABLET | Freq: Once | ORAL | Status: AC
  Administered 2021-03-14: 5 mg via ORAL
  Filled 2021-03-14: qty 1

## 2021-03-14 MED ORDER — LIDOCAINE 5 % EX PTCH
1.0000 | MEDICATED_PATCH | CUTANEOUS | Status: DC
  Administered 2021-03-14: 1 via TRANSDERMAL
  Filled 2021-03-14: qty 1

## 2021-03-14 MED ORDER — ONDANSETRON 4 MG PO TBDP
4.0000 mg | ORAL_TABLET | Freq: Once | ORAL | Status: AC
  Administered 2021-03-14: 4 mg via ORAL
  Filled 2021-03-14: qty 1

## 2021-03-14 MED ORDER — DIAZEPAM 2 MG PO TABS
2.0000 mg | ORAL_TABLET | Freq: Once | ORAL | Status: AC
  Administered 2021-03-14: 2 mg via ORAL
  Filled 2021-03-14: qty 1

## 2021-03-14 MED ORDER — ACETAMINOPHEN 325 MG PO TABS
650.0000 mg | ORAL_TABLET | Freq: Once | ORAL | Status: AC
  Administered 2021-03-14: 650 mg via ORAL
  Filled 2021-03-14: qty 2

## 2021-03-14 NOTE — ED Provider Notes (Signed)
Emergency Medicine Provider Triage Evaluation Note  Tiffany Odonnell , a 70 y.o. female  was evaluated in triage.  Pt complains of generalized body aches x3 days.  Associated nasal congestion and cough.  States 2 great granddaughters are sick, but have tested negative for COVID.  Associated nausea, no vomiting.  Review of Systems  Positive: Myalgias, cough, nasal congestion, nausea Negative: cp  Physical Exam  BP 136/71 (BP Location: Left Arm)   Pulse 70   Temp 99.6 F (37.6 C) (Oral)   Resp 18   SpO2 96%  Gen:   Awake, no distress   Resp:  Normal effort  MSK:   Moves extremities without difficulty  Other:  No ttp of the abd  Medical Decision Making  Medically screening exam initiated at 2:39 PM.  Appropriate orders placed.  Tiffany Odonnell was informed that the remainder of the evaluation will be completed by another provider, this initial triage assessment does not replace that evaluation, and the importance of remaining in the ED until their evaluation is complete.  Labs, covid, Tiffany Odonnell, [place] 03/14/21 1440    Tiffany Files, MD 03/14/21 2127

## 2021-03-14 NOTE — ED Provider Notes (Signed)
[facility]-EMERGENCY DEPT Provider Note   CSN: [id number] Arrival date & time: 03/14/21  1405     History Chief Complaint  Patient presents with   Back Pain    Tiffany Odonnell is a 70 y.o. female.  70 y.o female with a PMH of Sjorgen syndrome presents to the ED via EMS with a chief complaint of back pain along with generalized weakness for the past 3 days.  Patient does report she is been taking care of her granddaughters, and doing more movement along with lifting around the house.  She reports her pain is sharp along the lumbar region, with radiation down her left leg.  States that this pain feels similar as her "previous sciatic pain ".  Pain is exacerbated with sitting down, exacerbated with internally rotation of her hip.  She has been taking Flexeril along with 800 mg ibuprofen but reports no improvement in her symptoms.  She does have a history of neuropathy but reports that this pain feels to be more sciatic in nature.  She also has some nasal congestion along with cough, feels like she might also be having a viral infection.  In addition she is reporting some discomfort with urination, however reports that this pain "does not feel like a urinary tract infection ".  She is also had some nausea however has not had any episodes of vomiting.  Does report some urinary incontinence, however attributes this to "weak pelvic floor ", no bowel incontinence, no fever, no trauma.  The history is provided by the patient and medical records.  Back Pain Location:  Lumbar spine Quality:  Stabbing and shooting Radiates to:  L thigh and L knee Pain severity:  Moderate Pain is:  Same all the time Onset quality:  Gradual Duration:  3 days Timing:  Constant Associated symptoms: no abdominal pain, no chest pain and no fever       Past Medical History:  Diagnosis Date   Anxiety    Blood in urine    Cervical disc disorder    bulging disc   Dizziness    Falls     Fibromyalgia    H/O Salmonella gastroenteritis    High cholesterol    High cholesterol    History of frequent urinary tract infections    History of hiatal hernia    History of vertigo    Hypertension    Imbalance    Kidney stones    Lichen planopilaris    Lumbar herniated disc    L5-L6   Numbness and tingling    hands and feet bilat    Oscillopsia    Peripheral neuropathy    Septicemia (HCC)    2013   Sinusitis    Sjogren's disease (HCC)    Tinnitus    Wears glasses     Patient Active Problem List   Diagnosis Date Noted   Right hip pain 05/14/2020   Meralgia paresthetica of right side 05/14/2020   Hospice care patient 10/29/2019   Staghorn calculus 05/31/2015   Nephrolithiasis     Past Surgical History:  Procedure Laterality Date   ABDOMINAL HYSTERECTOMY     2014   KNEE ARTHROSCOPY WITH MEDIAL MENISECTOMY Left 10/29/2019   Procedure: KNEE ARTHROSCOPY WITH MEDIAL AND LATERAL MENISECTOMY WITH DEBRIDEMENT;  Surgeon: Jene Every, MD;  Location: [facility];  Service: Orthopedics;  Laterality: Left;  60 mins   NASAL SINUS SURGERY     NEPHROLITHOTOMY Right 05/31/2015   Procedure: RIGHT PERCUTANEOUS NEPHROLITHOTOMY  ;  Surgeon: Ihor Gully, MD;  Location: [facility];  Service: Urology;  Laterality: Right;   prolapsed bladder and rectocele surgery      TUBAL LIGATION     1983   URETERAL STENT PLACEMENT     times 2 secondary to kidney stone      OB History   No obstetric history on file.     Family History  Problem Relation Age of Onset   CAD Mother    Diabetes Father    Non-Hodgkin's lymphoma Brother    Lupus Other    Hypertension Other    Diabetes Other    Glaucoma Maternal Uncle    Non-Hodgkin's lymphoma Brother    Non-Hodgkin's lymphoma Brother        half brother    Social History   Tobacco Use   Smoking status: Never   Smokeless tobacco: Never  Vaping Use   Vaping Use: Never used  Substance Use Topics   Alcohol use: No   Drug use: No    Home  Medications Prior to Admission medications   Medication Sig Start Date End Date Taking? Authorizing Provider  Ascorbic Acid (VITAMIN C) 1000 MG tablet Take 2,000 mg by mouth daily as needed (immune support).    [provider]  aspirin EC 81 MG tablet Take 1 tablet (81 mg total) by mouth daily. Patient taking differently: Take 81 mg by mouth. Occasionally 10/29/19   Jene Every, MD  atenolol (TENORMIN) 100 MG tablet Take 100 mg by mouth daily. 01/30/18   [provider]  betamethasone dipropionate 0.05 % lotion Apply 1 application topically 2 (two) times daily as needed (rash). For scalp 04/03/18   [provider]  Carboxymethylcellul-Glycerin (LUBRICATING EYE DROPS OP) Place 1 drop into both eyes daily as needed (dry eyes).    [provider]  cephALEXin (KEFLEX) 500 MG capsule Take 1 capsule (500 mg total) by mouth 2 (two) times daily for 7 days. 03/15/21 03/22/21 Yes Kaysia Willard, Leonie Douglas, PA-C  cyclobenzaprine (FLEXERIL) 10 MG tablet Take 1 tablet (10 mg total) by mouth 2 (two) times daily as needed for up to 7 days for muscle spasms. 03/15/21 03/22/21 Yes Atlee Villers, PA-C  desonide (DESOWEN) 0.05 % cream Apply 1 application topically 2 (two) times daily as needed (rash).  04/03/18   [provider]  DULoxetine (CYMBALTA) 30 MG capsule TAKE 1 CAPSULE BY MOUTH EVERY DAY 06/21/20   Levert Feinstein, MD  gabapentin (NEURONTIN) 300 MG capsule Take 300 mg by mouth as needed.    [provider]  ibuprofen (ADVIL) 200 MG tablet Take 400-600 mg by mouth every 6 (six) hours as needed for headache or moderate pain. Patient not taking: Reported on 05/14/2020    [provider]  indapamide (LOZOL) 2.5 MG tablet Take 2.5 mg by mouth daily.    [provider]  meloxicam (MOBIC) 7.5 MG tablet Take 1 tablet (7.5 mg total) by mouth daily as needed for pain. 05/14/20   Levert Feinstein, MD  Potassium Citrate 15 MEQ (1620 MG) TBCR Take 15 mEq by mouth 2 (two) times  daily. 10/01/19   [provider]    Allergies    Amoxicillin-pot clavulanate, Ciprofloxacin, Latex, Macrolides and ketolides, Sulfa antibiotics, Tetracyclines & related, and Ibuprofen  Review of Systems   Review of Systems  Constitutional:  Negative for chills and fever.  HENT:  Negative for sore throat.   Respiratory:  Negative for shortness of breath.   Cardiovascular:  Negative for chest pain.  Gastrointestinal:  Positive for nausea. Negative for abdominal pain and vomiting.  Genitourinary:  Negative for difficulty urinating.  Musculoskeletal:  Positive for back pain.  All other systems reviewed and are negative.  Physical Exam Updated Vital Signs BP 135/86   Pulse (!) 107   Temp 99.5 F (37.5 C) (Oral)   Resp 18   SpO2 94%   Physical Exam Vitals and nursing note reviewed.  Constitutional:      Appearance: She is ill-appearing.  HENT:     Head: Normocephalic and atraumatic.     Mouth/Throat:     Mouth: Mucous membranes are moist.  Eyes:     Pupils: Pupils are equal, round, and reactive to light.  Cardiovascular:     Rate and Rhythm: Normal rate.  Pulmonary:     Effort: Pulmonary effort is normal.     Breath sounds: No wheezing.  Abdominal:     General: Abdomen is flat.     Comments: No CVA tenderness BL.   Musculoskeletal:     Cervical back: Normal range of motion and neck supple.     Lumbar back: Tenderness present.       Back:     Comments: RLE- KF,KE 5/5 strength LLE- HF, HE 5/5 strength Normal gait. No pronator drift. No leg drop.  CN I, II and VIII not tested. CN II-XII grossly intact bilaterally.      Skin:    General: Skin is warm and dry.  Neurological:     Mental Status: She is alert and oriented to person, place, and time.    ED Results / Procedures / Treatments   Labs (all labs ordered are listed, but only abnormal results are displayed) Labs Reviewed  CBC WITH DIFFERENTIAL/PLATELET - Abnormal; Notable for the following  components:      Result Value   WBC 13.6 (*)    Neutro Abs 11.7 (*)    All other components within normal limits  COMPREHENSIVE METABOLIC PANEL - Abnormal; Notable for the following components:   Sodium 132 (*)    Chloride 97 (*)    Glucose, Bld 118 (*)    BUN 24 (*)    Total Protein 8.6 (*)    All other components within normal limits  URINALYSIS, ROUTINE W REFLEX MICROSCOPIC - Abnormal; Notable for the following components:   APPearance CLOUDY (*)    Hgb urine dipstick SMALL (*)    Nitrite POSITIVE (*)    Leukocytes,Ua LARGE (*)    All other components within normal limits  URINALYSIS, MICROSCOPIC (REFLEX) - Abnormal; Notable for the following components:   Bacteria, UA RARE (*)    All other components within normal limits  RESP PANEL BY RT-PCR (FLU A&B, COVID) [id number]  URINE CULTURE    EKG None  Radiology CT Renal Stone Study  Result Date: 03/14/2021 CLINICAL DATA:  Flank pain, kidney stone suspected EXAM: CT ABDOMEN AND PELVIS WITHOUT CONTRAST TECHNIQUE: Multidetector CT imaging of the abdomen and pelvis was performed following the standard protocol without IV contrast. COMPARISON:  CT abdomen pelvis 02/20/2018 FINDINGS: Lower chest: Bilateral lower lobe subsegmental atelectasis. Hepatobiliary: Grossly similar-appearing subcentimeter hypodensities too small to characterize. No focal liver abnormality. No gallstones, gallbladder wall thickening, or pericholecystic fluid. No biliary dilatation. Pancreas: Those No focal lesion. Normal pancreatic contour. No surrounding inflammatory changes. No main pancreatic ductal dilatation. Spleen: Normal in size without focal abnormality. Adrenals/Urinary Tract: No adrenal nodule bilaterally. There is a 1.6 x 2.5 cm cm calcified stone within the right kidney.  Another punctate stone is also noted within the superior pole of right kidney. Redemonstration of a persistent Dromedary hump of the left kidney. No hydronephrosis. No ureterolithiasis or  hydroureter. The urinary bladder is unremarkable. Stomach/Bowel: Stomach is within normal limits. No evidence of bowel wall thickening or dilatation. Scattered sigmoid diverticulosis. Appendix appears normal. Vascular/Lymphatic: No abdominal aorta or iliac aneurysm. Mild atherosclerotic plaque of the aorta and its branches. No abdominal, pelvic, or inguinal lymphadenopathy. Reproductive: Status post hysterectomy. No adnexal masses. Other: No intraperitoneal free fluid. No intraperitoneal free gas. No organized fluid collection. Musculoskeletal: No abdominal wall hernia or abnormality. No suspicious lytic or blastic osseous lesions. No acute displaced fracture. L5-S1 pseudoarthrosis. Grade 1 anterolisthesis of L3 on L4. Associated L3-L4 facet arthropathy. IMPRESSION: 1. Nonobstructive right nephrolithiasis measuring up to 2.5 cm. 2. Scattered colonic diverticulosis with no acute diverticulitis. 3.  Aortic Atherosclerosis (ICD10-I70.0). Electronically Signed   By: Tish FredericksonMorgane  Naveau M.D.   On: 03/14/2021 21:20    Procedures Procedures   Medications Ordered in ED Medications  lidocaine (LIDODERM) 5 % 1 patch (1 patch Transdermal Patch Applied 03/14/21 2330)  cefTRIAXone (ROCEPHIN) 1 g in sodium chloride 0.9 % 100 mL IVPB (has no administration in time range)  ondansetron (ZOFRAN) injection 4 mg (has no administration in time range)  ondansetron (ZOFRAN-ODT) disintegrating tablet 4 mg (4 mg Oral Given 03/14/21 2022)  acetaminophen (TYLENOL) tablet 650 mg (650 mg Oral Given 03/14/21 2021)  oxyCODONE (Oxy IR/ROXICODONE) immediate release tablet 5 mg (5 mg Oral Given 03/14/21 2330)  diazepam (VALIUM) tablet 2 mg (2 mg Oral Given 03/14/21 2330)    ED Course  I have reviewed the triage vital signs and the nursing notes.  Pertinent labs & imaging results that were available during my care of the patient were reviewed by me and considered in my medical decision making (see chart for details).  Clinical Course as  of 03/15/21 0013  Mon Mar 14, 2021  2114 WBC(!): 13.6 [JS]  2328 Nitrite(!): POSITIVE [JS]  2328 Leukocytes,Ua(!): LARGE [JS]  2328 Bacteria, UA(!): RARE [JS]  2328 WBC, UA: >50 [JS]  2329 Hgb urine dipstick(!): SMALL [JS]  2346 Creatinine: 0.82 [JS]    Clinical Course User Index [JS] Claude MangesSoto, Ossie Yebra, PA-C   MDM Rules/Calculators/A&P    Patient with extensive past medical history including fibromyalgia, recurrent lumbar spine pain, degenerative disc disease presents to the ED today with worsening pain of the left lumbar spine pain with radiation down her leg similar to her previous sciatic episodes.  Also has a history of peripheral neuropathy, she does take pain medication such as muscle relaxers for this but has not had any improvement.  She also endorses a prior history of kidney stones, however this report that this feels somewhat different.  During my evaluation patient is overall chronically ill-appearing, lungs are clear to auscultation, there is significant pain with palpation of the left lumbar spine.  No CVA tenderness noted bilaterally.  Patient does have full range of motion of bilateral extremities with good pulses.  Labs have been ordered in triage, CBC with a slight leukocytosis of 13.6, hemoglobin stable.  CMP with a slight hyponatremia, creatinine level is within normal limits.  LFTs are unremarkable.  UA with nitrites, large leukocytes, greater than 50 white blood cell count consistent with acute cystitis with hematuria.  Reviewed of her prior urine cultures, positive for Klebsiella and sensitive to cefazolin.  Will place patient on Keflex.  She has previously been treated with Keflex.  We  will also provide her with IV Rocephin prior to disposition.  CT Renal showed: 1. Nonobstructive right nephrolithiasis measuring up to 2.5 cm. 2. Scattered colonic diverticulosis with no acute diverticulitis. 3. Aortic Atherosclerosis (ICD10-I70.0).  These results were discussed with  patient, she does have follow-up for her kidney stone With Dr. Michelle Piper.  However, we discussed treatment of UTI on today's visit.  She has tolerated Keflex in the past.  PDMP was also reviewed.  On exam, there is no CVA tenderness bilaterally.  There is pain along the lumbar spine.  12:12 AM patient rechecked by me, with improvement in her symptoms, tolerating p.o. adequately without any vomiting.  Remains afebrile.  Patient is stable for discharge.  She will follow-up with urologist as needed.  She is advised to return to the ED if she experiences any fever, nausea, vomiting or worsening symptoms.   Portions of this note were generated with Scientist, clinical (histocompatibility and immunogenetics). Dictation errors may occur despite best attempts at proofreading.  Final Clinical Impression(s) / ED Diagnoses Final diagnoses:  Acute cystitis with hematuria  Sciatica of left side    Rx / DC Orders ED Discharge Orders          Ordered    cephALEXin (KEFLEX) 500 MG capsule  2 times daily        03/15/21 0007    cyclobenzaprine (FLEXERIL) 10 MG tablet  2 times daily PRN        03/15/21 0007             Claude Manges, PA-C 03/15/21 0013    Cheryll Cockayne, MD 03/19/21 2259

## 2021-03-14 NOTE — ED Triage Notes (Signed)
Patient BIBA from home c/o lower back pain, headache, n/v, and body aches. Denies exposure to COVID-19 or sick contacts. Reports taking flexeril before EMS arrived.   BP 140/70, SpO2 97% RA, P 77

## 2021-03-14 NOTE — Discharge Instructions (Addendum)
You were provided with antibiotics to help with your infection.  Please take 1 tablet twice a day for the next 7 days.  We were also given a muscle relaxers to help with pain.Please take 1 tablet to help with

## 2021-03-15 DIAGNOSIS — M5442 Lumbago with sciatica, left side: Secondary | ICD-10-CM | POA: Diagnosis not present

## 2021-03-15 LAB — URINALYSIS, MICROSCOPIC (REFLEX): WBC, UA: >50 WBC/hpf (ref 0–5)

## 2021-03-15 MED ORDER — SODIUM CHLORIDE 0.9 % IV SOLN
1.0000 g | Freq: Once | INTRAVENOUS | Status: AC
  Administered 2021-03-15: 1 g via INTRAVENOUS
  Filled 2021-03-15: qty 10

## 2021-03-15 MED ORDER — ONDANSETRON HCL 4 MG/2ML IJ SOLN
4.0000 mg | Freq: Once | INTRAMUSCULAR | Status: AC
  Administered 2021-03-15: 4 mg via INTRAVENOUS
  Filled 2021-03-15: qty 2

## 2021-03-15 MED ORDER — CEPHALEXIN 500 MG PO CAPS
500.0000 mg | ORAL_CAPSULE | Freq: Two times a day (BID) | ORAL | 0 refills | Status: AC
Start: 2021-03-15 — End: 2021-03-22

## 2021-03-15 MED ORDER — CYCLOBENZAPRINE HCL 10 MG PO TABS
10.0000 mg | ORAL_TABLET | Freq: Two times a day (BID) | ORAL | 0 refills | Status: AC | PRN
Start: 2021-03-15 — End: 2021-03-22

## 2021-03-17 LAB — URINE CULTURE: Culture: >=100000 — AB

## 2021-03-18 ENCOUNTER — Telehealth: Payer: Self-pay | Admitting: Emergency Medicine

## 2021-03-18 NOTE — Telephone Encounter (Signed)
Post ED Visit - Positive Culture Follow-up  Culture report reviewed by antimicrobial stewardship pharmacist: [facility] []  Nathan Batchelder, Pharm.D. []  , Pharm.D., BCPS AQ-ID []  , Pharm.D., BCPS []  Celedonio Miyamoto, Pharm.D., BCPS []  [place], Garvin Fila.D., BCPS, AAHIVP []  , Pharm.D., BCPS, AAHIVP []  Georgina Pillion, PharmD, BCPS []  , PharmD, BCPS []  [place], PharmD, BCPS []  [place], PharmD []  , PharmD, BCPS [x]  Estella Husk, Student PharmD  Pharmacy Team []  Lysle Pearl, PharmD []  , PharmD []  Phillips Climes, PharmD []  , Rph []  Agapito Games) , PharmD []  Verlan Friends, PharmD []  , PharmD []  Mervyn Gay, PharmD []  , PharmD []  Johnston Ebbs, PharmD []  [facility], PharmD []  , PharmD []  Len Childs, PharmD   Positive urine culture Treated with Cephalexin, organism sensitive to the same and no further patient follow-up is required at this time.  Yavier Snider 03/18/2021, 4:01 PM

## 2021-05-10 ENCOUNTER — Other Ambulatory Visit: Payer: Self-pay | Admitting: Urology

## 2021-05-11 ENCOUNTER — Other Ambulatory Visit (HOSPITAL_COMMUNITY): Payer: Self-pay | Admitting: Urology

## 2021-05-11 DIAGNOSIS — N200 Calculus of kidney: Secondary | ICD-10-CM

## 2021-06-17 NOTE — Progress Notes (Addendum)
COVID swab appointment: 07/13/21  COVID Vaccine Completed: yes x2 Date COVID Vaccine completed: 11/05/19, 12/26/19 Has received booster: COVID vaccine manufacturer: Pf[employer]      Date of COVID positive in last 90 days: no  PCP - Newton Pigg, FNP Cardiologist - n/a  Chest x-ray - 03/25/21 on chart EKG - 03/25/21 on chart Stress Test - n/a ECHO - years ago per pt Cardiac Cath - n/a Pacemaker/ICD device last checked: n/a Spinal Cord Stimulator:n/a  Sleep Study - n/a CPAP -   Fasting Blood Sugar - 90-150 Checks Blood Sugar _1_ times a day  Blood Thinner Instructions: n/a Aspirin Instructions: Last Dose:  Activity level: Can go up a flight of stairs and perform activities of daily living without stopping and without symptoms of chest pain or shortness of breath.       Anesthesia review:   Patient denies shortness of breath, fever, cough and chest pain at PAT appointment   Patient verbalized understanding of instructions that were given to them at the PAT appointment. Patient was also instructed that they will need to review over the PAT instructions again at home before surgery.

## 2021-06-17 NOTE — Patient Instructions (Addendum)
DUE TO COVID-19 ONLY ONE VISITOR IS ALLOWED TO COME WITH YOU AND STAY IN THE WAITING ROOM ONLY DURING PRE OP AND PROCEDURE.   **NO VISITORS ARE ALLOWED IN THE SHORT STAY AREA OR RECOVERY ROOM!!**  IF YOU WILL BE ADMITTED INTO THE HOSPITAL YOU ARE ALLOWED ONLY TWO SUPPORT PEOPLE DURING VISITATION HOURS ONLY (7AM -8PM)   The support person(s) may change daily. The support person(s) must pass our screening, gel in and out, and wear a mask at all times, including in the patients room. Patients must also wear a mask when staff or their support person are in the room.  No visitors under the age of 37. Any visitor under the age of 28 must be accompanied by an adult.    COVID SWAB TESTING MUST BE COMPLETED ON:  07/13/21 @ 12:00pm    Come to [facility]. Sit in the lobby area to the right as you come in the main entrance. Dial [phone], give your name and let them know you are here for your COVID test. You are not required to quarantine, however you are required to wear a well-fitted mask when you are out and around people not in your household.  Hand Hygiene often Do NOT share personal items Notify your provider if you are in close contact with someone who has COVID or you develop fever 100.4 or greater, new onset of sneezing, cough, sore throat, shortness of breath or body aches.       Your procedure is scheduled on: 07/15/21   Report to [facility] Main Entrance    Report to admitting at 7:30 AM   Call this number if you have problems the morning of surgery [phone]   Follow liquid diet the day before surgery.   May have sip of water with medication day of surgery.  CLEAR LIQUID DIET  Foods Allowed                                                                     Foods Excluded  Water, Black Coffee and tea (no milk or creamer)           liquids that you cannot  Plain Jell-O in any flavor  (No red)                                    see through  such as: Fruit ices (not with fruit pulp)                                            milk, soups, orange juice              Iced Popsicles (No red)                                                All solid food  Apple juices Sports drinks like Gatorade (No red) Lightly seasoned clear broth or consume(fat free) Sugar  Sample Menu Breakfast                                Lunch                                     Supper Cranberry juice                    Beef broth                            Chicken broth Jell-O                                     Grape juice                           Apple juice Coffee or tea                        Jell-O                                      Popsicle                                                Coffee or tea                        Coffee or tea     Oral Hygiene is also important to reduce your risk of infection.                                    Remember - BRUSH YOUR TEETH THE MORNING OF SURGERY WITH YOUR REGULAR TOOTHPASTE   Take these medicines the morning of surgery with A SIP OF WATER: Atenolol.   How to Manage Your Diabetes Before and After Surgery  Why is it important to control my blood sugar before and after surgery? Improving blood sugar levels before and after surgery helps healing and can limit problems. A way of improving blood sugar control is eating a healthy diet by:  Eating less sugar and carbohydrates  Increasing activity/exercise  Talking with your doctor about reaching your blood sugar goals High blood sugars (greater than 180 mg/dL) can raise your risk of infections and slow your recovery, so you will need to focus on controlling your diabetes during the weeks before surgery. Make sure that the doctor who takes care of your diabetes knows about your planned surgery including the date and location.  How do I manage my blood sugar before surgery? Check your blood sugar at least 4 times a day,  starting 2 days before surgery, to make sure that the level is not too high or low. Check your blood sugar the morning of your surgery when  you wake up and every 2 hours until you get to the Short Stay unit. If your blood sugar is less than 70 mg/dL, you will need to treat for low blood sugar: Do not take insulin. Treat a low blood sugar (less than 70 mg/dL) with  cup of clear juice (cranberry or apple), 4 glucose tablets, OR glucose gel. Recheck blood sugar in 15 minutes after treatment (to make sure it is greater than 70 mg/dL). If your blood sugar is not greater than 70 mg/dL on recheck, call [phone] for further instructions. Report your blood sugar to the short stay nurse when you get to Short Stay.  If you are admitted to the hospital after surgery: Your blood sugar will be checked by the staff and you will probably be given insulin after surgery (instead of oral diabetes medicines) to make sure you have good blood sugar levels. The goal for blood sugar control after surgery is 80-180 mg/dL.  Reviewed and Endorsed by [facility] Patient Education Committee, August 2015                               You may not have any metal on your body including hair pins, jewelry, and body piercing             Do not wear make-up, lotions, powders, perfumes, or deodorant  Do not wear nail polish including gel and S&S, artificial/acrylic nails, or any other type of covering on natural nails including finger and toenails. If you have artificial nails, gel coating, etc. that needs to be removed by a nail salon please have this removed prior to surgery or surgery may need to be canceled/ delayed if the surgeon/ anesthesia feels like they are unable to be safely monitored.   Do not shave  48 hours prior to surgery.    Do not bring valuables to the hospital. [place] IS NOT             RESPONSIBLE   FOR VALUABLES.   Bring small overnight bag day of surgery.              Please read over the  following fact sheets you were given: IF YOU HAVE QUESTIONS ABOUT YOUR PRE-OP INSTRUCTIONS PLEASE CALL [phone]- [facility] - Preparing for Surgery Before surgery, you can play an important role.  Because skin is not sterile, your skin needs to be as free of germs as possible.  You can reduce the number of germs on your skin by washing with CHG (chlorahexidine gluconate) soap before surgery.  CHG is an antiseptic cleaner which kills germs and bonds with the skin to continue killing germs even after washing. Please DO NOT use if you have an allergy to CHG or antibacterial soaps.  If your skin becomes reddened/irritated stop using the CHG and inform your nurse when you arrive at Short Stay. Do not shave (including legs and underarms) for at least 48 hours prior to the first CHG shower.  You may shave your face/neck.  Please follow these instructions carefully:  1.  Shower with CHG Soap the night before surgery and the  morning of surgery.  2.  If you choose to wash your hair, wash your hair first as usual with your normal  shampoo.  3.  After you shampoo, rinse your hair and body thoroughly to remove the shampoo.  4.  Use CHG as you would any other liquid soap.  You can apply chg directly to the skin and wash.  Gently with a scrungie or clean washcloth.  5.  Apply the CHG Soap to your body ONLY FROM THE NECK DOWN.   Do   not use on face/ open                           Wound or open sores. Avoid contact with eyes, ears mouth and   genitals (private parts).                       Wash face,  Genitals (private parts) with your normal soap.             6.  Wash thoroughly, paying special attention to the area where your    surgery  will be performed.  7.  Thoroughly rinse your body with warm water from the neck down.  8.  DO NOT shower/wash with your normal soap after using and rinsing off the CHG Soap.                9.  Pat yourself dry with a clean towel.             10.  Wear clean pajamas.            11.  Place clean sheets on your bed the night of your first shower and do not  sleep with pets. Day of Surgery : Do not apply any lotions/deodorants the morning of surgery.  Please wear clean clothes to the hospital/surgery center.  FAILURE TO FOLLOW THESE INSTRUCTIONS MAY RESULT IN THE CANCELLATION OF YOUR SURGERY  PATIENT SIGNATURE_________________________________  NURSE SIGNATURE__________________________________  ________________________________________________________________________

## 2021-06-22 ENCOUNTER — Encounter (HOSPITAL_COMMUNITY)
Admission: RE | Admit: 2021-06-22 | Discharge: 2021-06-22 | Disposition: A | Discharge status: Home / Self Care | Payer: Medicare Other | Source: Ambulatory Visit | Attending: Urology | Admitting: Urology

## 2021-06-22 ENCOUNTER — Other Ambulatory Visit: Payer: Self-pay

## 2021-06-22 ENCOUNTER — Encounter (HOSPITAL_COMMUNITY): Payer: Self-pay

## 2021-06-22 VITALS — BP 137/87 | HR 56 | Temp 98.1°F | Resp 16 | Ht 61.0 in | Wt 151.4 lb

## 2021-06-22 DIAGNOSIS — E119 Type 2 diabetes mellitus without complications: Secondary | ICD-10-CM | POA: Diagnosis not present

## 2021-06-22 DIAGNOSIS — I2510 Atherosclerotic heart disease of native coronary artery without angina pectoris: Secondary | ICD-10-CM | POA: Diagnosis not present

## 2021-06-22 DIAGNOSIS — Z01818 Encounter for other preprocedural examination: Secondary | ICD-10-CM | POA: Diagnosis not present

## 2021-06-22 HISTORY — DX: Type 2 diabetes mellitus without complications: E11.9

## 2021-06-22 LAB — BASIC METABOLIC PANEL
Anion gap: 11 (ref 5–15)
BUN: 23 mg/dL (ref 8–23)
CO2: 27 mmol/L (ref 22–32)
Calcium: 9.7 mg/dL (ref 8.9–10.3)
Chloride: 99 mmol/L (ref 98–111)
Creatinine, Ser: 0.98 mg/dL (ref 0.44–1.00)
GFR, Estimated: >60 mL/min (ref >60)
Glucose, Bld: 130 mg/dL — ABNORMAL HIGH (ref 70–99)
Potassium: 4 mmol/L (ref 3.5–5.1)
Sodium: 137 mmol/L (ref 135–145)

## 2021-06-22 LAB — CBC
HCT: 44.6 % (ref 36.0–46.0)
Hemoglobin: 14.9 g/dL (ref 12.0–15.0)
MCH: 31.7 pg (ref 26.0–34.0)
MCHC: 33.4 g/dL (ref 30.0–36.0)
MCV: 94.9 fL (ref 80.0–100.0)
Platelets: 239 10*3/uL (ref 150–400)
RBC: 4.7 MIL/uL (ref 3.87–5.11)
RDW: 12.2 % (ref 11.5–15.5)
WBC: 6.8 10*3/uL (ref 4.0–10.5)
nRBC: 0 % (ref 0.0–0.2)

## 2021-06-22 LAB — HEMOGLOBIN A1C
Hgb A1c MFr Bld: 6 % — ABNORMAL HIGH (ref 4.8–5.6)
Mean Plasma Glucose: 125.5 mg/dL

## 2021-06-22 LAB — GLUCOSE, CAPILLARY: Glucose-Capillary: 143 mg/dL — ABNORMAL HIGH (ref 70–99)

## 2021-07-13 ENCOUNTER — Encounter (HOSPITAL_COMMUNITY)
Admission: RE | Admit: 2021-07-13 | Discharge: 2021-07-13 | Disposition: A | Discharge status: Home / Self Care | Payer: Medicare Other | Source: Ambulatory Visit | Attending: Urology | Admitting: Urology

## 2021-07-13 DIAGNOSIS — Z01812 Encounter for preprocedural laboratory examination: Secondary | ICD-10-CM | POA: Insufficient documentation

## 2021-07-13 DIAGNOSIS — Z20822 Contact with and (suspected) exposure to covid-19: Secondary | ICD-10-CM | POA: Insufficient documentation

## 2021-07-13 DIAGNOSIS — Z01818 Encounter for other preprocedural examination: Secondary | ICD-10-CM

## 2021-07-13 LAB — SARS CORONAVIRUS 2 (TAT 6-24 HRS): SARS Coronavirus 2: NEGATIVE

## 2021-07-14 ENCOUNTER — Other Ambulatory Visit (HOSPITAL_COMMUNITY): Payer: Self-pay | Admitting: Physician Assistant

## 2021-07-14 NOTE — H&P (Signed)
Chief Complaint: Patient was seen in consultation today for right percutaneous nephrostomy   Referring Physician(s): Gay,Matthew R  Supervising Physician: Arne Cleveland  Patient Status: [facility] - Out-pt  History of Present Illness: Tiffany Odonnell is a 71 y.o. female with a medical history significant for anxiety, DM, fibromyalgia, HTN, Sjogren's disease and recurrent right renal calculi. She is familiar to IR from right PCN placement 05/31/2015 for nephrolithotomy procedure with Urology.  She presented to the ED 03/14/21 with complaints of lower back pain and was found to have a 2.5 cm nonobstructive right nephrolithiasis. Urology is taking her to the OR today for definitive stone treatment.   Interventional Radiology has been asked to evaluate this patient for an image-guided right percutaneous nephrostomy tube to facilitate today's urological procedure.   Past Medical History:  Diagnosis Date   Anxiety    Blood in urine    Cervical disc disorder    bulging disc   Diabetes mellitus without complication (HCC)    Dizziness    Falls    Fibromyalgia    H/O Salmonella gastroenteritis    High cholesterol    High cholesterol    History of frequent urinary tract infections    History of hiatal hernia    History of vertigo    Hypertension    Imbalance    Kidney stones    Lichen planopilaris    Lumbar herniated disc    L5-L6   Numbness and tingling    hands and feet bilat    Oscillopsia    Peripheral neuropathy    Septicemia ([place])    2013   Sinusitis    Sjogren's disease ([place])    Tinnitus    Wears glasses     Past Surgical History:  Procedure Laterality Date   ABDOMINAL HYSTERECTOMY     2014   KNEE ARTHROSCOPY WITH MEDIAL MENISECTOMY Left 10/29/2019   Procedure: KNEE ARTHROSCOPY WITH MEDIAL AND LATERAL MENISECTOMY WITH DEBRIDEMENT;  Surgeon: Susa Day, MD;  Location: [facility] ORS;  Service: Orthopedics;  Laterality: Left;  60 mins   NASAL SINUS SURGERY      NEPHROLITHOTOMY Right 05/31/2015   Procedure: RIGHT PERCUTANEOUS NEPHROLITHOTOMY  ;  Surgeon: Kathie Rhodes, MD;  Location: [facility] ORS;  Service: Urology;  Laterality: Right;   prolapsed bladder and rectocele surgery      TUBAL LIGATION     1983   URETERAL STENT PLACEMENT     times 2 secondary to kidney stone     Allergies: Amoxicillin-pot clavulanate, Ciprofloxacin, Latex, Macrolides and ketolides, Sulfa antibiotics, Tetracyclines & related, and Ibuprofen  Medications: Prior to Admission medications   Medication Sig Start Date End Date Taking? Authorizing Provider  acidophilus (RISAQUAD) CAPS capsule Take 1 capsule by mouth daily.    [provider]  Ascorbic Acid (VITAMIN C) 1000 MG tablet Take 2,000 mg by mouth daily as needed (immune support).    [provider]  aspirin EC 81 MG tablet Take 1 tablet (81 mg total) by mouth daily. Patient not taking: Reported on 06/17/2021 10/29/19   Susa Day, MD  atenolol (TENORMIN) 100 MG tablet Take 100 mg by mouth daily. 01/30/18   [provider]  betamethasone dipropionate 0.05 % lotion Apply 1 application topically 2 (two) times daily as needed (rash). For scalp 04/03/18   [provider]  Carboxymethylcellul-Glycerin (LUBRICATING EYE DROPS OP) Place 1 drop into both eyes daily as needed (dry eyes).    [provider]  Cholecalciferol (VITAMIN D3 PO) Take 1  tablet by mouth 2 (two) times a week.    [provider]  cyclobenzaprine (FLEXERIL) 10 MG tablet Take 10 mg by mouth 3 (three) times daily as needed for muscle spasms.    [provider]  desonide (DESOWEN) 0.05 % cream Apply 1 application topically 2 (two) times daily as needed (rash).  04/03/18   [provider]  DULoxetine (CYMBALTA) 30 MG capsule TAKE 1 CAPSULE BY MOUTH EVERY DAY Patient not taking: Reported on 06/17/2021 06/21/20   Marcial Pacas, MD  hydrocortisone (ANUSOL-HC) 2.5 % rectal cream Place 1 application  rectally 3 (three) times daily as needed for hemorrhoids. 06/13/21   [provider]  indapamide (LOZOL) 2.5 MG tablet Take 2.5 mg by mouth daily.    [provider]  MAGNESIUM PO Take 1 tablet by mouth 2 (two) times a week.    [provider]  meloxicam (MOBIC) 7.5 MG tablet Take 1 tablet (7.5 mg total) by mouth daily as needed for pain. Patient not taking: Reported on 06/17/2021 05/14/20   Marcial Pacas, MD  Potassium Citrate 15 MEQ (1620 MG) TBCR Take 15 mEq by mouth 2 (two) times daily. 10/01/19   [provider]  zinc gluconate 50 MG tablet Take 50 mg by mouth 2 (two) times a week.    [provider]     Family History  Problem Relation Age of Onset   CAD Mother    Diabetes Father    Non-Hodgkin's lymphoma Brother    Lupus Other    Hypertension Other    Diabetes Other    Glaucoma Maternal Uncle    Non-Hodgkin's lymphoma Brother    Non-Hodgkin's lymphoma Brother        half brother    Social History   Socioeconomic History   Marital status: Divorced    Spouse name: Not on file   Number of children: 2   Years of education: Not on file   Highest education level: 12th grade  Occupational History   Not on file  Tobacco Use   Smoking status: Never   Smokeless tobacco: Never  Vaping Use   Vaping Use: Never used  Substance and Sexual Activity   Alcohol use: No   Drug use: No   Sexual activity: Never  Other Topics Concern   Not on file  Social History Narrative   Lives at home with daughter   Left handed   Caffeine: 1 cup coffee per day some mornings   Social Determinants of Health   Financial Resource Strain: Not on file  Food Insecurity: Not on file  Transportation Needs: Not on file  Physical Activity: Not on file  Stress: Not on file  Social Connections: Not on file    Review of Systems: A 12 point ROS discussed and pertinent positives are indicated in the HPI above.  All other systems are negative.  Review of  Systems  Constitutional:  Negative for appetite change and fatigue.  Respiratory:  Negative for cough and shortness of breath.   Cardiovascular:  Negative for chest pain and leg swelling.  Gastrointestinal:  Negative for diarrhea, nausea and vomiting.  Musculoskeletal:  Positive for back pain.  Neurological:  Negative for dizziness and headaches.   Vital Signs: BP (!) 148/84    Pulse 60    Temp 98.3 F (36.8 C) (Oral)    Resp 16    Ht 5\' 1"  (1.549 m)    Wt 155 lb (70.3 kg)    SpO2 98%  BMI 29.29 kg/m   Physical Exam Constitutional:      General: She is not in acute distress.    Appearance: She is not ill-appearing.  HENT:     Mouth/Throat:     Mouth: Mucous membranes are moist.     Pharynx: Oropharynx is clear.  Cardiovascular:     Rate and Rhythm: Normal rate and regular rhythm.     Pulses: Normal pulses.  Pulmonary:     Effort: Pulmonary effort is normal.     Breath sounds: Normal breath sounds.  Abdominal:     General: Bowel sounds are normal.     Palpations: Abdomen is soft.  Skin:    General: Skin is warm and dry.  Neurological:     Mental Status: She is alert and oriented to person, place, and time.    Imaging: No results found.  Labs:  CBC: Recent Labs    03/14/21 1648 06/22/21 0919 07/15/21 0735  WBC 13.6* 6.8 6.7  HGB 14.7 14.9 13.8  HCT 44.4 44.6 40.5  PLT 249 239 231    COAGS: Recent Labs    07/15/21 0735  INR 0.9    BMP: Recent Labs    03/14/21 1648 06/22/21 0919  NA 132* 137  K 4.3 4.0  CL 97* 99  CO2 27 27  GLUCOSE 118* 130*  BUN 24* 23  CALCIUM 9.6 9.7  CREATININE 0.82 0.98  GFRNONAA >60 >60    LIVER FUNCTION TESTS: Recent Labs    03/14/21 1648  BILITOT 1.0  AST 26  ALT 29  ALKPHOS 71  PROT 8.6*  ALBUMIN 4.2    TUMOR MARKERS:  No results for input(s): AFPTM, CEA, CA199, CHROMGRNA in the last 8760 hours.  Assessment and Plan:  Right renal calculi; nephrolithotomy procedure with Urology: Tiffany Odonnell,  71 year old female, presents today to the [place] [facility] department for an image-guided right percutaneous nephrostomy tube. She is scheduled for the OR today at 11:30 with Dr. Abner Greenspan.   Risks and benefits of right PCN placement was discussed with the patient including, but not limited to, infection, bleeding, significant bleeding causing loss or decrease in renal function or damage to adjacent structures.   All of the patient's questions were answered, patient is agreeable to proceed. Labs and vitals have been reviewed.   Consent signed and in chart.   Thank you for this interesting consult.  I greatly enjoyed meeting Tiffany Odonnell and look forward to participating in their care.  A copy of this report was sent to the requesting provider on this date.  Electronically Signed: Soyla Dryer, AGACNP-BC [phone] 07/15/2021, 8:11 AM   I spent a total of  30 Minutes   in face to face in clinical consultation, greater than 50% of which was counseling/coordinating care for percutaneous nephrostomy

## 2021-07-14 NOTE — Anesthesia Preprocedure Evaluation (Addendum)
Anesthesia Evaluation  Patient identified by MRN, date of birth, ID band Patient awake    Reviewed: Allergy & Precautions, NPO status , Patient's Chart, lab work & pertinent test results, reviewed documented beta blocker date and time   History of Anesthesia Complications Negative for: history of anesthetic complications  Airway Mallampati: II  TM Distance: >3 FB Neck ROM: Full    Dental  (+) Dental Advisory Given, Edentulous Upper, Partial Lower   Pulmonary neg pulmonary ROS,    Pulmonary exam normal        Cardiovascular hypertension, Pt. on medications and Pt. on home beta blockers Normal cardiovascular exam     Neuro/Psych PSYCHIATRIC DISORDERS Anxiety negative neurological ROS     GI/Hepatic negative GI ROS, Neg liver ROS,   Endo/Other  diabetes  Renal/GU   negative genitourinary   Musculoskeletal negative musculoskeletal ROS (+)   Abdominal   Peds negative pediatric ROS (+)  Hematology negative hematology ROS (+)   Anesthesia Other Findings   Reproductive/Obstetrics negative OB ROS                            Anesthesia Physical  Anesthesia Plan  ASA: 2  Anesthesia Plan: General   Post-op Pain Management:    Induction: Intravenous  PONV Risk Score and Plan: 3 and Ondansetron, Dexamethasone and Treatment may vary due to age or medical condition  Airway Management Planned: LMA  Additional Equipment:   Intra-op Plan:   Post-operative Plan: Extubation in OR  Informed Consent: I have reviewed the patients History and Physical, chart, labs and discussed the procedure including the risks, benefits and alternatives for the proposed anesthesia with the patient or authorized representative who has indicated his/her understanding and acceptance.     Dental advisory given  Plan Discussed with: Anesthesiologist  Anesthesia Plan Comments:        Anesthesia Quick  Evaluation

## 2021-07-15 ENCOUNTER — Observation Stay (HOSPITAL_COMMUNITY): Payer: Medicare Other

## 2021-07-15 ENCOUNTER — Encounter (HOSPITAL_COMMUNITY): Payer: Self-pay

## 2021-07-15 ENCOUNTER — Ambulatory Visit (HOSPITAL_COMMUNITY)
Admission: RE | Admit: 2021-07-15 | Discharge: 2021-07-15 | Disposition: A | Discharge status: Home / Self Care | Payer: Medicare Other | Source: Ambulatory Visit | Attending: Urology | Admitting: Urology

## 2021-07-15 ENCOUNTER — Observation Stay (HOSPITAL_COMMUNITY)
Admission: RE | Admit: 2021-07-15 | Discharge: 2021-07-18 | Disposition: A | Discharge status: Home / Self Care | Payer: Medicare Other | Source: Ambulatory Visit | Attending: Urology | Admitting: Urology

## 2021-07-15 ENCOUNTER — Ambulatory Visit (HOSPITAL_COMMUNITY): Payer: Medicare Other | Admitting: Anesthesiology

## 2021-07-15 ENCOUNTER — Encounter (HOSPITAL_COMMUNITY): Payer: Self-pay | Admitting: Urology

## 2021-07-15 ENCOUNTER — Ambulatory Visit (HOSPITAL_COMMUNITY): Payer: Medicare Other

## 2021-07-15 ENCOUNTER — Other Ambulatory Visit: Payer: Self-pay

## 2021-07-15 ENCOUNTER — Encounter (HOSPITAL_COMMUNITY)
Admission: RE | Disposition: A | Discharge status: Home / Self Care | Payer: Self-pay | Source: Ambulatory Visit | Attending: Urology

## 2021-07-15 ENCOUNTER — Ambulatory Visit (HOSPITAL_COMMUNITY)
Admission: RE | Admit: 2021-07-15 | Discharge: 2021-07-15 | Disposition: A | Discharge status: Home / Self Care | Payer: Medicare Other | Source: Ambulatory Visit | Attending: Pediatric Cardiology | Admitting: Pediatric Cardiology

## 2021-07-15 DIAGNOSIS — Z91040 Latex allergy status: Secondary | ICD-10-CM | POA: Insufficient documentation

## 2021-07-15 DIAGNOSIS — M3500 Sicca syndrome, unspecified: Secondary | ICD-10-CM | POA: Insufficient documentation

## 2021-07-15 DIAGNOSIS — Z87442 Personal history of urinary calculi: Secondary | ICD-10-CM | POA: Insufficient documentation

## 2021-07-15 DIAGNOSIS — N200 Calculus of kidney: Secondary | ICD-10-CM | POA: Insufficient documentation

## 2021-07-15 DIAGNOSIS — I10 Essential (primary) hypertension: Secondary | ICD-10-CM | POA: Insufficient documentation

## 2021-07-15 DIAGNOSIS — Z79899 Other long term (current) drug therapy: Secondary | ICD-10-CM | POA: Diagnosis not present

## 2021-07-15 DIAGNOSIS — N393 Stress incontinence (female) (male): Secondary | ICD-10-CM | POA: Insufficient documentation

## 2021-07-15 DIAGNOSIS — J939 Pneumothorax, unspecified: Secondary | ICD-10-CM

## 2021-07-15 DIAGNOSIS — E119 Type 2 diabetes mellitus without complications: Secondary | ICD-10-CM | POA: Insufficient documentation

## 2021-07-15 DIAGNOSIS — M797 Fibromyalgia: Secondary | ICD-10-CM | POA: Insufficient documentation

## 2021-07-15 DIAGNOSIS — F419 Anxiety disorder, unspecified: Secondary | ICD-10-CM | POA: Insufficient documentation

## 2021-07-15 HISTORY — PX: NEPHROLITHOTOMY: SHX5134

## 2021-07-15 HISTORY — PX: IR URETERAL STENT RIGHT NEW ACCESS W/O SEP NEPHROSTOMY CATH: IMG6076

## 2021-07-15 LAB — BASIC METABOLIC PANEL
Anion gap: 9 (ref 5–15)
BUN: 21 mg/dL (ref 8–23)
CO2: 26 mmol/L (ref 22–32)
Calcium: 8.7 mg/dL — ABNORMAL LOW (ref 8.9–10.3)
Chloride: 100 mmol/L (ref 98–111)
Creatinine, Ser: 0.82 mg/dL (ref 0.44–1.00)
GFR, Estimated: >60 mL/min (ref >60)
Glucose, Bld: 128 mg/dL — ABNORMAL HIGH (ref 70–99)
Potassium: 3.5 mmol/L (ref 3.5–5.1)
Sodium: 135 mmol/L (ref 135–145)

## 2021-07-15 LAB — CBC
HCT: 40.5 % (ref 36.0–46.0)
HCT: 37.3 % (ref 36.0–46.0)
Hemoglobin: 13.8 g/dL (ref 12.0–15.0)
Hemoglobin: 12.5 g/dL (ref 12.0–15.0)
MCH: 31.7 pg (ref 26.0–34.0)
MCH: 31.9 pg (ref 26.0–34.0)
MCHC: 34.1 g/dL (ref 30.0–36.0)
MCHC: 33.5 g/dL (ref 30.0–36.0)
MCV: 92.9 fL (ref 80.0–100.0)
MCV: 95.2 fL (ref 80.0–100.0)
Platelets: 231 10*3/uL (ref 150–400)
Platelets: 205 10*3/uL (ref 150–400)
RBC: 4.36 MIL/uL (ref 3.87–5.11)
RBC: 3.92 MIL/uL (ref 3.87–5.11)
RDW: 12.3 % (ref 11.5–15.5)
RDW: 12.3 % (ref 11.5–15.5)
WBC: 6.7 10*3/uL (ref 4.0–10.5)
WBC: 13.2 10*3/uL — ABNORMAL HIGH (ref 4.0–10.5)
nRBC: 0 % (ref 0.0–0.2)
nRBC: 0 % (ref 0.0–0.2)

## 2021-07-15 LAB — GLUCOSE, CAPILLARY
Glucose-Capillary: 133 mg/dL — ABNORMAL HIGH (ref 70–99)
Glucose-Capillary: 122 mg/dL — ABNORMAL HIGH (ref 70–99)

## 2021-07-15 LAB — PROTIME-INR
INR: 0.9 (ref 0.8–1.2)
Prothrombin Time: 12.5 seconds (ref 11.4–15.2)

## 2021-07-15 SURGERY — NEPHROLITHOTOMY PERCUTANEOUS
Anesthesia: General | Laterality: Right

## 2021-07-15 MED ORDER — OXYBUTYNIN CHLORIDE ER 10 MG PO TB24
10.0000 mg | ORAL_TABLET | Freq: Every day | ORAL | Status: DC
  Administered 2021-07-15 – 2021-07-17 (×3): 10 mg via ORAL
  Filled 2021-07-15 (×4): qty 1

## 2021-07-15 MED ORDER — FENTANYL CITRATE (PF) 100 MCG/2ML IJ SOLN
INTRAMUSCULAR | Status: AC | PRN
  Administered 2021-07-15 (×2): 50 ug via INTRAVENOUS

## 2021-07-15 MED ORDER — 0.9 % SODIUM CHLORIDE (POUR BTL) OPTIME
TOPICAL | Status: DC | PRN
  Administered 2021-07-15: 1000 mL

## 2021-07-15 MED ORDER — SODIUM CHLORIDE 0.9 % IV SOLN: INTRAVENOUS | Status: DC

## 2021-07-15 MED ORDER — CHLORHEXIDINE GLUCONATE 0.12 % MT SOLN
15.0000 mL | Freq: Once | OROMUCOSAL | Status: AC
  Administered 2021-07-15: 15 mL via OROMUCOSAL

## 2021-07-15 MED ORDER — SODIUM CHLORIDE 0.9 % IV SOLN: 250.0000 mL | INTRAVENOUS | Status: DC | PRN

## 2021-07-15 MED ORDER — ASCORBIC ACID 500 MG PO TABS: 2000.0000 mg | ORAL_TABLET | Freq: Every day | ORAL | Status: DC | PRN

## 2021-07-15 MED ORDER — MIDAZOLAM HCL 2 MG/2ML IJ SOLN
INTRAMUSCULAR | Status: AC
  Filled 2021-07-15: qty 2

## 2021-07-15 MED ORDER — ONDANSETRON HCL 4 MG/2ML IJ SOLN
INTRAMUSCULAR | Status: DC | PRN
  Administered 2021-07-15: 4 mg via INTRAVENOUS

## 2021-07-15 MED ORDER — CHLORHEXIDINE GLUCONATE CLOTH 2 % EX PADS: 6.0000 | MEDICATED_PAD | Freq: Every day | CUTANEOUS | Status: DC

## 2021-07-15 MED ORDER — DEXAMETHASONE SODIUM PHOSPHATE 10 MG/ML IJ SOLN
INTRAMUSCULAR | Status: AC
  Filled 2021-07-15: qty 1

## 2021-07-15 MED ORDER — FENTANYL CITRATE (PF) 100 MCG/2ML IJ SOLN
INTRAMUSCULAR | Status: AC
  Filled 2021-07-15: qty 2

## 2021-07-15 MED ORDER — LIDOCAINE HCL 1 % IJ SOLN
INTRAMUSCULAR | Status: AC
  Filled 2021-07-15: qty 20

## 2021-07-15 MED ORDER — DOCUSATE SODIUM 100 MG PO CAPS
100.0000 mg | ORAL_CAPSULE | Freq: Two times a day (BID) | ORAL | Status: DC
  Administered 2021-07-15 – 2021-07-17 (×5): 100 mg via ORAL
  Filled 2021-07-15 (×5): qty 1

## 2021-07-15 MED ORDER — OXYCODONE-ACETAMINOPHEN 5-325 MG PO TABS: 1.0000 | ORAL_TABLET | ORAL | 0 refills | Status: DC | PRN

## 2021-07-15 MED ORDER — CEPHALEXIN 500 MG PO CAPS: 500.0000 mg | ORAL_CAPSULE | Freq: Two times a day (BID) | ORAL | 0 refills | Status: AC

## 2021-07-15 MED ORDER — ONDANSETRON HCL 4 MG/2ML IJ SOLN
INTRAMUSCULAR | Status: AC
  Filled 2021-07-15: qty 2

## 2021-07-15 MED ORDER — VANCOMYCIN HCL IN DEXTROSE 1-5 GM/200ML-% IV SOLN: 1000.0000 mg | Freq: Once | INTRAVENOUS | Status: DC

## 2021-07-15 MED ORDER — POTASSIUM CHLORIDE IN NACL 20-0.45 MEQ/L-% IV SOLN
INTRAVENOUS | Status: DC
  Filled 2021-07-15 (×2): qty 1000

## 2021-07-15 MED ORDER — ALBUMIN HUMAN 5 % IV SOLN: INTRAVENOUS | Status: DC | PRN

## 2021-07-15 MED ORDER — AMISULPRIDE (ANTIEMETIC) 5 MG/2ML IV SOLN: 10.0000 mg | Freq: Once | INTRAVENOUS | Status: DC | PRN

## 2021-07-15 MED ORDER — PHENYLEPHRINE 40 MCG/ML (10ML) SYRINGE FOR IV PUSH (FOR BLOOD PRESSURE SUPPORT)
PREFILLED_SYRINGE | INTRAVENOUS | Status: AC
  Filled 2021-07-15: qty 10

## 2021-07-15 MED ORDER — ALBUMIN HUMAN 5 % IV SOLN
INTRAVENOUS | Status: AC
  Filled 2021-07-15: qty 250

## 2021-07-15 MED ORDER — PROPOFOL 10 MG/ML IV BOLUS
INTRAVENOUS | Status: DC | PRN
  Administered 2021-07-15: 150 mg via INTRAVENOUS

## 2021-07-15 MED ORDER — CEFAZOLIN SODIUM-DEXTROSE 2-4 GM/100ML-% IV SOLN
INTRAVENOUS | Status: AC
  Filled 2021-07-15: qty 100

## 2021-07-15 MED ORDER — SODIUM CHLORIDE 0.9 % IV SOLN
2.0000 g | Freq: Once | INTRAVENOUS | Status: DC
  Filled 2021-07-15: qty 2000

## 2021-07-15 MED ORDER — EPHEDRINE 5 MG/ML INJ
INTRAVENOUS | Status: AC
  Filled 2021-07-15: qty 5

## 2021-07-15 MED ORDER — VANCOMYCIN HCL IN DEXTROSE 1-5 GM/200ML-% IV SOLN
1000.0000 mg | INTRAVENOUS | Status: DC
  Filled 2021-07-15: qty 200

## 2021-07-15 MED ORDER — CEFAZOLIN SODIUM-DEXTROSE 2-3 GM-%(50ML) IV SOLR
INTRAVENOUS | Status: DC | PRN
  Administered 2021-07-15: 2 g via INTRAVENOUS

## 2021-07-15 MED ORDER — SODIUM CHLORIDE 0.9 % IV SOLN
2.0000 g | Freq: Once | INTRAVENOUS | Status: DC
Start: 2021-07-15 — End: 2021-07-15

## 2021-07-15 MED ORDER — LACTATED RINGERS IV SOLN: INTRAVENOUS | Status: DC

## 2021-07-15 MED ORDER — DIPHENHYDRAMINE HCL 12.5 MG/5ML PO ELIX: 12.5000 mg | ORAL_SOLUTION | Freq: Four times a day (QID) | ORAL | Status: DC | PRN

## 2021-07-15 MED ORDER — POTASSIUM CITRATE ER 10 MEQ (1080 MG) PO TBCR
10.0000 meq | EXTENDED_RELEASE_TABLET | Freq: Three times a day (TID) | ORAL | Status: DC
  Administered 2021-07-15 – 2021-07-17 (×8): 10 meq via ORAL
  Filled 2021-07-15 (×10): qty 1

## 2021-07-15 MED ORDER — IOHEXOL 300 MG/ML  SOLN
INTRAMUSCULAR | Status: DC | PRN
  Administered 2021-07-15: 50 mL

## 2021-07-15 MED ORDER — ATENOLOL 100 MG PO TABS
100.0000 mg | ORAL_TABLET | Freq: Every day | ORAL | Status: DC
  Administered 2021-07-16 – 2021-07-17 (×2): 100 mg via ORAL
  Filled 2021-07-15 (×4): qty 1

## 2021-07-15 MED ORDER — SODIUM CHLORIDE 0.9% FLUSH: 3.0000 mL | INTRAVENOUS | Status: DC | PRN

## 2021-07-15 MED ORDER — ROCURONIUM BROMIDE 10 MG/ML (PF) SYRINGE
PREFILLED_SYRINGE | INTRAVENOUS | Status: AC
  Filled 2021-07-15: qty 10

## 2021-07-15 MED ORDER — PROMETHAZINE HCL 25 MG/ML IJ SOLN: 6.2500 mg | INTRAMUSCULAR | Status: DC | PRN

## 2021-07-15 MED ORDER — SODIUM CHLORIDE 0.9% FLUSH
3.0000 mL | Freq: Two times a day (BID) | INTRAVENOUS | Status: DC
  Administered 2021-07-15 – 2021-07-17 (×4): 3 mL via INTRAVENOUS

## 2021-07-15 MED ORDER — DEXAMETHASONE SODIUM PHOSPHATE 10 MG/ML IJ SOLN
INTRAMUSCULAR | Status: DC | PRN
Start: 2021-07-15 — End: 2021-07-15
  Administered 2021-07-15: 4 mg via INTRAVENOUS

## 2021-07-15 MED ORDER — INDAPAMIDE 1.25 MG PO TABS
2.5000 mg | ORAL_TABLET | Freq: Every day | ORAL | Status: DC
  Administered 2021-07-15 – 2021-07-17 (×3): 2.5 mg via ORAL
  Filled 2021-07-15 (×4): qty 2

## 2021-07-15 MED ORDER — IOHEXOL 300 MG/ML  SOLN
50.0000 mL | Freq: Once | INTRAMUSCULAR | Status: AC | PRN
Start: 2021-07-15 — End: 2021-07-15
  Administered 2021-07-15: 10 mL

## 2021-07-15 MED ORDER — OXYCODONE-ACETAMINOPHEN 5-325 MG PO TABS
1.0000 | ORAL_TABLET | ORAL | Status: DC | PRN
  Administered 2021-07-15: 1 via ORAL
  Filled 2021-07-15: qty 1

## 2021-07-15 MED ORDER — PHENYLEPHRINE HCL-NACL 20-0.9 MG/250ML-% IV SOLN
INTRAVENOUS | Status: DC | PRN
  Administered 2021-07-15: 20 ug/min via INTRAVENOUS

## 2021-07-15 MED ORDER — PROPOFOL 10 MG/ML IV BOLUS
INTRAVENOUS | Status: AC
  Filled 2021-07-15: qty 20

## 2021-07-15 MED ORDER — BELLADONNA ALKALOIDS-OPIUM 16.2-60 MG RE SUPP: 1.0000 | Freq: Four times a day (QID) | RECTAL | Status: DC | PRN

## 2021-07-15 MED ORDER — SODIUM CHLORIDE 0.9 % IR SOLN
Status: DC | PRN
  Administered 2021-07-15 (×5): 3000 mL

## 2021-07-15 MED ORDER — ONDANSETRON HCL 4 MG/2ML IJ SOLN: 4.0000 mg | INTRAMUSCULAR | Status: DC | PRN

## 2021-07-15 MED ORDER — FENTANYL CITRATE PF 50 MCG/ML IJ SOSY
25.0000 ug | PREFILLED_SYRINGE | INTRAMUSCULAR | Status: DC | PRN
  Administered 2021-07-15: 50 ug via INTRAVENOUS

## 2021-07-15 MED ORDER — LIDOCAINE 2% (20 MG/ML) 5 ML SYRINGE
INTRAMUSCULAR | Status: DC | PRN
  Administered 2021-07-15: 100 mg via INTRAVENOUS

## 2021-07-15 MED ORDER — CEPHALEXIN 500 MG PO CAPS: 500.0000 mg | ORAL_CAPSULE | Freq: Four times a day (QID) | ORAL | 0 refills | Status: DC

## 2021-07-15 MED ORDER — SUGAMMADEX SODIUM 200 MG/2ML IV SOLN
INTRAVENOUS | Status: DC | PRN
  Administered 2021-07-15: 200 mg via INTRAVENOUS

## 2021-07-15 MED ORDER — FENTANYL CITRATE (PF) 250 MCG/5ML IJ SOLN
INTRAMUSCULAR | Status: DC | PRN
  Administered 2021-07-15: 100 ug via INTRAVENOUS
  Administered 2021-07-15: 25 ug via INTRAVENOUS

## 2021-07-15 MED ORDER — CYCLOBENZAPRINE HCL 10 MG PO TABS
10.0000 mg | ORAL_TABLET | Freq: Three times a day (TID) | ORAL | Status: DC | PRN
  Administered 2021-07-16 (×2): 10 mg via ORAL
  Filled 2021-07-15 (×2): qty 1

## 2021-07-15 MED ORDER — LIDOCAINE 2% (20 MG/ML) 5 ML SYRINGE
INTRAMUSCULAR | Status: AC
  Filled 2021-07-15: qty 5

## 2021-07-15 MED ORDER — DIPHENHYDRAMINE HCL 50 MG/ML IJ SOLN: 12.5000 mg | Freq: Four times a day (QID) | INTRAMUSCULAR | Status: DC | PRN

## 2021-07-15 MED ORDER — MIDAZOLAM HCL 2 MG/2ML IJ SOLN
INTRAMUSCULAR | Status: AC | PRN
  Administered 2021-07-15 (×3): 1 mg via INTRAVENOUS

## 2021-07-15 MED ORDER — ORAL CARE MOUTH RINSE: 15.0000 mL | Freq: Once | OROMUCOSAL | Status: AC

## 2021-07-15 MED ORDER — RISAQUAD PO CAPS
1.0000 | ORAL_CAPSULE | Freq: Every day | ORAL | Status: DC
  Administered 2021-07-16 – 2021-07-17 (×2): 1 via ORAL
  Filled 2021-07-15 (×2): qty 1

## 2021-07-15 MED ORDER — DOCUSATE SODIUM 100 MG PO CAPS: 100.0000 mg | ORAL_CAPSULE | Freq: Every day | ORAL | 0 refills | Status: DC | PRN

## 2021-07-15 MED ORDER — ROCURONIUM BROMIDE 10 MG/ML (PF) SYRINGE
PREFILLED_SYRINGE | INTRAVENOUS | Status: DC | PRN
  Administered 2021-07-15: 70 mg via INTRAVENOUS

## 2021-07-15 MED ORDER — MORPHINE SULFATE (PF) 2 MG/ML IV SOLN
2.0000 mg | INTRAVENOUS | Status: DC | PRN
  Administered 2021-07-16: 2 mg via INTRAVENOUS
  Filled 2021-07-15: qty 1

## 2021-07-15 MED ORDER — FENTANYL CITRATE PF 50 MCG/ML IJ SOSY
PREFILLED_SYRINGE | INTRAMUSCULAR | Status: AC
  Filled 2021-07-15: qty 1

## 2021-07-15 MED ORDER — ACETAMINOPHEN 325 MG PO TABS
650.0000 mg | ORAL_TABLET | ORAL | Status: DC | PRN
  Administered 2021-07-15 – 2021-07-17 (×5): 650 mg via ORAL
  Filled 2021-07-15 (×6): qty 2

## 2021-07-15 MED ORDER — CARBOXYMETHYLCELLUL-GLYCERIN 0.5-0.9 % OP SOLN: 1.0000 [drp] | OPHTHALMIC | Status: DC | PRN

## 2021-07-15 MED ORDER — EPHEDRINE SULFATE-NACL 50-0.9 MG/10ML-% IV SOSY
PREFILLED_SYRINGE | INTRAVENOUS | Status: DC | PRN
  Administered 2021-07-15: 15 mg via INTRAVENOUS
  Administered 2021-07-15: 10 mg via INTRAVENOUS

## 2021-07-15 MED ORDER — GENTAMICIN SULFATE 40 MG/ML IJ SOLN
340.0000 mg | INTRAVENOUS | Status: AC
  Administered 2021-07-15: 340 mg via INTRAVENOUS
  Filled 2021-07-15: qty 8.5

## 2021-07-15 MED ORDER — PHENYLEPHRINE 40 MCG/ML (10ML) SYRINGE FOR IV PUSH (FOR BLOOD PRESSURE SUPPORT)
PREFILLED_SYRINGE | INTRAVENOUS | Status: DC | PRN
  Administered 2021-07-15 (×2): 40 ug via INTRAVENOUS

## 2021-07-15 SURGICAL SUPPLY — 73 items
BAG COUNTER SPONGE SURGICOUNT (BAG) IMPLANT
BAG URINE DRAIN 2000ML AR STRL (UROLOGICAL SUPPLIES) ×1 IMPLANT
BAG URO CATCHER STRL LF (MISCELLANEOUS) IMPLANT
BASKET LASER NITINOL 1.9FR (BASKET) IMPLANT
BASKET ZERO TIP NITINOL 2.4FR (BASKET) IMPLANT
BENZOIN TINCTURE PRP APPL 2/3 (GAUZE/BANDAGES/DRESSINGS) ×2 IMPLANT
BLADE SURG 15 STRL LF DISP TIS (BLADE) ×1 IMPLANT
BLADE SURG 15 STRL SS (BLADE) ×1
CATH FOLEY 2W COUNCIL 20FR 5CC (CATHETERS) IMPLANT
CATH FOLEY 2WAY SLVR  5CC 16FR (CATHETERS) ×1
CATH FOLEY 2WAY SLVR 5CC 16FR (CATHETERS) ×1 IMPLANT
CATH FOLEY LATEX FREE 20FR (CATHETERS) ×1
CATH FOLEY LF 20FR (CATHETERS) IMPLANT
CATH MULTI PURPOSE 16FR DRAIN (CATHETERS) IMPLANT
CATH ROBINSON RED A/P 20FR (CATHETERS) IMPLANT
CATH ULTRATHANE 14FR (CATHETERS) IMPLANT
CATH URETERAL DUAL LUMEN 10F (MISCELLANEOUS) ×2 IMPLANT
CATH URETL OPEN 5X70 (CATHETERS) ×2 IMPLANT
CATH URETL OPEN END 6FR 70 (CATHETERS) IMPLANT
CATH X-FORCE N30 NEPHROSTOMY (TUBING) IMPLANT
CHLORAPREP W/TINT 26 (MISCELLANEOUS) ×4 IMPLANT
DRAPE C-ARM 42X120 X-RAY (DRAPES) ×2 IMPLANT
DRAPE LINGEMAN PERC (DRAPES) ×2 IMPLANT
DRAPE SHEET LG 3/4 BI-LAMINATE (DRAPES) IMPLANT
DRAPE SURG IRRIG POUCH 19X23 (DRAPES) ×2 IMPLANT
DRSG PAD ABDOMINAL 8X10 ST (GAUZE/BANDAGES/DRESSINGS) ×4 IMPLANT
DRSG TEGADERM 4X4.75 (GAUZE/BANDAGES/DRESSINGS) IMPLANT
DRSG TEGADERM 8X12 (GAUZE/BANDAGES/DRESSINGS) ×4 IMPLANT
EXTRACTOR STONE PERC NCIRCLE (MISCELLANEOUS) ×1 IMPLANT
GAUZE SPONGE 4X4 12PLY STRL (GAUZE/BANDAGES/DRESSINGS) ×2 IMPLANT
GLOVE SURG ENC TEXT LTX SZ7.5 (GLOVE) ×2 IMPLANT
GOWN STRL REUS W/TWL LRG LVL3 (GOWN DISPOSABLE) ×2 IMPLANT
GUIDEWIRE AMPLAZ .035X145 (WIRE) IMPLANT
GUIDEWIRE STR DUAL SENSOR (WIRE) ×3 IMPLANT
GUIDEWIRE SUPER STIFF (WIRE) ×2 IMPLANT
GUIDEWIRE ZIPWRE .038 STRAIGHT (WIRE) ×2 IMPLANT
IV CATH 14GX2 1/4 (CATHETERS) ×1 IMPLANT
IV SET EXTENSION CATH 6 NF (IV SETS) IMPLANT
KIT BASIN OR (CUSTOM PROCEDURE TRAY) ×2 IMPLANT
KIT PROBE 340X3.4XDISP GRN (MISCELLANEOUS) IMPLANT
KIT PROBE TRILOGY 3.4X340 (MISCELLANEOUS)
KIT PROBE TRILOGY 3.9X350 (MISCELLANEOUS) ×2 IMPLANT
KIT TURNOVER KIT A (KITS) IMPLANT
LASER FIB FLEXIVA PULSE ID 365 (Laser) IMPLANT
MANIFOLD NEPTUNE II (INSTRUMENTS) ×2 IMPLANT
NDL TROCAR 18X15 ECHO (NEEDLE) IMPLANT
NDL TROCAR 18X20 (NEEDLE) IMPLANT
NEEDLE TROCAR 18X15 ECHO (NEEDLE) IMPLANT
NEEDLE TROCAR 18X20 (NEEDLE) IMPLANT
NS IRRIG 1000ML POUR BTL (IV SOLUTION) ×2 IMPLANT
PACK CYSTO (CUSTOM PROCEDURE TRAY) IMPLANT
SHEATH PEELAWAY SET 9 (SHEATH) IMPLANT
SPONGE T-LAP 4X18 ~~LOC~~+RFID (SPONGE) ×2 IMPLANT
STENT URET 6FRX24 CONTOUR (STENTS) ×1 IMPLANT
STENT URET 6FRX26 CONTOUR (STENTS) ×1 IMPLANT
SURGIFLO W/THROMBIN 8M KIT (HEMOSTASIS) IMPLANT
SUT SILK 0 FSL (SUTURE) ×1 IMPLANT
SUT VIC AB 4-0 PS2 18 (SUTURE) ×1 IMPLANT
SYR 10ML LL (SYRINGE) ×2 IMPLANT
SYR 20ML LL LF (SYRINGE) ×4 IMPLANT
SYR 50ML LL SCALE MARK (SYRINGE) ×2 IMPLANT
TOWEL OR 17X26 10 PK STRL BLUE (TOWEL DISPOSABLE) ×2 IMPLANT
TRACTIP FLEXIVA PULS ID 200XHI (Laser) IMPLANT
TRACTIP FLEXIVA PULSE ID 200 (Laser)
TRAY FOL W/BAG SLVR 16FR STRL (SET/KITS/TRAYS/PACK) IMPLANT
TRAY FOLEY MTR SLVR 16FR STAT (SET/KITS/TRAYS/PACK) ×1 IMPLANT
TRAY FOLEY W/BAG SLVR 16FR LF (SET/KITS/TRAYS/PACK) ×1
TUBE CONNECTING VINYL 14FR 30C (TUBING) IMPLANT
TUBING CONNECTING 10 (TUBING) ×4 IMPLANT
TUBING STONE CATCHER TRILOGY (MISCELLANEOUS) ×2 IMPLANT
TUBING UROLOGY SET (TUBING) IMPLANT
WATER STERILE IRR 1000ML POUR (IV SOLUTION) ×2 IMPLANT
WATER STERILE IRR 3000ML UROMA (IV SOLUTION) ×2 IMPLANT

## 2021-07-15 NOTE — Progress Notes (Signed)
Notified Dr Cardell Peach of contraindication with ceftrixone. New order given.

## 2021-07-15 NOTE — Transfer of Care (Signed)
Immediate Anesthesia Transfer of Care Note  Patient: Tiffany Odonnell  Procedure(s) Performed: NEPHROLITHOTOMY PERCUTANEOUS (Right)  Patient Location: PACU  Anesthesia Type:General  Level of Consciousness: drowsy and patient cooperative  Airway & Oxygen Therapy: Patient Spontanous Breathing and Patient connected to face mask oxygen  Post-op Assessment: Report given to RN and Post -op Vital signs reviewed and stable  Post vital signs: Reviewed and stable  Last Vitals:  Vitals Value Taken Time  BP 133/73 07/15/21 1512  Temp    Pulse 66 07/15/21 1514  Resp 15 07/15/21 1514  SpO2 100 % 07/15/21 1514  Vitals shown include unvalidated device data.  Last Pain:  Vitals:   07/15/21 1035  PainSc: 0-No pain         Complications: No notable events documented.

## 2021-07-15 NOTE — Op Note (Signed)
Operative Note  Preoperative diagnosis:  1.  Right renal stones  Postoperative diagnosis: 1.  Right renal stones  Procedure(s): 1. Right percutaneous nephrolithotomy (greater than 2 cm) 2. Dilation of right renal percutaneous tract under fluoroscopy 3. Right antegrade ureteroscopy 4. Right antegrade nephrostogram 5. Fluoroscopy time less than 1 hour with interpretation 6. Right ureteral stent placement 7. Right percutaneous nephrostomy tube placement 8. Cystoscopy  Surgeon: Rexene Alberts, MD  Assistants:  None  Anesthesia:  General  Complications:  None  EBL:  54ml  Specimens: 1. Stones for stone analysis (done at [facility])  Drains/Catheters: 1.   A 16-French Silastic Foley. 2.   Right 20-French Silastic nephrostomy tube 3. Right 6Fr by 26cm ureteral stent  Intraoperative findings:   2.5cm right lower pole burden. Small stretch injury of the right lower pole infundibulum. Moderate bleeding with clots during the case with at times difficult visualization. Entire stone burden treated. No fragments remaining with flexible nephroscope and ureteroscope surveillance. Successful right 6Fr x26cm right ureteral stent in place. Right nephrostomy tube in place.  Indication:  Tiffany Odonnell is a 71 y.o. female with a history of urolithiasis. Their stone burden included a 2.5cm right lower pole stone. After a thorough discussion of the risks, benefits, and alternatives of the surgery, pt agreed to proceed.  In the morning of surgery, pt went down to Interventional Radiology to get an access placed and nephroscopy films were reviewed prior to surgery today. They had good access with a 5-French nephroureteral catheter going all the way down to the bladder.  Description of procedure: After informed consent was obtained from the patient, the patient was identified, the patient was brought to the operating room and placed in supine position.   General anesthesia was administered as well as  perioperative IV antibiotics with 2g of ancef and 5mg /kg of gentamicin.  At the beginning of the case, a time-out was performed to properly identify the patient, the surgery to be performed, and the surgical site. Sequential compression devices were applied to lower extremities at the beginning of the case for DVT prophylaxis.   The patient was then placed into a prone position onto gel rolls, making sure that all pressure points were properly padded.  The patient's right flank and genitalia were then prepped and draped in sterile fashion.    I performed cystoscopy and attempted to pass a separate sensor wire up the ureter however this ureteral orifice was too tight and did not accommodate the wire. We placed a Foley sterilely in the patient in prone position.  A superstiff wire was passed through the 4-French open-ended catheter down to the bladder, which we could see under fluoroscopy. We made an incision around this wire. We then dilated the tract using a dual lumen. An antegrade nephrostogram was performed which demonstrated no extravasation of contrast and stone within renal pelvis. We then passed a 0.038 Sensor wire down to the bladder.   The Bard X force balloon was then passed over the superstiff wire until the radiopaque tip was well into the lower pole calyx.  We dilated this balloon to 18 cm of water pressure.  We advanced the sheath over the balloon.  We deflated the balloon, leaving the wire in place.   The rigid nephroscope and the Trilogy were inserted into the kidney. The stone burden was encountered. The Trilogy was used to fragement and suction out the stone. The Perc N-circle was used to remove large fragments. All fragments were removed.   Flexible  nephroscopy was performed revealing no further fragments. Repeat retrograde pyeloscopy was performed using the ureteroscope. No further stones were noted.   We advanced the ureteroscope all the way down into the bladder. There were no  stones identified along the course of the ureter. We then passed a 0.038 zip wire through the ureteroscope into the bladder.  A 6Fr x 26cm stent was then placed in the standard fashion using fluoroscopic guidance.   We then advanced a 20-Fr silastic catheter made into a council tip catheter using a 12 gauge angiocatheter over the superstiff wire into the renal pelvis. The sheath was removed. There was no significant bleeding. The remaining wires were removed. A repeat antegrade nephrostogram was performed demonstrating no filling defects and no significant extravasation of contrast. The balloon was filled with 2cc of sterile water.   We then sutured the Councill tip catheter into position using a 0 silk sutures.  The incision was closed with a 2-0 vicryl suture. Sterile 4x4's and ABD pad were placed around the nephrostomy tube.  Several large OpSite dressings were placed over this.    At this point, the procedure was completed. She was then transferred to the supine position and recovery room in stable condition. The patient tolerated the procedure well.  There were no immediate complications.  Plan:  Patient will be observed overnight. CT A/P non-con will be obtained to assess remaining stone burden. We will plan to clamp the nephrostomy tube tomorrow and remove prior to discharge tomorrow if no increased pain or fevers. Will continue gentamicin for 24 hours given positive preoperative urine culture.   [facility]  Pager: [phone]

## 2021-07-15 NOTE — Progress Notes (Signed)
Informed Dr Cardell Peach of pts allergies. Discont ampicillin and order ceftrixone.

## 2021-07-15 NOTE — Anesthesia Procedure Notes (Signed)
Procedure Name: Intubation Date/Time: 07/15/2021 12:46 PM Performed by: Eben Burow, CRNA Pre-anesthesia Checklist: Patient identified, Emergency Drugs available, Suction available, Patient being monitored and Timeout performed Patient Re-evaluated:Patient Re-evaluated prior to induction Oxygen Delivery Method: Circle system utilized Preoxygenation: Pre-oxygenation with 100% oxygen Induction Type: IV induction Ventilation: Mask ventilation without difficulty Laryngoscope Size: Mac and 4 Grade View: Grade I Tube type: Oral Tube size: 7.0 mm Number of attempts: 1 Airway Equipment and Method: Stylet Placement Confirmation: ETT inserted through vocal cords under direct vision, positive ETCO2 and breath sounds checked- equal and bilateral Secured at: 21 cm Tube secured with: Tape Dental Injury: Teeth and Oropharynx as per pre-operative assessment

## 2021-07-15 NOTE — OR Nursing (Signed)
Stones taken by Dr. Gay 

## 2021-07-15 NOTE — H&P (Signed)
Office Visit Report     07/05/2021    CC/HPI: Pt presents today for pre-operative history and physical exam in anticipation of right PCNL by Dr. Abner Greenspan on 07/15/21. She is doing well and is without complaint.   Pt denies F/C, HA, CP, SOB, N/V, diarrhea/constipation, back pain, flank pain, hematuria, and dysuria.      HX:   Tiffany Odonnell is a 71 year old female who is seen in follow-up with a history of urolithiasis, stress urinary incontinence and pelvic organ prolapse.   1. Urolithiasis:  She has a long history of urolithiasis. This has included a right PCNL in 05/2015 and many episodes of medical expulsive therapy. 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 Since that time, she has been managed with potassiums citrate 30 mEq BID and indapamide 2.5 mg daily.   Recent CT A/P 12/14/2020 demonstrates large right lower pole stone measuring up to 2.6 cm and punctate left nonobstructing stones. Renal protocol CT 03/14/2021 confirmed presence of 2.5 cm right lower pole stone. She does have intermittent vague bilateral abdominal and flank pain. She has no severe flank or abdominal pain today. She denies dysuria or gross hematuria. She is presenting today to inquire further about proposed PCNL. She does have a history of a PCNL done in [place] and is wondering if should she have this performed there as her children live there or should she have this procedure performed here.   #2. Stress urinary incontinence: She also complains of stress urinary incontinence and has been following with [facility] PT who she last saw in 08/2020. She reports that these exercises have greatly helped her symptoms. She also thinks that her bladder has distended further. She does note a sensation of a bulge however this is not that bothersome. She does have a history of a anterior repair many years ago at the [facility].   3. Recent UTI: She recently presented to the ED on 03/14/2021 and was  found to have UTI greater than 100,000 Klebsiella. She was discharged home on a course of Keflex. She denies any dysuria, fevers or chills.   Patient currently denies fever, chills, sweats, nausea, vomiting, abdominal or flank pain, gross hematuria or dysuria.     ALLERGIES: Augmentin TABS - Vomiting, Diarrhea Cipro TABS - Hives Macrobid CAPS - Vomiting Sulfa Drugs - couldn't stand and had kidney issues Tetracyclines - Dizziness    MEDICATIONS: Atenolol-Chlorthalidone 100 mg-25 mg tablet Oral  Betamethasone Diprop Augmented 0.05 % lotion  Indapamide 2.5 mg tablet 1 tablet PO Daily  Potassium Citrate Er 15 meq (1,620 mg) tablet, extended release 1 tablet PO BID  Vitamin B Complex capsule Oral  Vitamin D3 1,250 mcg (50,000 unit) capsule Oral     Notes: Liver aid OTC daily    GU PSH: Catheterization For Collection Of Specimen, Single Patient, All Places Of Service - 05/10/2021 Catheterize For Residual - 05/10/2021 Hysterectomy Unilat SO - 2016 Ovary Removal Partial or Total - 2016 Percut Stone Removal >2cm - 2016       PSH Notes: Percutaneous Lithotomy For Stone Over 2cm., Hysterectomy, Tubal Ligation, Oophorectomy, Frontal Sinusotomy, Knee Surgery Right, Bladder Surgery  rectocele done at same time of cystocele   NON-GU PSH: Knee Arthroscopy/surgery, Left Tubal Ligation - 2016     GU PMH: Urinary Tract Inf, Unspec site - 05/10/2021, Urinary tract infection due to Proteus, - 2016 Cystocele, midline - 03/29/2021, - 12/23/2020, - 11/18/2020 Renal calculus - 03/29/2021, - 12/23/2020, - 12/14/2020 (Stable), -  11/18/2020, - 07/30/2020, Right, She has a fairly large right renal calculus. It has Hounsfield units of approximately 700 and we discussed the need to evaluate her further with 24 hour urine studies once her stone has passed so I can help her prevent further stone formation. I think she is probably going to need citrate replacement but I will await the results of her 24 hour urine to  determine treatment., - 2019, Staghorn renal calculus, - 2016 Stress Incontinence - 03/29/2021, - 12/23/2020, - 11/18/2020, - 08/20/2020, - 08/13/2020, - 08/05/2020, - 07/30/2020 Ureteral calculus (Improving), She has passed her right ureteral stone. She is asymptomatic. 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 She also indicated that she was experiencing intermittent gross hematuria with activity. She has a history of Sjogren's syndrome placing her at risk for stone formation due to distal RTA associated with the syndrome.  Renal ultrasound 03/30/15 revealed right renal calculi the largest measuring 9 mm by ultrasound and no left renal calculi.  She reports a history of calculus disease having passed stones over the years primarily from the right hand side. She also indicates that in 2013 she underwent a right ureteral stent placement for stone and also has had lithotripsy.  Right PCNL 11/16: All stone was cleared except 3 minute fragments noted on her follow-up CT which were felt to be easily passable.  Stone analysis: 100% calcium phosphate consistent with her RTA.  Treatment: Potassium citrate 30 mEq b.i.d.   Left renal cyst: Renal ultrasound in 9/16 revealed what appeared to  be a left renal cyst that was small and had no blood flow but was called indeterminate by the radiologist.    DM II diet and exercise only   NON-GU PMH: Low back pain, unspecified (Stable) - 08/20/2020, - 08/13/2020, - 08/05/2020 Muscle weakness (generalized) - 08/20/2020, - 08/13/2020, - 08/05/2020 Other muscle spasm - 08/20/2020, - 08/13/2020, - 08/05/2020 Bacteriuria - 07/30/2020, She had asymptomatic bacteriuria today. Due to a lack of any symptoms treatment is not indicated., - 2020 Encounter for general adult medical examination without abnormal findings, Encounter for preventive health examination - 2016 Anxiety, Anxiety - 2016 Personal history of other diseases of the digestive system, History of irritable bowel syndrome - 2016 Personal history of other diseases of the musculoskeletal system and connective tissue, History of fibromyalgia - 2016    FAMILY HISTORY: cardiac disorder - Runs In Family Diabetes - Runs In Family   SOCIAL HISTORY: Marital Status: Divorced Current Smoking Status: Patient has never smoked.   Tobacco Use Assessment Completed: Used Tobacco in last 30 days? Has never drank.  Does not use drugs. Does not drink caffeine. Has not had a blood transfusion.     Notes: Caffeine use, Alcohol use,  Divorced, Two children, Never a smoker   REVIEW OF SYSTEMS:    GU Review Female:   Patient reports leakage of urine. Patient denies frequent urination, hard to postpone urination, burning /pain with urination, get up at night to urinate, stream starts and stops, trouble starting your stream, have to strain to urinate, and being pregnant.  Gastrointestinal (Upper):   Patient denies vomiting, indigestion/ heartburn, and nausea.  Gastrointestinal (Lower):   Patient denies diarrhea and constipation.  Constitutional:   Patient denies fever, night sweats, weight loss, and fatigue.  Skin:   Patient denies skin rash/ lesion and itching.  Eyes:   Patient denies blurred vision and double  vision.  Ears/ Nose/ Throat:   Patient denies sore throat and sinus problems.  Hematologic/Lymphatic:   Patient denies swollen glands and easy bruising.  Cardiovascular:   Patient denies leg swelling and chest pains.  Respiratory:   Patient denies cough and shortness of breath.  Endocrine:   Patient denies excessive thirst.  Musculoskeletal:   Patient denies back pain and joint pain.  Neurological:   Patient denies headaches and dizziness.  Psychologic:   Patient denies depression and anxiety.   VITAL SIGNS:      07/05/2021 02:05 PM  Weight 155 lb / 70.31 kg  Height 61 in / 154.94 cm  BP 146/83 mmHg  Heart Rate 63 /min  Temperature 97.3 F / 36.2 C  BMI 29.3 kg/m   MULTI-SYSTEM PHYSICAL EXAMINATION:    Constitutional: Well-nourished. No physical deformities. Normally developed. Good grooming.  Neck: Neck symmetrical, not swollen. Normal tracheal position.  Respiratory: Normal breath sounds. No labored breathing, no use of accessory muscles.   Cardiovascular: Regular rate and rhythm. No murmur, no gallop.   Lymphatic: No enlargement of neck, axillae, groin.  Skin: No paleness, no jaundice, no cyanosis. No lesion, no ulcer, no rash.  Neurologic / Psychiatric: Oriented to time, oriented to place, oriented to person. No depression, no anxiety, no agitation.  Gastrointestinal: No mass, no tenderness, no rigidity, non obese abdomen.  Eyes: Normal conjunctivae. Normal eyelids.  Ears, Nose, Mouth, and Throat: Left ear no scars, no lesions, no masses. Right ear no scars, no lesions, no masses. Nose no scars, no lesions, no masses. Normal hearing. Normal lips.  Musculoskeletal: Normal gait and station of head and neck.     Complexity of Data:  Records Review:   Previous Patient Records  Urine Test Review:   Urinalysis   07/05/21  Urinalysis  Urine Appearance Cloudy   Urine Color Yellow   Urine Glucose Neg mg/dL  Urine Bilirubin Neg mg/dL  Urine Ketones Neg mg/dL  Urine Specific  Gravity 1.020   Urine Blood Neg ery/uL  Urine pH 6.0   Urine Protein Trace mg/dL  Urine Urobilinogen 0.2 mg/dL  Urine Nitrites Neg   Urine Leukocyte Esterase 3+ leu/uL  Urine WBC/hpf 10 - 20/hpf   Urine RBC/hpf 0 - 2/hpf   Urine Epithelial Cells 0 - 5/hpf   Urine Bacteria Rare (0-9/hpf)   Urine Mucous Not Present   Urine Yeast NS (Not Seen)   Urine Trichomonas Not Present   Urine Cystals NS (Not Seen)   Urine Casts NS (Not Seen)   Urine Sperm Not Present    PROCEDURES:          Urinalysis w/Scope Dipstick Dipstick Cont'd Micro  Color: Yellow Bilirubin: Neg mg/dL WBC/hpf: 10 - 20/hpf  Appearance: Cloudy Ketones: Neg mg/dL RBC/hpf: 0 - 2/hpf  Specific Gravity: 1.020 Blood: Neg ery/uL Bacteria:  Rare (0-9/hpf)  pH: 6.0 Protein: Trace mg/dL Cystals: NS (Not Seen)  Glucose: Neg mg/dL Urobilinogen: 0.2 mg/dL Casts: NS (Not Seen)    Nitrites: Neg Trichomonas: Not Present    Leukocyte Esterase: 3+ leu/uL Mucous: Not Present      Epithelial Cells: 0 - 5/hpf      Yeast: NS (Not Seen)      Sperm: Not Present    ASSESSMENT:      ICD-10 Details  1 GU:   Renal calculus - N20.0    PLAN:            Medications New Meds: Cephalexin 500 mg capsule 1 capsule PO BID   #10  0 Refill(s)  Pharmacy Name:  [facility] #[id number]  Address:  [address]   [place], [place] [address]  Phone:  [phone]  Fax:  [phone]    Stop Meds: Aspirin 81 mg tablet,chewable  Discontinue: 07/05/2021  - Reason: The medication cycle was completed.  Fish Oil Omega-3 300 mg-1,000 mg capsule Oral  Start: 04/15/2015  Discontinue: 07/05/2021  - Reason: The medication cycle was completed.            Orders Labs Urine Culture          Schedule Return Visit/Planned Activity: Keep Scheduled Appointment - Schedule Surgery          Document Letter(s):  Created for Patient: Clinical Summary         Notes:   There are no changes in the patients history or physical exam since last evaluation by Dr. Abner Greenspan.  Pt is scheduled to undergo PCNL on 07/15/21.   Bacteria and WBCs noted on UA therefore will check culture to r/o infection prior to surgery.   All pt's questions were answered to the best of my ability.   Addend: Culture returned positive for K. Pneumoniae. Will tx with keflex.   Urology Preoperative H&P   Chief Complaint: right lower pole renal stone measuring 2.5cm  History of Present Illness: Tiffany Odonnell is a 71 y.o. female with right lower pole renal stone measuring 2.5cm. She was asymptomatic in the office without fevers, chills or dysuria, but preoperative urine culture 07/05/2021 grew >100K Klebsiella. She appropriately received a one week course of keflex and completed this course. She denies fevers, chills, dysuria today.    Past Medical History:  Diagnosis Date   Anxiety    Blood in urine    Cervical disc disorder    bulging disc   Diabetes mellitus without complication (HCC)    Dizziness    Falls    Fibromyalgia    H/O Salmonella gastroenteritis    High cholesterol    High cholesterol    History of frequent urinary tract infections    History of hiatal hernia    History of vertigo    Hypertension    Imbalance    Kidney stones    Lichen planopilaris    Lumbar herniated disc    L5-L6   Numbness and tingling    hands and feet bilat    Oscillopsia    Peripheral neuropathy    Septicemia ([place])    2013   Sinusitis    Sjogren's disease ([place])    Tinnitus    Wears glasses     Past Surgical History:  Procedure Laterality Date   ABDOMINAL HYSTERECTOMY     2014   KNEE ARTHROSCOPY WITH MEDIAL MENISECTOMY Left 10/29/2019   Procedure: KNEE ARTHROSCOPY WITH MEDIAL AND LATERAL MENISECTOMY WITH DEBRIDEMENT;  Surgeon: Susa Day, MD;  Location: [facility];  Service: Orthopedics;  Laterality: Left;  60 mins   NASAL SINUS SURGERY     NEPHROLITHOTOMY Right 05/31/2015   Procedure: RIGHT PERCUTANEOUS NEPHROLITHOTOMY  ;  Surgeon: Kathie Rhodes, MD;  Location: [facility];  Service:  Urology;  Laterality: Right;   prolapsed bladder and rectocele surgery      TUBAL LIGATION     1983   URETERAL STENT PLACEMENT     times 2 secondary to kidney stone     Allergies:  Allergies  Allergen Reactions   Amoxicillin-Pot Clavulanate Nausea And Vomiting   Ciprofloxacin Hives   Latex Itching   Macrolides And Ketolides Diarrhea and Nausea And Vomiting   Sulfa Antibiotics Other (See Comments)    Trembling, back pain, red eyes   Tetracyclines & Related Tinitus    Vertigo   Ibuprofen Rash    Pt denies     Family History  Problem Relation Age of Onset   CAD Mother    Diabetes Father    Non-Hodgkin's lymphoma Brother    Lupus Other    Hypertension Other    Diabetes Other    Glaucoma Maternal Uncle    Non-Hodgkin's lymphoma Brother    Non-Hodgkin's lymphoma Brother        half brother    Social History:  reports that she has never smoked. She has never used smokeless tobacco. She reports that she does not drink alcohol and does not use drugs.  ROS: A complete review of systems was performed.  All systems are negative except for pertinent findings as noted.  Physical Exam:  Vital signs in last 24 hours: Temp:  [98.3 F (36.8 C)] 98.3 F (36.8 C) (01/13 0712) Pulse Rate:  [60] 60 (01/13 0712) Resp:  [16] 16 (01/13 0712) BP: (148)/(84) 148/84 (01/13 0712) SpO2:  [98 %] 98 % (01/13 0712) Weight:  [70.3 kg] 70.3 kg (01/13 0718) Constitutional:  Alert and oriented, No acute distress Cardiovascular: Regular rate and rhythm Respiratory: Normal respiratory effort, Lungs clear bilaterally GI: Abdomen is soft, nontender, nondistended, no abdominal masses GU: No CVA tenderness Lymphatic: No lymphadenopathy Neurologic: Grossly intact, no focal deficits Psychiatric: Normal mood and affect  Laboratory Data:  Recent Labs    07/15/21 0735  WBC 6.7  HGB 13.8  HCT 40.5  PLT 231    No results for input(s): NA, K, CL, GLUCOSE, BUN, CALCIUM, CREATININE in the last 72  hours.  Invalid input(s): CO3   Results for orders placed or performed during the hospital encounter of 07/15/21 (from the past 24 hour(s))  CBC upon arrival     Status: None   Collection Time: 07/15/21  7:35 AM  Result Value Ref Range   WBC 6.7 4.0 - 10.5 K/uL   RBC 4.36 3.87 - 5.11 MIL/uL   Hemoglobin 13.8 12.0 - 15.0 g/dL   HCT 40.5 36.0 - 46.0 %   MCV 92.9 80.0 - 100.0 fL   MCH 31.7 26.0 - 34.0 pg   MCHC 34.1 30.0 - 36.0 g/dL   RDW 12.3 11.5 - 15.5 %   Platelets 231 150 - 400 K/uL   nRBC 0.0 0.0 - 0.2 %  Protime-INR upon arrival     Status: None   Collection Time: 07/15/21  7:35 AM  Result Value Ref Range   Prothrombin Time 12.5 11.4 - 15.2 seconds   INR 0.9 0.8 - 1.2  Glucose, capillary     Status: Abnormal   Collection Time:  2021/08/05  7:40 AM  Result Value Ref Range   Glucose-Capillary 133 (H) 70 - 99 mg/dL   Recent Results (from the past 240 hour(s))  SARS CORONAVIRUS 2 (TAT 6-24 HRS) Nasopharyngeal Nasopharyngeal Swab     Status: None   Collection Time: 07/13/21 12:11 PM   Specimen: Nasopharyngeal Swab  Result Value Ref Range Status   SARS Coronavirus 2 NEGATIVE NEGATIVE Final    Comment: (NOTE) SARS-CoV-2 target nucleic acids are NOT DETECTED.  The SARS-CoV-2 RNA is generally detectable in upper and lower respiratory specimens during the acute phase of infection. Negative results do not preclude SARS-CoV-2 infection, do not rule out co-infections with other pathogens, and should not be used as the sole basis for treatment or other patient management decisions. Negative results must be combined with clinical observations, patient history, and epidemiological information. The expected result is Negative.  Fact Sheet for Patients: SugarRoll.be  Fact Sheet for Healthcare Providers: https://www.woods-mathews.com/  This test is not yet approved or cleared by the [place] FDA and  has been authorized for detection  and/or diagnosis of SARS-CoV-2 by FDA under an Emergency Use Authorization (EUA). This EUA will remain  in effect (meaning this test can be used) for the duration of the COVID-19 declaration under Se ction 564(b)(1) of the Act, 21 U.S.C. section 360bbb-3(b)(1), unless the authorization is terminated or revoked sooner.  Performed at [facility], [place] [address] [place], [place] [address]     Renal Function: No results for input(s): CREATININE in the last 168 hours. CrCl cannot be calculated (Patient's most recent lab result is older than the maximum 21 days allowed.).  Radiologic Imaging: No results found.  I independently reviewed the above imaging studies.  Assessment and Plan Tiffany Odonnell is a 71 y.o. female with right lower pole renal stone measuring 2.5cm here for right PCNL.  PCNL: risks and benefits of PCNL were outlined including infection, bleeding, blood transfusion, pain, pneumothorax, bowel injury, persistent urine leak, positioning injury, inability to clear stone burden, renal laceration, arterial venous fistula or malformation, need for ancillary treatments, and global anesthesia risks including but not limited to CVA, MI, DVT, PE, pneumonia, and death.   Matt R. Ameira Alessandrini MD 08/05/2021, 9:24 AM  [facility] Pager: [phone]): [phone]

## 2021-07-15 NOTE — Anesthesia Postprocedure Evaluation (Signed)
Anesthesia Post Note  Patient: Tiffany Odonnell  Procedure(s) Performed: NEPHROLITHOTOMY PERCUTANEOUS (Right)     Patient location during evaluation: PACU Anesthesia Type: General Level of consciousness: awake and alert and oriented Pain management: pain level controlled Vital Signs Assessment: post-procedure vital signs reviewed and stable Respiratory status: spontaneous breathing, nonlabored ventilation and respiratory function stable Cardiovascular status: blood pressure returned to baseline and stable Postop Assessment: no apparent nausea or vomiting Anesthetic complications: no   No notable events documented.  Last Vitals:  Vitals:   07/15/21 1035 07/15/21 1515  BP: 117/69 128/73  Pulse: (!) 53 66  Resp: 14 15  Temp:  (!) 35.8 C  SpO2: 94% 100%    Last Pain:  Vitals:   07/15/21 1530  PainSc: Asleep                 Rayanne Padmanabhan A.

## 2021-07-15 NOTE — Procedures (Signed)
°  Procedure: R antegrade nephroureteral catheter RLP @ calculus to urinary bladder EBL:   minimal Complications:  none immediate  See full dictation in [employer].  Thora Lance MD Main # [phone] Pager  [phone] Mobile [phone]

## 2021-07-15 NOTE — Discharge Instructions (Signed)

## 2021-07-15 NOTE — Plan of Care (Signed)
  Problem: Education: Goal: Knowledge of General Education information will improve Description: Including pain rating scale, medication(s)/side effects and non-pharmacologic comfort measures Outcome: Progressing   Problem: Nutrition: Goal: Adequate nutrition will be maintained Outcome: Progressing   Problem: Coping: Goal: Level of anxiety will decrease Outcome: Progressing   

## 2021-07-15 NOTE — Progress Notes (Signed)
IVF's were tubed up from pharmacy to get started. Upon hooking pt. Up to new fluids the IV access was infiltrated and fluids have been put on hold. IV team consulted and waiting to regain access to begin fluids.

## 2021-07-16 ENCOUNTER — Observation Stay (HOSPITAL_COMMUNITY): Payer: Medicare Other

## 2021-07-16 ENCOUNTER — Encounter (HOSPITAL_COMMUNITY): Payer: Self-pay | Admitting: Urology

## 2021-07-16 DIAGNOSIS — N200 Calculus of kidney: Secondary | ICD-10-CM | POA: Diagnosis not present

## 2021-07-16 LAB — CBC
HCT: 35.2 % — ABNORMAL LOW (ref 36.0–46.0)
Hemoglobin: 11.6 g/dL — ABNORMAL LOW (ref 12.0–15.0)
MCH: 31.6 pg (ref 26.0–34.0)
MCHC: 33 g/dL (ref 30.0–36.0)
MCV: 95.9 fL (ref 80.0–100.0)
Platelets: 202 10*3/uL (ref 150–400)
RBC: 3.67 MIL/uL — ABNORMAL LOW (ref 3.87–5.11)
RDW: 12.2 % (ref 11.5–15.5)
WBC: 13.9 10*3/uL — ABNORMAL HIGH (ref 4.0–10.5)
nRBC: 0 % (ref 0.0–0.2)

## 2021-07-16 LAB — BASIC METABOLIC PANEL
Anion gap: 10 (ref 5–15)
BUN: 19 mg/dL (ref 8–23)
CO2: 24 mmol/L (ref 22–32)
Calcium: 8.5 mg/dL — ABNORMAL LOW (ref 8.9–10.3)
Chloride: 99 mmol/L (ref 98–111)
Creatinine, Ser: 0.85 mg/dL (ref 0.44–1.00)
GFR, Estimated: >60 mL/min (ref >60)
Glucose, Bld: 174 mg/dL — ABNORMAL HIGH (ref 70–99)
Potassium: 4.1 mmol/L (ref 3.5–5.1)
Sodium: 133 mmol/L — ABNORMAL LOW (ref 135–145)

## 2021-07-16 NOTE — Progress Notes (Addendum)
1 Day Post-Op Subjective: C/o well controlled expected right flank pain. No nausea or emesis. No fevers. Tolerating clamp trial without any acute worsening pain or fevers.   Objective: Vital signs in last 24 hours: Temp:  [96.4 F (35.8 C)-97.7 F (36.5 C)] 97.7 F (36.5 C) (01/14 0418) Pulse Rate:  [40-70] 56 (01/14 0418) Resp:  [9-23] 20 (01/14 0418) BP: (97-148)/(56-84) 107/57 (01/14 0418) SpO2:  [92 %-100 %] 98 % (01/14 0418) Weight:  [70.3 kg] 70.3 kg (01/13 1836)  Intake/Output from previous day: 01/13 0701 - 01/14 0700 In: 3191.8 [P.O.:240; I.V.:2543.3; IV Piggyback:408.5] Out: 2100 [Urine:2100] Intake/Output this shift: No intake/output data recorded.  Physical Exam:  General: Alert and oriented CV: RRR Lungs: Clear Abdomen: Soft, ND, ATTP; inc c/d/I Ext: NT, No erythema  Lab Results: Recent Labs    07/15/21 0735 07/15/21 1610 07/16/21 0525  HGB 13.8 12.5 11.6*  HCT 40.5 37.3 35.2*   BMET Recent Labs    07/15/21 1610 07/16/21 0525  NA 135 133*  K 3.5 4.1  CL 100 99  CO2 26 24  GLUCOSE 128* 174*  BUN 21 19  CREATININE 0.82 0.85  CALCIUM 8.7* 8.5*     Studies/Results: DG Chest Port 1 View  Result Date: 07/15/2021 CLINICAL DATA:  Postop right percutaneous nephrostomy. EXAM: PORTABLE CHEST 1 VIEW COMPARISON:  None. FINDINGS: Heart is normal in size. Patient is rotated to the right. Low lung volumes without focal consolidation or large pleural effusion. IMPRESSION: Low lung volumes without focal consolidation or pleural effusion. Electronically Signed   By: Keane Police D.O.   On: 07/15/2021 15:37   DG C-Arm 1-60 Min-No Report  Result Date: 07/15/2021 Fluoroscopy was utilized by the requesting physician.  No radiographic interpretation.   DG C-Arm 1-60 Min-No Report  Result Date: 07/15/2021 Fluoroscopy was utilized by the requesting physician.  No radiographic interpretation.   DG C-Arm 1-60 Min-No Report  Result Date: 07/15/2021 Fluoroscopy  was utilized by the requesting physician.  No radiographic interpretation.   IR URETERAL STENT RIGHT NEW ACCESS W/O SEP NEPHROSTOMY CATH  Result Date: 07/15/2021 CLINICAL DATA:  Symptomatic right nephrolithiasis, planned percutaneous nephrolithotomy EXAM: RIGHT PERCUTANEOUS NEPHROURETERAL CATHETER PLACEMENT UNDER FLUOROSCOPIC GUIDANCE FLUOROSCOPY TIME:  3 minute 30 seconds; 30 mGy TECHNIQUE: The procedure, risks (including but not limited to bleeding, infection, organ damage ), benefits, and alternatives were explained to the patient. Questions regarding the procedure were encouraged and answered. The patient understands and consents to the procedure. Right flank region prepped with Betadine, draped in usual sterile fashion, infiltrated locally with 1% lidocaine. Intravenous Fentanyl 111mcg and Versed 3mg  were administered as conscious sedation during continuous monitoring of the patient's level of consciousness and physiological / cardiorespiratory status by the radiology RN, with a total moderate sedation time of 15 minutes. Under real-time fluoroscopic guidance, a 21-gauge trocar needle was advanced into a posterior lower pole calyx using the peripheral radiodense calculus as a guide. A 018 guidewire advanced easily into the renal collecting system centrally . Needle was exchanged over a guidewire for transitional dilator. Contrast injection confirmed appropriate positioning. Catheter was exchanged over a guidewire for a 5 [place] Kumpe catheter, directed down the ureter and advanced into the urinary bladder. Radiograph confirms appropriate nephroureteral catheter positioning. Catheter capped and secured externally. The patient tolerated the procedure well. COMPLICATIONS: COMPLICATIONS None. IMPRESSION: 1. Technically successful right antegrade percutaneous nephroureteral catheter placement. Electronically Signed   By: Lucrezia Europe M.D.   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 -Discharge home tomorrow   LOS: 0 days   Matt R. Abbygayle Helfand MD 07/16/2021, 7:40 AM [facility]  Pager: [phone]

## 2021-07-16 NOTE — Plan of Care (Signed)

## 2021-07-16 NOTE — Progress Notes (Signed)
Patient nephrostomy tube was clamped at 0400 per MD instruction according to day shift nurse. Unclamped at this time after CT completed.

## 2021-07-17 DIAGNOSIS — N200 Calculus of kidney: Secondary | ICD-10-CM | POA: Diagnosis not present

## 2021-07-17 MED ORDER — GENTAMICIN SULFATE 40 MG/ML IJ SOLN
340.0000 mg | INTRAVENOUS | Status: AC
  Administered 2021-07-18: 340 mg via INTRAVENOUS
  Filled 2021-07-17: qty 8.5

## 2021-07-17 MED ORDER — POLYETHYLENE GLYCOL 3350 17 G PO PACK
17.0000 g | PACK | Freq: Every day | ORAL | Status: DC | PRN
  Administered 2021-07-17: 17 g via ORAL
  Filled 2021-07-17: qty 1

## 2021-07-17 NOTE — Plan of Care (Signed)

## 2021-07-17 NOTE — Progress Notes (Signed)
2 Days Post-Op Subjective: No acute events overnight. Patient continues to endorse pain. Patient is tolerating regular diet without issue. Ambulated x 1 yesterday  Objective: Vital signs in last 24 hours: Temp:  [98.2 F (36.8 C)-99 F (37.2 C)] 98.7 F (37.1 C) (01/15 0500) Pulse Rate:  [58-62] 59 (01/15 0500) Resp:  [13-19] 13 (01/15 0500) BP: (106-134)/(50-74) 115/50 (01/15 0500) SpO2:  [93 %-100 %] 93 % (01/15 0500)  Intake/Output from previous day: 01/14 0701 - 01/15 0700 In: -  Out: 900 [Urine:900] Intake/Output this shift: No intake/output data recorded.  Physical Exam:  General: Alert and oriented CV: RRR Lungs: Clear Abdomen: Soft, ND, ATTP; inc c/d/I with bandage in place Ext: NT, No erythema  Lab Results: Recent Labs    07/15/21 0735 07/15/21 1610 07/16/21 0525  HGB 13.8 12.5 11.6*  HCT 40.5 37.3 35.2*   BMET Recent Labs    07/15/21 1610 07/16/21 0525  NA 135 133*  K 3.5 4.1  CL 100 99  CO2 26 24  GLUCOSE 128* 174*  BUN 21 19  CREATININE 0.82 0.85  CALCIUM 8.7* 8.5*     Studies/Results: CT ABDOMEN WO CONTRAST  Result Date: 07/16/2021 CLINICAL DATA:  Renal stones EXAM: CT ABDOMEN WITHOUT CONTRAST TECHNIQUE: Multidetector CT imaging of the abdomen was performed following the standard protocol without IV contrast. RADIATION DOSE REDUCTION: This exam was performed according to the departmental dose-optimization program which includes automated exposure control, adjustment of the mA and/or kV according to patient size and/or use of iterative reconstruction technique. COMPARISON:  03/14/2021 FINDINGS: Lower chest: Linear infiltrates are seen in the lower lung fields with air bronchogram. This may suggest subsegmental atelectasis. Coronary artery calcifications are seen. Hepatobiliary: There is 8 mm low-density in the left lobe of liver which has not changed significantly. Gallbladder is distended. There is no wall thickening. There is no dilation of bile  ducts. Pancreas: No focal abnormality is seen Spleen: Unremarkable Adrenals/Urinary Tract: Adrenals are unremarkable. There is no hydronephrosis. Percutaneous nephrostomy catheter is seen in the right kidney. Right ureteral stent is noted. There are few small right renal stones each measuring less than 3 mm. In the previous study, a large calculus was seen in the lower pole of right kidney which is not evident in the current study, possibly due to interval lithotripsy. Multiple small calculi seen in the lower pole in the current study may be residual from lithotripsy. There is air in the lumen of collecting system in the right kidney, possibly due to recent intervention. There are few small left renal stones in the mid and lower portions each measuring less than 3 mm. Ureters are not dilated. There is right perinephric stranding, possibly related to recent intervention. Stomach/Bowel: Stomach is not distended. Visualized small bowel loops are unremarkable. Cecum is not included in the study. There is no significant wall thickening in the visualized portions of colon. Vascular/Lymphatic: Unremarkable. Other: There is no ascites or pneumoperitoneum. Musculoskeletal: There is mild anterolisthesis at L3-L4 level. There is encroachment of neural foramina at L3-L4 and L4-L5 levels. IMPRESSION: Large calculus seen in the lower pole of right kidney is not evident. There are multiple tiny bilateral renal stones. Right percutaneous nephrostomy catheter is noted in place. Right ureteral stent is seen. Right perinephric stranding may be related to recent lithotripsy. There is no hydronephrosis. There are linear densities in both lower lung fields suggesting subsegmental atelectasis. Coronary artery calcifications are seen. There is subcentimeter low-density in the left lobe of liver, possibly cyst  or hemangioma. There is mild anterolisthesis at L3-L4 level. There is encroachment of neural foramina at multiple levels in the  lumbar spine. Electronically Signed   By: Elmer Picker M.D.   On: 07/16/2021 08:33   DG Chest Port 1 View  Result Date: 07/15/2021 CLINICAL DATA:  Postop right percutaneous nephrostomy. EXAM: PORTABLE CHEST 1 VIEW COMPARISON:  None. FINDINGS: Heart is normal in size. Patient is rotated to the right. Low lung volumes without focal consolidation or large pleural effusion. IMPRESSION: Low lung volumes without focal consolidation or pleural effusion. Electronically Signed   By: Keane Police D.O.   On: 07/15/2021 15:37   DG C-Arm 1-60 Min-No Report  Result Date: 07/15/2021 Fluoroscopy was utilized by the requesting physician.  No radiographic interpretation.   DG C-Arm 1-60 Min-No Report  Result Date: 07/15/2021 Fluoroscopy was utilized by the requesting physician.  No radiographic interpretation.   DG C-Arm 1-60 Min-No Report  Result Date: 07/15/2021 Fluoroscopy was utilized by the requesting physician.  No radiographic interpretation.   IR URETERAL STENT RIGHT NEW ACCESS W/O SEP NEPHROSTOMY CATH  Result Date: 07/15/2021 CLINICAL DATA:  Symptomatic right nephrolithiasis, planned percutaneous nephrolithotomy EXAM: RIGHT PERCUTANEOUS NEPHROURETERAL CATHETER PLACEMENT UNDER FLUOROSCOPIC GUIDANCE FLUOROSCOPY TIME:  3 minute 30 seconds; 30 mGy TECHNIQUE: The procedure, risks (including but not limited to bleeding, infection, organ damage ), benefits, and alternatives were explained to the patient. Questions regarding the procedure were encouraged and answered. The patient understands and consents to the procedure. Right flank region prepped with Betadine, draped in usual sterile fashion, infiltrated locally with 1% lidocaine. Intravenous Fentanyl 136mcg and Versed 3mg  were administered as conscious sedation during continuous monitoring of the patient's level of consciousness and physiological / cardiorespiratory status by the radiology RN, with a total moderate sedation time of 15 minutes. Under  real-time fluoroscopic guidance, a 21-gauge trocar needle was advanced into a posterior lower pole calyx using the peripheral radiodense calculus as a guide. A 018 guidewire advanced easily into the renal collecting system centrally . Needle was exchanged over a guidewire for transitional dilator. Contrast injection confirmed appropriate positioning. Catheter was exchanged over a guidewire for a 5 [place] Kumpe catheter, directed down the ureter and advanced into the urinary bladder. Radiograph confirms appropriate nephroureteral catheter positioning. Catheter capped and secured externally. The patient tolerated the procedure well. COMPLICATIONS: COMPLICATIONS None. IMPRESSION: 1. Technically successful right antegrade percutaneous nephroureteral catheter placement. Electronically Signed   By: Lucrezia Europe M.D.   On: 07/15/2021 10:40    Assessment/Plan: 71 yo F s/p R PCNL POD 2, continues to endorse pain --ambulate x 3 and most of day in chair --likely home tomorrow    Donald Pore MD 07/17/2021, 8:58 AM [facility]  Pager: [phone]

## 2021-07-17 NOTE — Progress Notes (Signed)
Pt complains of constipation (last BM 07/14/2021). Nurse notified and Miralax ordered by Lafonda Mosses, MD.

## 2021-07-17 NOTE — Progress Notes (Signed)
Pt was able to ambulate 452ft with walker and assistance. Tolerated well and denies SOB and pain.

## 2021-07-17 NOTE — Progress Notes (Signed)
Pt was able to ambulate 275ft with walker and assistance. Tolerated well and encouraged to attempt 1x more at 1200. Denies SOB and pain.

## 2021-07-18 DIAGNOSIS — N200 Calculus of kidney: Secondary | ICD-10-CM | POA: Diagnosis not present

## 2021-07-18 NOTE — TOC CM/SW Note (Signed)
°  Transition of Care [facility]) Screening Note   Patient Details  Name: Tiffany Odonnell Date of Birth: [birth year]   Transition of Care [facility]) CM/SW Contact:    Darleene Cleaver, LCSW Phone Number: 07/18/2021, 11:30 AM    Transition of Care Department [facility]) has reviewed patient and no TOC needs have been identified at this time. We will continue to monitor patient advancement through interdisciplinary progression rounds. If new patient transition needs arise, please place a TOC consult.

## 2021-07-18 NOTE — Progress Notes (Signed)
RN reviewed discharge paperwork with pt. IV removed. All questions addressed. No further needs. Pt left via private vehicle with granddaughter.

## 2021-07-18 NOTE — Discharge Summary (Signed)
Date of admission: 07/15/2021  Date of discharge: 07/18/2021  Admission diagnosis: Right renal stone  Discharge diagnosis: Right renal stone  Secondary diagnoses: none  History and Physical: For full details, please see admission history and physical. Briefly, Tiffany Odonnell is a 71 y.o. year old patient with a large right renal stone who underwent right PCNL on 07/15/2021.   Hospital Course: The patient recovered in the usual expected fashion.  She had her diet advanced slowly.  Initially managed with IV pain control, then transitioned to PO meds when she was tolerating oral intake.  Her labs were stable throughout the hospital course.  She was discharged to home on POD#3.  At the time of discharge she was tolerating a regular diet, passing flatus, ambulating, had adequate pain control and was agreeable to discharge.  Follow up as scheduled.    Laboratory values:  Recent Labs    07/15/21 1610 07/16/21 0525  HGB 12.5 11.6*  HCT 37.3 35.2*   Recent Labs    07/15/21 1610 07/16/21 0525  CREATININE 0.82 0.85    Disposition: Home  Discharge instruction: The patient was instructed to be ambulatory but told to refrain from heavy lifting, strenuous activity, or driving.   Discharge medications:  Allergies as of 07/18/2021       Reactions   Amoxicillin-pot Clavulanate Nausea And Vomiting   Ciprofloxacin Hives   Latex Itching   Macrolides And Ketolides Diarrhea, Nausea And Vomiting   Sulfa Antibiotics Other (See Comments)   Trembling, back pain, red eyes   Tetracyclines & Related Tinitus   Vertigo   Ibuprofen Rash   Pt denies         Medication List     TAKE these medications    acidophilus Caps capsule Take 1 capsule by mouth daily.   aspirin EC 81 MG tablet Take 1 tablet (81 mg total) by mouth daily.   atenolol 100 MG tablet Commonly known as: TENORMIN Take 100 mg by mouth daily.   betamethasone dipropionate 0.05 % lotion Apply 1 application topically 2 (two)  times daily as needed (rash). For scalp   cephALEXin 500 MG capsule Commonly known as: KEFLEX Take 1 capsule (500 mg total) by mouth 2 (two) times daily for 6 doses. Take day prior, day of and day after stent removal in the office scheduled in 2 weeks.   cyclobenzaprine 10 MG tablet Commonly known as: FLEXERIL Take 10 mg by mouth 3 (three) times daily as needed for muscle spasms.   desonide 0.05 % cream Commonly known as: DESOWEN Apply 1 application topically 2 (two) times daily as needed (rash).   docusate sodium 100 MG capsule Commonly known as: Colace Take 1 capsule (100 mg total) by mouth daily as needed for up to 30 doses.   DULoxetine 30 MG capsule Commonly known as: CYMBALTA TAKE 1 CAPSULE BY MOUTH EVERY DAY   hydrocortisone 2.5 % rectal cream Commonly known as: ANUSOL-HC Place 1 application rectally 3 (three) times daily as needed for hemorrhoids.   indapamide 2.5 MG tablet Commonly known as: LOZOL Take 2.5 mg by mouth daily.   LUBRICATING EYE DROPS OP Place 1 drop into both eyes daily as needed (dry eyes).   MAGNESIUM PO Take 1 tablet by mouth 2 (two) times a week.   meloxicam 7.5 MG tablet Commonly known as: Mobic Take 1 tablet (7.5 mg total) by mouth daily as needed for pain.   oxyCODONE-acetaminophen 5-325 MG tablet Commonly known as: Percocet Take 1 tablet by mouth every  4 (four) hours as needed for up to 18 doses for severe pain.   Potassium Citrate 15 MEQ (1620 MG) Tbcr Take 15 mEq by mouth 2 (two) times daily.   vitamin C 1000 MG tablet Take 2,000 mg by mouth daily as needed (immune support).   VITAMIN D3 PO Take 1 tablet by mouth 2 (two) times a week.   zinc gluconate 50 MG tablet Take 50 mg by mouth 2 (two) times a week.        Followup:   Follow-up Information     [facility] Follow up on 07/28/2021.   Why: 8:45AM Contact information: [address] [place] [address] [phone]                 Matt R. Gregoire Bennis MD [facility]  Pager: [phone]

## 2021-08-01 ENCOUNTER — Emergency Department (HOSPITAL_COMMUNITY): Payer: Medicare Other

## 2021-08-01 ENCOUNTER — Other Ambulatory Visit: Payer: Self-pay

## 2021-08-01 ENCOUNTER — Encounter (HOSPITAL_COMMUNITY): Payer: Self-pay | Admitting: Emergency Medicine

## 2021-08-01 ENCOUNTER — Inpatient Hospital Stay (HOSPITAL_COMMUNITY)
Admission: EM | Admit: 2021-08-01 | Discharge: 2021-08-11 | DRG: 234 | Disposition: A | Discharge status: Home / Self Care | Payer: Medicare Other | Attending: Surgery | Admitting: Surgery

## 2021-08-01 DIAGNOSIS — L439 Lichen planus, unspecified: Secondary | ICD-10-CM | POA: Diagnosis not present

## 2021-08-01 DIAGNOSIS — Z79899 Other long term (current) drug therapy: Secondary | ICD-10-CM | POA: Diagnosis not present

## 2021-08-01 DIAGNOSIS — Z87440 Personal history of urinary (tract) infections: Secondary | ICD-10-CM | POA: Diagnosis not present

## 2021-08-01 DIAGNOSIS — F419 Anxiety disorder, unspecified: Secondary | ICD-10-CM | POA: Diagnosis present

## 2021-08-01 DIAGNOSIS — Z20822 Contact with and (suspected) exposure to covid-19: Secondary | ICD-10-CM | POA: Diagnosis present

## 2021-08-01 DIAGNOSIS — Z888 Allergy status to other drugs, medicaments and biological substances status: Secondary | ICD-10-CM | POA: Diagnosis not present

## 2021-08-01 DIAGNOSIS — E1142 Type 2 diabetes mellitus with diabetic polyneuropathy: Secondary | ICD-10-CM | POA: Diagnosis present

## 2021-08-01 DIAGNOSIS — Z882 Allergy status to sulfonamides status: Secondary | ICD-10-CM | POA: Diagnosis not present

## 2021-08-01 DIAGNOSIS — Z951 Presence of aortocoronary bypass graft: Secondary | ICD-10-CM | POA: Diagnosis not present

## 2021-08-01 DIAGNOSIS — E7800 Pure hypercholesterolemia, unspecified: Secondary | ICD-10-CM | POA: Diagnosis present

## 2021-08-01 DIAGNOSIS — I480 Paroxysmal atrial fibrillation: Secondary | ICD-10-CM | POA: Diagnosis not present

## 2021-08-01 DIAGNOSIS — Z880 Allergy status to penicillin: Secondary | ICD-10-CM | POA: Diagnosis not present

## 2021-08-01 DIAGNOSIS — E876 Hypokalemia: Secondary | ICD-10-CM | POA: Diagnosis not present

## 2021-08-01 DIAGNOSIS — I214 Non-ST elevation (NSTEMI) myocardial infarction: Principal | ICD-10-CM | POA: Diagnosis present

## 2021-08-01 DIAGNOSIS — K5900 Constipation, unspecified: Secondary | ICD-10-CM | POA: Diagnosis not present

## 2021-08-01 DIAGNOSIS — Z01810 Encounter for preprocedural cardiovascular examination: Secondary | ICD-10-CM | POA: Diagnosis not present

## 2021-08-01 DIAGNOSIS — I4891 Unspecified atrial fibrillation: Secondary | ICD-10-CM | POA: Diagnosis not present

## 2021-08-01 DIAGNOSIS — Z09 Encounter for follow-up examination after completed treatment for conditions other than malignant neoplasm: Secondary | ICD-10-CM

## 2021-08-01 DIAGNOSIS — M797 Fibromyalgia: Secondary | ICD-10-CM | POA: Diagnosis present

## 2021-08-01 DIAGNOSIS — E119 Type 2 diabetes mellitus without complications: Secondary | ICD-10-CM | POA: Diagnosis not present

## 2021-08-01 DIAGNOSIS — Z87442 Personal history of urinary calculi: Secondary | ICD-10-CM | POA: Diagnosis not present

## 2021-08-01 DIAGNOSIS — I2510 Atherosclerotic heart disease of native coronary artery without angina pectoris: Secondary | ICD-10-CM | POA: Diagnosis present

## 2021-08-01 DIAGNOSIS — Z881 Allergy status to other antibiotic agents status: Secondary | ICD-10-CM | POA: Diagnosis not present

## 2021-08-01 DIAGNOSIS — I10 Essential (primary) hypertension: Secondary | ICD-10-CM | POA: Diagnosis present

## 2021-08-01 DIAGNOSIS — M5126 Other intervertebral disc displacement, lumbar region: Secondary | ICD-10-CM | POA: Diagnosis present

## 2021-08-01 DIAGNOSIS — I160 Hypertensive urgency: Secondary | ICD-10-CM | POA: Diagnosis not present

## 2021-08-01 DIAGNOSIS — J90 Pleural effusion, not elsewhere classified: Secondary | ICD-10-CM

## 2021-08-01 DIAGNOSIS — M3500 Sicca syndrome, unspecified: Secondary | ICD-10-CM | POA: Diagnosis present

## 2021-08-01 LAB — HEPARIN LEVEL (UNFRACTIONATED): Heparin Unfractionated: 0.42 IU/mL (ref 0.30–0.70)

## 2021-08-01 LAB — CBC
HCT: 37.4 % (ref 36.0–46.0)
Hemoglobin: 12.5 g/dL (ref 12.0–15.0)
MCH: 31.3 pg (ref 26.0–34.0)
MCHC: 33.4 g/dL (ref 30.0–36.0)
MCV: 93.5 fL (ref 80.0–100.0)
Platelets: 285 10*3/uL (ref 150–400)
RBC: 4 MIL/uL (ref 3.87–5.11)
RDW: 12.1 % (ref 11.5–15.5)
WBC: 7 10*3/uL (ref 4.0–10.5)
nRBC: 0 % (ref 0.0–0.2)

## 2021-08-01 LAB — BASIC METABOLIC PANEL
Anion gap: 12 (ref 5–15)
BUN: 22 mg/dL (ref 8–23)
CO2: 23 mmol/L (ref 22–32)
Calcium: 9.1 mg/dL (ref 8.9–10.3)
Chloride: 99 mmol/L (ref 98–111)
Creatinine, Ser: 0.99 mg/dL (ref 0.44–1.00)
GFR, Estimated: >60 mL/min (ref >60)
Glucose, Bld: 134 mg/dL — ABNORMAL HIGH (ref 70–99)
Potassium: 3.2 mmol/L — ABNORMAL LOW (ref 3.5–5.1)
Sodium: 134 mmol/L — ABNORMAL LOW (ref 135–145)

## 2021-08-01 LAB — RESP PANEL BY RT-PCR (FLU A&B, COVID) ARPGX2
Influenza A by PCR: NEGATIVE
Influenza B by PCR: NEGATIVE
SARS Coronavirus 2 by RT PCR: NEGATIVE

## 2021-08-01 LAB — HEMOGLOBIN A1C
Hgb A1c MFr Bld: 5.9 % — ABNORMAL HIGH (ref 4.8–5.6)
Mean Plasma Glucose: 122.63 mg/dL

## 2021-08-01 LAB — TROPONIN I (HIGH SENSITIVITY)
Troponin I (High Sensitivity): 73 ng/L — ABNORMAL HIGH (ref <18)
Troponin I (High Sensitivity): 433 ng/L (ref <18)

## 2021-08-01 LAB — MAGNESIUM: Magnesium: 1.7 mg/dL (ref 1.7–2.4)

## 2021-08-01 MED ORDER — SODIUM CHLORIDE 0.9% FLUSH
3.0000 mL | Freq: Two times a day (BID) | INTRAVENOUS | Status: DC
  Administered 2021-08-03 – 2021-08-05 (×2): 3 mL via INTRAVENOUS

## 2021-08-01 MED ORDER — CARBOXYMETHYLCELLULOSE SODIUM 0.25 % OP SOLN: 1.0000 [drp] | Freq: Two times a day (BID) | OPHTHALMIC | Status: DC | PRN

## 2021-08-01 MED ORDER — ATORVASTATIN CALCIUM 40 MG PO TABS
40.0000 mg | ORAL_TABLET | Freq: Every day | ORAL | Status: DC
  Filled 2021-08-01 (×2): qty 1

## 2021-08-01 MED ORDER — SODIUM CHLORIDE 0.9 % IV SOLN: 250.0000 mL | INTRAVENOUS | Status: DC | PRN

## 2021-08-01 MED ORDER — POTASSIUM CHLORIDE CRYS ER 20 MEQ PO TBCR
40.0000 meq | EXTENDED_RELEASE_TABLET | Freq: Once | ORAL | Status: AC
  Administered 2021-08-01: 40 meq via ORAL
  Filled 2021-08-01: qty 2

## 2021-08-01 MED ORDER — CYCLOBENZAPRINE HCL 5 MG PO TABS
2.5000 mg | ORAL_TABLET | Freq: Three times a day (TID) | ORAL | Status: DC | PRN
  Filled 2021-08-01: qty 1

## 2021-08-01 MED ORDER — POTASSIUM CHLORIDE CRYS ER 20 MEQ PO TBCR
20.0000 meq | EXTENDED_RELEASE_TABLET | Freq: Every day | ORAL | Status: DC
  Administered 2021-08-01 – 2021-08-04 (×4): 20 meq via ORAL
  Filled 2021-08-01 (×4): qty 1

## 2021-08-01 MED ORDER — ONDANSETRON HCL 4 MG/2ML IJ SOLN: 4.0000 mg | Freq: Four times a day (QID) | INTRAMUSCULAR | Status: DC | PRN

## 2021-08-01 MED ORDER — NITROGLYCERIN 0.4 MG SL SUBL: 0.4000 mg | SUBLINGUAL_TABLET | SUBLINGUAL | Status: DC | PRN

## 2021-08-01 MED ORDER — CARVEDILOL 3.125 MG PO TABS
12.5000 mg | ORAL_TABLET | Freq: Two times a day (BID) | ORAL | Status: DC
  Filled 2021-08-01: qty 4

## 2021-08-01 MED ORDER — INSULIN ASPART 100 UNIT/ML IJ SOLN: 0.0000 [IU] | Freq: Every day | INTRAMUSCULAR | Status: DC

## 2021-08-01 MED ORDER — HEPARIN BOLUS VIA INFUSION
3700.0000 [IU] | Freq: Once | INTRAVENOUS | Status: AC
  Administered 2021-08-01: 3700 [IU] via INTRAVENOUS
  Filled 2021-08-01: qty 3700

## 2021-08-01 MED ORDER — ASPIRIN EC 81 MG PO TBEC
81.0000 mg | DELAYED_RELEASE_TABLET | Freq: Every day | ORAL | Status: DC
  Administered 2021-08-02 – 2021-08-04 (×3): 81 mg via ORAL
  Filled 2021-08-01 (×3): qty 1

## 2021-08-01 MED ORDER — ZOLPIDEM TARTRATE 5 MG PO TABS
5.0000 mg | ORAL_TABLET | Freq: Every evening | ORAL | Status: DC | PRN
  Filled 2021-08-01: qty 1

## 2021-08-01 MED ORDER — ALPRAZOLAM 0.25 MG PO TABS: 0.2500 mg | ORAL_TABLET | Freq: Two times a day (BID) | ORAL | Status: DC | PRN

## 2021-08-01 MED ORDER — DOCUSATE SODIUM 100 MG PO CAPS: 100.0000 mg | ORAL_CAPSULE | Freq: Every day | ORAL | Status: DC | PRN

## 2021-08-01 MED ORDER — SODIUM CHLORIDE 0.9% FLUSH: 3.0000 mL | INTRAVENOUS | Status: DC | PRN

## 2021-08-01 MED ORDER — MAGNESIUM OXIDE -MG SUPPLEMENT 400 (240 MG) MG PO TABS
400.0000 mg | ORAL_TABLET | Freq: Every day | ORAL | Status: DC
  Administered 2021-08-01 – 2021-08-04 (×4): 400 mg via ORAL
  Filled 2021-08-01 (×4): qty 1

## 2021-08-01 MED ORDER — INSULIN ASPART 100 UNIT/ML IJ SOLN: 0.0000 [IU] | Freq: Three times a day (TID) | INTRAMUSCULAR | Status: DC

## 2021-08-01 MED ORDER — HEPARIN (PORCINE) 25000 UT/250ML-% IV SOLN
800.0000 [IU]/h | INTRAVENOUS | Status: DC
  Administered 2021-08-01: 800 [IU]/h via INTRAVENOUS
  Filled 2021-08-01: qty 250

## 2021-08-01 MED ORDER — ACETAMINOPHEN 325 MG PO TABS
650.0000 mg | ORAL_TABLET | ORAL | Status: DC | PRN
  Administered 2021-08-02: 650 mg via ORAL
  Filled 2021-08-01: qty 2

## 2021-08-01 NOTE — Progress Notes (Signed)
ANTICOAGULATION CONSULT NOTE Pharmacy Consult for heparin Indication: chest pain/ACS Brief A/P: Heparin level within goal range Continue Heparin at current rate   Allergies  Allergen Reactions   Amoxicillin-Pot Clavulanate Nausea And Vomiting   Ciprofloxacin Hives   Sulfa Antibiotics Other (See Comments)    Trembling, paralyzing pain, red eyes, skin crawling   Latex Itching   Macrolides And Ketolides Diarrhea and Nausea And Vomiting   Tetracyclines & Related Other (See Comments) and Tinitus    Vertigo    Patient Measurements: Height: 5\' 1"  (154.9 cm) Weight: 70.3 kg (155 lb) IBW/kg (Calculated) : 47.8 Heparin Dosing Weight: 63kg  Vital Signs: Temp: 97.9 F (36.6 C) (01/30 1253) Temp Source: Oral (01/30 1253) BP: 111/65 (01/30 2300) Pulse Rate: 59 (01/30 2315)  Labs: Recent Labs    08/01/21 1321 08/01/21 1522 08/01/21 2348  HGB 12.5  --   --   HCT 37.4  --   --   PLT 285  --   --   HEPARINUNFRC  --   --  0.42  CREATININE 0.99  --   --   TROPONINIHS 73* 433*  --      Estimated Creatinine Clearance: 47.4 mL/min (by C-G formula based on SCr of 0.99 mg/dL).  Assessment: 71 y.o. female with chest pain for heparin   Goal of Therapy:  Heparin level 0.3-0.7 units/ml Monitor platelets by anticoagulation protocol: Yes   Plan:  Continue Heparin at current rate  Follow-up am labs.   66, PharmD, BCPS

## 2021-08-01 NOTE — Progress Notes (Signed)
ANTICOAGULATION CONSULT NOTE - Initial Consult  Pharmacy Consult for heparin Indication: chest pain/ACS  Allergies  Allergen Reactions   Amoxicillin-Pot Clavulanate Nausea And Vomiting   Ciprofloxacin Hives   Latex Itching   Macrolides And Ketolides Diarrhea and Nausea And Vomiting   Sulfa Antibiotics Other (See Comments)    Trembling, back pain, red eyes   Tetracyclines & Related Tinitus    Vertigo   Ibuprofen Rash    Pt denies     Patient Measurements: Height: 5\' 1"  (154.9 cm) Weight: Tiffany.3 kg (155 lb) IBW/kg (Calculated) : 47.8 Heparin Dosing Weight: 63kg  Vital Signs: Temp: 97.9 F (36.6 C) (01/30 1253) Temp Source: Oral (01/30 1253) BP: 122/74 (01/30 1430) Pulse Rate: 48 (01/30 1430)  Labs: Recent Labs    08/01/21 1321  HGB 12.5  HCT 37.4  PLT 285  CREATININE 0.99  TROPONINIHS 73*    Estimated Creatinine Clearance: 47.4 mL/min (by C-G formula based on SCr of 0.99 mg/dL).   Medical History: Past Medical History:  Diagnosis Date   Anxiety    Blood in urine    Cervical disc disorder    bulging disc   Diabetes mellitus without complication (HCC)    Dizziness    Falls    Fibromyalgia    H/O Salmonella gastroenteritis    High cholesterol    High cholesterol    History of frequent urinary tract infections    History of hiatal hernia    History of vertigo    Hypertension    Imbalance    Kidney stones    Lichen planopilaris    Lumbar herniated disc    L5-L6   Numbness and tingling    hands and feet bilat    Oscillopsia    Peripheral neuropathy    Septicemia (HCC)    2013   Sinusitis    Sjogren's disease (HCC)    Tinnitus    Wears glasses      Assessment: Tiffany Odonnell presenting with CP, elevated troponin, she is not on anticoagulation PTA, CBC wnl  Goal of Therapy:  Heparin level 0.3-0.7 units/ml Monitor platelets by anticoagulation protocol: Yes   Plan:  Heparin 3700 units IV x 1, and gtt at 800 units/hr F/u 6 hour heparin  level  2014, PharmD Clinical Pharmacist ED Pharmacist Phone # [phone] 08/01/2021 2:46 PM

## 2021-08-01 NOTE — H&P (Addendum)
Cardiology History and Physical:   Patient ID: Tiffany Odonnell MRN: [id number]; DOB: 1950/07/24  Admit date: 08/01/2021 Date of Consult: 08/01/2021  PCP:  Beverley Fiedler, [place] [facility] Cardiologist:  Pixie Casino, MD  New   Patient Profile:   Tiffany Odonnell is a 71 y.o. female originally from the [place], with a hx of DM, HLD, HTN, neuropathy, Sjogren's dz, anxiety, who is being seen 08/01/2021 for the evaluation of chest pain at the request of Dr Mariane Masters.  History of Present Illness:   Tiffany Odonnell has never seen cardiology before.  She has a history of chest pain with exertion.  It started 3 months ago when she started walking for exercise.  She would notice this burning tightness in her chest.  It was mild and would resolve with rest.  As she continued walking, the chest pain gradually went away.  Recently, she has not had problems with chest pain with exertion.  Today, she had gotten upset with her great granddaughter.  She then went out for her usual walk.  When she started walking, she got burning chest tightness that reached an 8/10.  This was about 11:45 AM.  She was very upset about this.  She stopped walking came back to the house.  The chest pain improved a little, but did not resolve.  She took her blood sugar which was 119.  She took her usual morning dose of atenolol which she had not had yet.  Her blood pressure was very elevated at 158/110.  She did not take any medication specifically for the pain.  Her daughter was going to go to the store to get her some aspirin, but she had called EMS and they arrived.  EMS gave her 4 baby aspirin, and nitroglycerin.  Her blood pressure on EMS arrival was 210/140.  She has never known to be that high.  The medications relieved her pain, she is currently pain-free.  She has a history of hypo kalemia and at one point was on 3 potassium tablets twice a day.  Now she is only taking 2 tablets once a day.   Her potassium is a little low and will require supplementation.   Past Medical History:  Diagnosis Date   Anxiety    Blood in urine    Cervical disc disorder    bulging disc   Diabetes mellitus without complication (HCC)    Dizziness    Falls    Fibromyalgia    H/O Salmonella gastroenteritis    High cholesterol    History of frequent urinary tract infections    History of hiatal hernia    History of vertigo    Hypertension    Imbalance    Kidney stones    Lichen planopilaris    Lumbar herniated disc    L5-L6   Numbness and tingling    hands and feet bilat    Oscillopsia    Peripheral neuropathy    Septicemia ([place])    2013   Sinusitis    Sjogren's disease ([place])    Tinnitus    Wears glasses     Past Surgical History:  Procedure Laterality Date   ABDOMINAL HYSTERECTOMY     2014   IR URETERAL STENT RIGHT NEW ACCESS W/O SEP NEPHROSTOMY CATH  07/15/2021   KNEE ARTHROSCOPY WITH MEDIAL MENISECTOMY Left 10/29/2019   Procedure: KNEE ARTHROSCOPY WITH MEDIAL AND LATERAL MENISECTOMY WITH DEBRIDEMENT;  Surgeon: Susa Day, MD;  Location: [facility];  Service: Orthopedics;  Laterality: Left;  60 mins   NASAL SINUS SURGERY     NEPHROLITHOTOMY Right 05/31/2015   Procedure: RIGHT PERCUTANEOUS NEPHROLITHOTOMY  ;  Surgeon: Kathie Rhodes, MD;  Location: [facility];  Service: Urology;  Laterality: Right;   NEPHROLITHOTOMY Right 07/15/2021   Procedure: NEPHROLITHOTOMY PERCUTANEOUS;  Surgeon: Janith Lima, MD;  Location: [facility];  Service: Urology;  Laterality: Right;   prolapsed bladder and rectocele surgery      TUBAL LIGATION     1983   URETERAL STENT PLACEMENT     times 2 secondary to kidney stone      Home Medications:  Prior to Admission medications   Medication Sig Start Date End Date Taking? Authorizing Provider  acetaminophen (TYLENOL) 500 MG tablet Take 500 mg by mouth daily as needed for headache (pain).   Yes [provider]  Ascorbic Acid (VITAMIN C) 1000 MG tablet  Take 2,000 mg by mouth daily as needed (immune support).   Yes [provider]  atenolol (TENORMIN) 100 MG tablet Take 100 mg by mouth every morning. 01/30/18  Yes [provider]  betamethasone dipropionate 0.05 % lotion Apply 1 application topically 2 (two) times daily as needed (scalp rash). 04/03/18  Yes [provider]  Carboxymethylcellulose Sodium (THERATEARS OP) Place 1 drop into both eyes 2 (two) times daily as needed (dry eyes).   Yes [provider]  cyclobenzaprine (FLEXERIL) 10 MG tablet Take 2.5-5 mg by mouth 3 (three) times daily as needed for muscle spasms. 1/4 - 1/2 tablet   Yes [provider]  docusate sodium (COLACE) 100 MG capsule Take 1 capsule (100 mg total) by mouth daily as needed for up to 30 doses. Patient taking differently: Take 100 mg by mouth daily as needed (constipation). 07/15/21  Yes Janith Lima, MD  ECHINACEA PO Take 2 capsules by mouth daily as needed (immune system boost).   Yes [provider]  hydrocortisone (ANUSOL-HC) 2.5 % rectal cream Place 1 application rectally 3 (three) times daily as needed for hemorrhoids. 06/13/21  Yes [provider]  indapamide (LOZOL) 2.5 MG tablet Take 2.5 mg by mouth every morning.   Yes [provider]  MAGNESIUM PO Take 1 tablet by mouth at bedtime as needed (relax nervew).   Yes [provider]  Multiple Vitamins-Minerals (ZINC PO) Take 1 tablet by mouth at bedtime as needed (to relax nerves).   Yes [provider]  Potassium Citrate 15 MEQ (1620 MG) TBCR Take 30 mEq by mouth every morning. 10/01/19  Yes [provider]  aspirin EC 81 MG tablet Take 1 tablet (81 mg total) by mouth daily. Patient not taking: Reported on 08/01/2021 10/29/19   Susa Day, MD  oxyCODONE-acetaminophen (PERCOCET) 5-325 MG tablet Take 1 tablet by mouth every 4 (four) hours as needed for up to 18 doses for severe pain. Patient not taking: Reported on  08/01/2021 07/15/21   Janith Lima, MD    Inpatient Medications: Scheduled Meds:  Continuous Infusions:  heparin 800 Units/hr (08/01/21 1526)   PRN Meds:   Allergies:    Allergies  Allergen Reactions   Amoxicillin-Pot Clavulanate Nausea And Vomiting   Ciprofloxacin Hives   Sulfa Antibiotics Other (See Comments)    Trembling, paralyzing pain, red eyes, skin crawling   Latex Itching   Macrolides And Ketolides Diarrhea and Nausea And Vomiting   Tetracyclines & Related Other (See Comments) and Tinitus    Vertigo    Social History:  Social History   Socioeconomic History   Marital status: Divorced    Spouse name: Not on file   Number of children: 2   Years of education: Not on file   Highest education level: 12th grade  Occupational History   Not on file  Tobacco Use   Smoking status: Never   Smokeless tobacco: Never  Vaping Use   Vaping Use: Never used  Substance and Sexual Activity   Alcohol use: No   Drug use: No   Sexual activity: Never  Other Topics Concern   Not on file  Social History Narrative   Lives at home with daughter   Left handed   Caffeine: 1 cup coffee per day some mornings   Social Determinants of Health   Financial Resource Strain: Not on file  Food Insecurity: Not on file  Transportation Needs: Not on file  Physical Activity: Not on file  Stress: Not on file  Social Connections: Not on file  Intimate Partner Violence: Not on file    Family History:   Family History  Problem Relation Age of Onset   CAD Mother    Diabetes Father    Non-Hodgkin's lymphoma Brother    Lupus Other    Hypertension Other    Diabetes Other    Glaucoma Maternal Uncle    Non-Hodgkin's lymphoma Brother    Non-Hodgkin's lymphoma Brother        half brother     ROS:  Please see the history of present illness.  All other ROS reviewed and negative.     Physical Exam/Data:   Vitals:   08/01/21 1430 08/01/21 1500 08/01/21 1545 08/01/21 1600  BP:  122/74 123/89  (!) 151/85  Pulse: (!) 48 (!) 49 (!) 48 (!) 56  Resp: 14 14 13 13   Temp:      TempSrc:      SpO2: 99% 99% 100% 100%  Weight:      Height:       No intake or output data in the 24 hours ending 08/01/21 1647 Last 3 Weights 08/01/2021 07/15/2021 07/15/2021  Weight (lbs) 155 lb 155 lb 155 lb  Weight (kg) 70.308 kg 70.308 kg 70.308 kg     Body mass index is 29.29 kg/m.  General:  Well nourished, well developed, elderly female in no acute distress HEENT: normal for age except for minimal hair due to history of lichen planopilaris Neck: no JVD Vascular: No carotid bruits; Distal pulses 2+ bilaterally Cardiac:  normal S1, S2; RRR; no murmur  Lungs:  clear to auscultation bilaterally, no wheezing, rhonchi or rales  Abd: soft, nontender, no hepatomegaly  Ext: no edema Musculoskeletal:  No deformities, BUE and BLE strength normal and equal Skin: warm and dry  Neuro:  CNs 2-12 intact, no focal abnormalities noted Psych:  Normal affect   EKG:  The EKG was personally reviewed and demonstrates:  sinus bradycardia, HR 52, borderline inferior/lateral ST elevation, similar to 2021 ECG EMS ECGs 1-4 reviewed.  She has high peak T waves and minimal ST elevation that appears slightly better on the later ECGs. Telemetry:  Telemetry was personally reviewed and demonstrates:  sinus brady, SR  Relevant CV Studies: None previously  Laboratory Data:  High Sensitivity Troponin:   Recent Labs  Lab 08/01/21 1321 08/01/21 1522  TROPONINIHS 73* 433*     Chemistry Recent Labs  Lab 08/01/21 1321  NA 134*  K 3.2*  CL 99  CO2 23  GLUCOSE 134*  BUN 22  CREATININE 0.99  CALCIUM 9.1  MG 1.7  GFRNONAA >60  ANIONGAP 12    No results for input(s): PROT, ALBUMIN, AST, ALT, ALKPHOS, BILITOT in the last 168 hours. Lipids No results found for: CHOL, HDL, LDLCALC, LDLDIRECT, TRIG, CHOLHDL   Hematology Recent Labs  Lab 08/01/21 1321  WBC 7.0  RBC 4.00  HGB 12.5  HCT 37.4  MCV 93.5   MCH 31.3  MCHC 33.4  RDW 12.1  PLT 285   Thyroid No results for input(s): TSH, FREET4 in the last 168 hours.  BNPNo results for input(s): BNP, PROBNP in the last 168 hours.  DDimer No results for input(s): DDIMER in the last 168 hours. Lab Results  Component Value Date   HGBA1C 6.0 (H) 06/22/2021     Radiology/Studies:  DG Chest Portable 1 View  Result Date: 08/01/2021 CLINICAL DATA:  Chest pain EXAM: PORTABLE CHEST 1 VIEW COMPARISON:  07/15/2021 FINDINGS: The heart size and mediastinal contours are within normal limits. Both lungs are clear. The visualized skeletal structures are unremarkable. IMPRESSION: No acute abnormality of the lungs in AP portable projection. Electronically Signed   By: Delanna Ahmadi M.D.   On: 08/01/2021 13:32     Assessment and Plan:   NSTEMI -Her cardiac enzymes were initially elevated but have significantly increased with the second test. - Symptoms have been exertional, and were relieved by aspirin and nitro - She is currently pain-free on heparin - Based on her symptoms and her cardiac enzymes as well as an ECG with some abnormalities (although they do not meet STEMI criteria), cardiac catheterization is indicated. - Cardiac catheterization was discussed with the patient fully. The patient understands that risks include but are not limited to stroke (1 in 1000), death (1 in 36), kidney failure [usually temporary] (1 in 500), bleeding (1 in 200), allergic reaction [possibly serious] (1 in 200).  The patient understands and is willing to proceed.   -We will recheck her A1c, check a lipid profile and follow her blood pressure closely  2.  Hypertension: - After the nitroglycerin tablet, her blood pressure was 122/74, but is now 151/85. -Prior to admission she was on indapamide 1.5 mg daily and atenolol milligrams daily - Recommend changing the atenolol to carvedilol for less heart rate effect and more blood pressure affect. -With the problems she has  had with hypokalemia, discuss with MD discontinuing the indapamide and changing to amlodipine or other medication  3.  Hyperlipidemia: - It is listed as a diagnosis, but she is not currently taking anything for it - Check profile and start high-dose statin  4.  Diabetes: - She increased her exercise and changed her diet when she was diagnosed with diabetes a few months ago. - Check an A1c and follow sugars while she is here  5.  Hypokalemia and hypomagnesemia - Supplement and follow  Otherwise, continue home medications  Risk Assessment/Risk Scores:     TIMI Risk Score for Unstable Angina or Non-ST Elevation MI:   The patient's TIMI risk score is 4, which indicates a 20% risk of all cause mortality, new or recurrent myocardial infarction or need for urgent revascularization in the next 14 days.   For questions or updates, please contact [place] Please consult [email or web address] for contact info under    Signed, Rosaria Ferries, PA-C  08/01/2021 4:47 PM

## 2021-08-01 NOTE — ED Triage Notes (Signed)
Per [facility] pt coming from home where she had a sudden onset of central chest pressure and heaviness on chest along with a burning sensation. Pain non radiating. On fire arrival pt was clenching chest and appeared in distress with BP 210/140. Initial EMS BP 200/104. Gave 324 aspirin and 1 nitro that relieved pain. Patient alert and orientated and answering all questions appropriately.

## 2021-08-01 NOTE — ED Provider Notes (Signed)
[facility] EMERGENCY DEPARTMENT Provider Note   CSN: [id number] Arrival date & time: 08/01/21  1247     History  Chief Complaint  Patient presents with   Chest Pain    Tiffany Odonnell is a 71 y.o. female.   Chest Pain  Patient is 70 year old female with a past medical history significant for HTN, Sjogren's, peripheral neuropathy, cholesterol, fibromyalgia, anxiety, DM2  Patient is presented emergency room today with chest pain that came on gradually seem to be sternal radiating to her left shoulder associated with some "cold sweats"/diaphoresis, mild shortness of breath.  She denies any nausea or vomiting.  She states she was given nitroglycerin and aspirin and her chest pain improved.  No cough congestion fevers No abdominal pain    Home Medications Prior to Admission medications   Medication Sig Start Date End Date Taking? Authorizing Provider  acetaminophen (TYLENOL) 500 MG tablet Take 500 mg by mouth daily as needed for headache (pain).   Yes [provider]  Ascorbic Acid (VITAMIN C) 1000 MG tablet Take 2,000 mg by mouth daily as needed (immune support).   Yes [provider]  atenolol (TENORMIN) 100 MG tablet Take 100 mg by mouth every morning. 01/30/18  Yes [provider]  betamethasone dipropionate 0.05 % lotion Apply 1 application topically 2 (two) times daily as needed (scalp rash). 04/03/18  Yes [provider]  Carboxymethylcellulose Sodium (THERATEARS OP) Place 1 drop into both eyes 2 (two) times daily as needed (dry eyes).   Yes [provider]  cyclobenzaprine (FLEXERIL) 10 MG tablet Take 2.5-5 mg by mouth 3 (three) times daily as needed for muscle spasms. 1/4 - 1/2 tablet   Yes [provider]  docusate sodium (COLACE) 100 MG capsule Take 1 capsule (100 mg total) by mouth daily as needed for up to 30 doses. Patient taking differently: Take 100 mg by mouth daily as needed (constipation). 07/15/21   Yes Janith Lima, MD  ECHINACEA PO Take 2 capsules by mouth daily as needed (immune system boost).   Yes [provider]  hydrocortisone (ANUSOL-HC) 2.5 % rectal cream Place 1 application rectally 3 (three) times daily as needed for hemorrhoids. 06/13/21  Yes [provider]  indapamide (LOZOL) 2.5 MG tablet Take 2.5 mg by mouth every morning.   Yes [provider]  MAGNESIUM PO Take 1 tablet by mouth at bedtime as needed (relax nervew).   Yes [provider]  Multiple Vitamins-Minerals (ZINC PO) Take 1 tablet by mouth at bedtime as needed (to relax nerves).   Yes [provider]  Potassium Citrate 15 MEQ (1620 MG) TBCR Take 30 mEq by mouth every morning. 10/01/19  Yes [provider]  aspirin EC 81 MG tablet Take 1 tablet (81 mg total) by mouth daily. Patient not taking: Reported on 08/01/2021 10/29/19   Susa Day, MD  oxyCODONE-acetaminophen (PERCOCET) 5-325 MG tablet Take 1 tablet by mouth every 4 (four) hours as needed for up to 18 doses for severe pain. Patient not taking: Reported on 08/01/2021 07/15/21   Janith Lima, MD      Allergies    Amoxicillin-pot clavulanate, Ciprofloxacin, Sulfa antibiotics, Latex, Macrolides and ketolides, and Tetracyclines & related    Review of Systems   Review of Systems  Cardiovascular:  Positive for chest pain.   Physical Exam Updated Vital Signs BP 123/89    Pulse (!) 49    Temp 97.9 F (36.6 C) (Oral)    Resp 14  Ht 5\' 1"  (1.549 m)    Wt 70.3 kg    SpO2 99%    BMI 29.29 kg/m  Physical Exam Vitals and nursing note reviewed.  Constitutional:      General: She is not in acute distress. HENT:     Head: Normocephalic and atraumatic.     Nose: Nose normal.  Eyes:     General: No scleral icterus. Cardiovascular:     Rate and Rhythm: Normal rate and regular rhythm.     Pulses: Normal pulses.     Heart sounds: Normal heart sounds.     Comments: BL radial art pulses 3+ symmetric   Pulmonary:     Effort: Pulmonary effort is normal. No respiratory distress.     Breath sounds: No wheezing.  Abdominal:     Palpations: Abdomen is soft.     Tenderness: There is no abdominal tenderness.  Musculoskeletal:     Cervical back: Normal range of motion.     Right lower leg: No edema.     Left lower leg: No edema.  Skin:    General: Skin is warm and dry.     Capillary Refill: Capillary refill takes less than 2 seconds.  Neurological:     Mental Status: She is alert. Mental status is at baseline.  Psychiatric:        Mood and Affect: Mood normal.        Behavior: Behavior normal.    ED Results / Procedures / Treatments   Labs (all labs ordered are listed, but only abnormal results are displayed) Labs Reviewed  BASIC METABOLIC PANEL - Abnormal; Notable for the following components:      Result Value   Sodium 134 (*)    Potassium 3.2 (*)    Glucose, Bld 134 (*)    All other components within normal limits  TROPONIN I (HIGH SENSITIVITY) - Abnormal; Notable for the following components:   Troponin I (High Sensitivity) 73 (*)    All other components within normal limits  RESP PANEL BY RT-PCR (FLU A&B, COVID) ARPGX2  CBC  MAGNESIUM  HEPARIN LEVEL (UNFRACTIONATED)  TROPONIN I (HIGH SENSITIVITY)    EKG EKG Interpretation  Date/Time:  Monday August 01 2021 14:00:45 EST Ventricular Rate:  51 PR Interval:  191 QRS Duration: 102 QT Interval:  445 QTC Calculation: 410 R Axis:   62 Text Interpretation: Sinus rhythm Borderline ST elevation, inferior leads No significant change since last tracing Confirmed by Varney Biles [phone]) on 08/01/2021 2:38:50 PM  Radiology DG Chest Portable 1 View  Result Date: 08/01/2021 CLINICAL DATA:  Chest pain EXAM: PORTABLE CHEST 1 VIEW COMPARISON:  07/15/2021 FINDINGS: The heart size and mediastinal contours are within normal limits. Both lungs are clear. The visualized skeletal structures are unremarkable. IMPRESSION: No acute  abnormality of the lungs in AP portable projection. Electronically Signed   By: Delanna Ahmadi M.D.   On: 08/01/2021 13:32    Procedures .Critical Care Performed by: Tedd Sias, PA Authorized by: Tedd Sias, PA   Critical care provider statement:    Critical care time (minutes):  35   Critical care time was exclusive of:  Separately billable procedures and treating other patients and teaching time   Critical care was necessary to treat or prevent imminent or life-threatening deterioration of the following conditions: NSTEMI.   Critical care was time spent personally by me on the following activities:  Development of treatment plan with patient or surrogate, review of old charts, re-evaluation  of patient's condition, pulse oximetry, ordering and review of radiographic studies, ordering and review of laboratory studies, ordering and performing treatments and interventions, obtaining history from patient or surrogate, examination of patient and evaluation of patient's response to treatment   Care discussed with: admitting provider      Medications Ordered in ED Medications  heparin ADULT infusion 100 units/mL (25000 units/282mL) (800 Units/hr Intravenous New Bag/Given 08/01/21 1526)  heparin bolus via infusion 3,700 Units (3,700 Units Intravenous Bolus from Bag 08/01/21 1526)    ED Course/ Medical Decision Making/ A&P                           Medical Decision Making Amount and/or Complexity of Data Reviewed Labs: ordered. Radiology: ordered.  Risk Prescription drug management. Decision regarding hospitalization.   This patient presents to the ED for concern of chest pain, this involves a number of treatment options, and is a complaint that carries with it a high risk of complications and morbidity.  The differential diagnosis includes The emergent causes of chest pain include: Acute coronary syndrome, tamponade, pericarditis/myocarditis, aortic dissection, pulmonary embolism,  tension pneumothorax, pneumonia, and esophageal rupture.   Co morbidities: Discussed in HPI   Brief History:  Patient with chest pain that started 11 AM this morning some concerning sounding symptoms and associated symptoms.  Has not had chest pain work-up or cardiac catheterization done in the past  EMR reviewed including pt PMHx, past surgical history and past visits to ER.   See HPI for more details   Lab Tests:  I ordered and independently interpreted labs.  The pertinent results include:    Labs notable for troponin of 73 CBC without anemia or leukocytosis.  BMP with mild hypokalemia.  Magnesium within normal limits.  Second troponin pending   Imaging Studies:  NAD. I personally reviewed all imaging studies and no acute abnormality found. I agree with radiology interpretation.    Cardiac Monitoring:  The patient was maintained on a cardiac monitor.  I personally viewed and interpreted the cardiac monitored which showed an underlying rhythm of: NSR occasional PVC EKG non-ischemic no evidence of acute ischemia no ST elevations or depressions of note.   Medicines ordered:  I ordered medication including heparin for NSTEMI Reevaluation of the patient after these medicines showed that the patient improved I have reviewed the patients home medicines and have made adjustments as needed   Critical Interventions:  Heparin, cardiology consultation   Consults:  I requested consultation with cardiologist--discussed with Trish who will have cardiologist come evaluate patient.    Reevaluation:  After the interventions noted above I re-evaluated patient and found that they have :improved   Social Determinants of Health:  The patient's social determinants of health were a factor in the care of this patient    Problem List / ED Course:  NSTEMI, chest pain   Dispostion:  After consideration of the diagnostic results and the patients response to treatment, I  feel that the patent would benefit from admission.  Cardiology to evaluate and determine primary team.  Patient agreeable to plan.    Patient care transferred to oncoming team to follow-up on cardiology recommendations.  Final Clinical Impression(s) / ED Diagnoses Final diagnoses:  NSTEMI (non-ST elevated myocardial infarction) [facility])    Rx / DC Orders ED Discharge Orders     None         Tedd Sias, [place] 08/01/21 1535    Nanavati, Ankit,  MD 08/02/21 1641

## 2021-08-02 ENCOUNTER — Inpatient Hospital Stay (HOSPITAL_COMMUNITY): Payer: Medicare Other

## 2021-08-02 ENCOUNTER — Encounter (HOSPITAL_COMMUNITY)
Admission: EM | Disposition: A | Discharge status: Home / Self Care | Payer: Self-pay | Source: Home / Self Care | Attending: Surgery

## 2021-08-02 DIAGNOSIS — I214 Non-ST elevation (NSTEMI) myocardial infarction: Secondary | ICD-10-CM | POA: Diagnosis not present

## 2021-08-02 DIAGNOSIS — I2510 Atherosclerotic heart disease of native coronary artery without angina pectoris: Secondary | ICD-10-CM

## 2021-08-02 HISTORY — PX: LEFT HEART CATH AND CORONARY ANGIOGRAPHY: CATH118249

## 2021-08-02 LAB — COMPREHENSIVE METABOLIC PANEL
ALT: 21 U/L (ref 0–44)
AST: 32 U/L (ref 15–41)
Albumin: 3.6 g/dL (ref 3.5–5.0)
Alkaline Phosphatase: 62 U/L (ref 38–126)
Anion gap: 9 (ref 5–15)
BUN: 17 mg/dL (ref 8–23)
CO2: 26 mmol/L (ref 22–32)
Calcium: 9.5 mg/dL (ref 8.9–10.3)
Chloride: 103 mmol/L (ref 98–111)
Creatinine, Ser: 0.87 mg/dL (ref 0.44–1.00)
GFR, Estimated: >60 mL/min (ref >60)
Glucose, Bld: 100 mg/dL — ABNORMAL HIGH (ref 70–99)
Potassium: 3.9 mmol/L (ref 3.5–5.1)
Sodium: 138 mmol/L (ref 135–145)
Total Bilirubin: 0.9 mg/dL (ref 0.3–1.2)
Total Protein: 7.7 g/dL (ref 6.5–8.1)

## 2021-08-02 LAB — ECHOCARDIOGRAM COMPLETE
Area-P 1/2: 3.68 cm2
Calc EF: 62.5 %
Height: 61 in
S' Lateral: 3.1 cm
Single Plane A2C EF: 62.4 %
Single Plane A4C EF: 62 %
Weight: 2480 oz

## 2021-08-02 LAB — GLUCOSE, CAPILLARY
Glucose-Capillary: 95 mg/dL (ref 70–99)
Glucose-Capillary: 169 mg/dL — ABNORMAL HIGH (ref 70–99)

## 2021-08-02 LAB — CBG MONITORING, ED
Glucose-Capillary: 103 mg/dL — ABNORMAL HIGH (ref 70–99)
Glucose-Capillary: 84 mg/dL (ref 70–99)

## 2021-08-02 LAB — HIV ANTIBODY (ROUTINE TESTING W REFLEX): HIV Screen 4th Generation wRfx: NONREACTIVE

## 2021-08-02 LAB — HEPARIN LEVEL (UNFRACTIONATED): Heparin Unfractionated: 0.48 IU/mL (ref 0.30–0.70)

## 2021-08-02 LAB — CBC
HCT: 40.6 % (ref 36.0–46.0)
Hemoglobin: 13.7 g/dL (ref 12.0–15.0)
MCH: 31.6 pg (ref 26.0–34.0)
MCHC: 33.7 g/dL (ref 30.0–36.0)
MCV: 93.5 fL (ref 80.0–100.0)
Platelets: 300 10*3/uL (ref 150–400)
RBC: 4.34 MIL/uL (ref 3.87–5.11)
RDW: 12.2 % (ref 11.5–15.5)
WBC: 8 10*3/uL (ref 4.0–10.5)
nRBC: 0 % (ref 0.0–0.2)

## 2021-08-02 LAB — TROPONIN I (HIGH SENSITIVITY): Troponin I (High Sensitivity): 1543 ng/L (ref <18)

## 2021-08-02 LAB — LIPID PANEL
Cholesterol: 222 mg/dL — ABNORMAL HIGH (ref 0–200)
HDL: 56 mg/dL (ref >40)
LDL Cholesterol: 146 mg/dL — ABNORMAL HIGH (ref 0–99)
Total CHOL/HDL Ratio: 4 RATIO
Triglycerides: 98 mg/dL (ref <150)
VLDL: 20 mg/dL (ref 0–40)

## 2021-08-02 LAB — TSH: TSH: 0.871 u[IU]/mL (ref 0.350–4.500)

## 2021-08-02 SURGERY — LEFT HEART CATH AND CORONARY ANGIOGRAPHY
Anesthesia: LOCAL

## 2021-08-02 MED ORDER — SODIUM CHLORIDE 0.9 % IV SOLN: 250.0000 mL | INTRAVENOUS | Status: DC | PRN

## 2021-08-02 MED ORDER — SODIUM CHLORIDE 0.9% FLUSH: 3.0000 mL | Freq: Two times a day (BID) | INTRAVENOUS | Status: DC

## 2021-08-02 MED ORDER — CARVEDILOL 6.25 MG PO TABS
6.2500 mg | ORAL_TABLET | Freq: Two times a day (BID) | ORAL | Status: DC
  Administered 2021-08-02 – 2021-08-04 (×5): 6.25 mg via ORAL
  Filled 2021-08-02: qty 2
  Filled 2021-08-02 (×5): qty 1

## 2021-08-02 MED ORDER — HEPARIN (PORCINE) 25000 UT/250ML-% IV SOLN
1050.0000 [IU]/h | INTRAVENOUS | Status: DC
  Administered 2021-08-03: 800 [IU]/h via INTRAVENOUS
  Administered 2021-08-04: 1050 [IU]/h via INTRAVENOUS
  Filled 2021-08-02 (×4): qty 250

## 2021-08-02 MED ORDER — VERAPAMIL HCL 2.5 MG/ML IV SOLN
INTRAVENOUS | Status: DC | PRN
  Administered 2021-08-02: 10 mL via INTRA_ARTERIAL

## 2021-08-02 MED ORDER — ASPIRIN 81 MG PO CHEW
81.0000 mg | CHEWABLE_TABLET | ORAL | Status: AC
  Administered 2021-08-02: 81 mg via ORAL
  Filled 2021-08-02: qty 1

## 2021-08-02 MED ORDER — SODIUM CHLORIDE 0.9 % IV SOLN: INTRAVENOUS | Status: DC

## 2021-08-02 MED ORDER — ONDANSETRON HCL 4 MG/2ML IJ SOLN: 4.0000 mg | Freq: Four times a day (QID) | INTRAMUSCULAR | Status: DC | PRN

## 2021-08-02 MED ORDER — HEPARIN SODIUM (PORCINE) 1000 UNIT/ML IJ SOLN
INTRAMUSCULAR | Status: DC | PRN
  Administered 2021-08-02: 3500 [IU] via INTRAVENOUS

## 2021-08-02 MED ORDER — FENTANYL CITRATE (PF) 100 MCG/2ML IJ SOLN
INTRAMUSCULAR | Status: AC
  Filled 2021-08-02: qty 2

## 2021-08-02 MED ORDER — IOHEXOL 350 MG/ML SOLN
INTRAVENOUS | Status: DC | PRN
  Administered 2021-08-02: 45 mL

## 2021-08-02 MED ORDER — LIDOCAINE HCL (PF) 1 % IJ SOLN
INTRAMUSCULAR | Status: DC | PRN
  Administered 2021-08-02: 2 mL

## 2021-08-02 MED ORDER — HEPARIN (PORCINE) IN NACL 1000-0.9 UT/500ML-% IV SOLN
INTRAVENOUS | Status: AC
  Filled 2021-08-02: qty 500

## 2021-08-02 MED ORDER — HEPARIN (PORCINE) IN NACL 1000-0.9 UT/500ML-% IV SOLN
INTRAVENOUS | Status: DC | PRN
  Administered 2021-08-02 (×2): 500 mL

## 2021-08-02 MED ORDER — ATORVASTATIN CALCIUM 80 MG PO TABS
80.0000 mg | ORAL_TABLET | Freq: Every day | ORAL | Status: DC
  Administered 2021-08-04: 80 mg via ORAL
  Filled 2021-08-02 (×2): qty 1

## 2021-08-02 MED ORDER — MIDAZOLAM HCL 2 MG/2ML IJ SOLN
INTRAMUSCULAR | Status: DC | PRN
  Administered 2021-08-02: 2 mg via INTRAVENOUS

## 2021-08-02 MED ORDER — SODIUM CHLORIDE 0.9 % IV SOLN: INTRAVENOUS | Status: AC

## 2021-08-02 MED ORDER — LIDOCAINE HCL (PF) 1 % IJ SOLN
INTRAMUSCULAR | Status: AC
  Filled 2021-08-02: qty 30

## 2021-08-02 MED ORDER — VERAPAMIL HCL 2.5 MG/ML IV SOLN
INTRAVENOUS | Status: AC
  Filled 2021-08-02: qty 2

## 2021-08-02 MED ORDER — ACETAMINOPHEN 325 MG PO TABS: 650.0000 mg | ORAL_TABLET | ORAL | Status: DC | PRN

## 2021-08-02 MED ORDER — ACETAMINOPHEN 325 MG PO TABS
ORAL_TABLET | ORAL | Status: AC
  Filled 2021-08-02: qty 2

## 2021-08-02 MED ORDER — FENTANYL CITRATE (PF) 100 MCG/2ML IJ SOLN
INTRAMUSCULAR | Status: DC | PRN
  Administered 2021-08-02: 25 ug via INTRAVENOUS

## 2021-08-02 MED ORDER — HYDRALAZINE HCL 20 MG/ML IJ SOLN: 10.0000 mg | INTRAMUSCULAR | Status: DC | PRN

## 2021-08-02 MED ORDER — SODIUM CHLORIDE 0.9% FLUSH: 3.0000 mL | INTRAVENOUS | Status: DC | PRN

## 2021-08-02 MED ORDER — MIDAZOLAM HCL 2 MG/2ML IJ SOLN
INTRAMUSCULAR | Status: AC
  Filled 2021-08-02: qty 2

## 2021-08-02 MED ORDER — HEPARIN SODIUM (PORCINE) 1000 UNIT/ML IJ SOLN
INTRAMUSCULAR | Status: AC
  Filled 2021-08-02: qty 10

## 2021-08-02 MED ORDER — LABETALOL HCL 5 MG/ML IV SOLN: 10.0000 mg | INTRAVENOUS | Status: DC | PRN

## 2021-08-02 SURGICAL SUPPLY — 10 items
CATH 5FR JL3.5 JR4 ANG PIG MP (CATHETERS) ×1 IMPLANT
DEVICE RAD COMP TR BAND LRG (VASCULAR PRODUCTS) ×1 IMPLANT
GLIDESHEATH SLEND SS 6F .021 (SHEATH) ×1 IMPLANT
GUIDEWIRE INQWIRE 1.5J.035X260 (WIRE) IMPLANT
INQWIRE 1.5J .035X260CM (WIRE) ×2
KIT HEART LEFT (KITS) ×2 IMPLANT
PACK CARDIAC CATHETERIZATION (CUSTOM PROCEDURE TRAY) ×2 IMPLANT
SHEATH PROBE COVER 6X72 (BAG) ×1 IMPLANT
TRANSDUCER W/STOPCOCK (MISCELLANEOUS) ×2 IMPLANT
TUBING CIL FLEX 10 FLL-RA (TUBING) ×2 IMPLANT

## 2021-08-02 NOTE — H&P (View-Only) (Signed)
° °DAILY PROGRESS NOTE  ° °Patient Name: Tiffany Odonnell °Date of Encounter: 08/02/2021 °Cardiologist: Braeson Rupe C Elyssa Pendelton, MD ° °Chief Complaint  ° °No chest pain ° °Patient Profile  ° °Tiffany Odonnell is a 70 y.o. female originally from [place], with a hx of DM, HLD, HTN, neuropathy, Sjogren's dz, anxiety, who is being seen 08/01/2021 for the evaluation of chest pain at the request of Dr Nanavanti. ° °Subjective  ° °No further chest pain overnight.  BP well-controlled. Noted to be bradycardic. Switched from atenolol to carvedilol - will decrease to 6.25 mg BID. Troponin rose further (73, 433, 1543) - consistent with NSTEMI. Plan for LHC today. TC 222, TG 98, HDL 56, LDL 146 - will increase atorvastatin to 80 mg  QHS. ° °Objective  ° °Vitals:  ° 08/02/21 0400 08/02/21 0500 08/02/21 0600 08/02/21 0700  °BP: 110/73 109/75 103/63 112/68  °Pulse: (!) 51 (!) 53 (!) 48 (!) 56  °Resp: 13 15 14 12  °Temp:      °TempSrc:      °SpO2: 98% 98% 97% 98%  °Weight:      °Height:      ° °No intake or output data in the 24 hours ending 08/02/21 0858 °Filed Weights  ° 08/01/21 1256  °Weight: 70.3 kg  ° ° °Physical Exam  ° °General appearance: alert and no distress °Lungs: clear to auscultation bilaterally °Heart: regular rate and rhythm, S1, S2 normal, no murmur, click, rub or gallop °Extremities: extremities normal, atraumatic, no cyanosis or edema °Neurologic: Grossly normal ° °Inpatient Medications  °  °Scheduled Meds: ° aspirin EC  81 mg Oral Daily  ° atorvastatin  40 mg Oral Daily  ° carvedilol  12.5 mg Oral BID WC  ° insulin aspart  0-15 Units Subcutaneous TID WC  ° insulin aspart  0-5 Units Subcutaneous QHS  ° magnesium oxide  400 mg Oral Daily  ° potassium chloride  20 mEq Oral Daily  ° sodium chloride flush  3 mL Intravenous Q12H  ° ° °Continuous Infusions: ° sodium chloride    ° sodium chloride 100 mL/hr at 08/02/21 0354  ° heparin 800 Units/hr (08/01/21 1526)  ° ° °PRN Meds: °sodium chloride, acetaminophen,  ALPRAZolam, cyclobenzaprine, docusate sodium, nitroGLYCERIN, ondansetron (ZOFRAN) IV, sodium chloride flush, zolpidem  ° °Labs  ° °Results for orders placed or performed during the hospital encounter of 08/01/21 (from the past 48 hour(s))  °Basic metabolic panel     Status: Abnormal  ° Collection Time: 08/01/21  1:21 PM  °Result Value Ref Range  ° Sodium 134 (L) 135 - 145 mmol/L  ° Potassium 3.2 (L) 3.5 - 5.1 mmol/L  ° Chloride 99 98 - 111 mmol/L  ° CO2 23 22 - 32 mmol/L  ° Glucose, Bld 134 (H) 70 - 99 mg/dL  °  Comment: Glucose reference range applies only to samples taken after fasting for at least 8 hours.  ° BUN 22 8 - 23 mg/dL  ° Creatinine, Ser 0.99 0.44 - 1.00 mg/dL  ° Calcium 9.1 8.9 - 10.3 mg/dL  ° GFR, Estimated >60 >60 mL/min  °  Comment: (NOTE) °Calculated using the CKD-EPI Creatinine Equation (2021) °  ° Anion gap 12 5 - 15  °  Comment: Performed at [facility], [address] , [place] [address]  °CBC     Status: None  ° Collection Time: 08/01/21  1:21 PM  °Result Value Ref Range  ° WBC 7.0 4.0 - 10.5 K/uL  °   RBC 4.00 3.87 - 5.11 MIL/uL  ° Hemoglobin 12.5 12.0 - 15.0 g/dL  ° HCT 37.4 36.0 - 46.0 %  ° MCV 93.5 80.0 - 100.0 fL  ° MCH 31.3 26.0 - 34.0 pg  ° MCHC 33.4 30.0 - 36.0 g/dL  ° RDW 12.1 11.5 - 15.5 %  ° Platelets 285 150 - 400 K/uL  ° nRBC 0.0 0.0 - 0.2 %  °  Comment: Performed at [facility], [address] [place], [place] [address]  °Troponin I (High Sensitivity)     Status: Abnormal  ° Collection Time: 08/01/21  1:21 PM  °Result Value Ref Range  ° Troponin I (High Sensitivity) 73 (H) <18 ng/L  °  Comment: (NOTE) °Elevated high sensitivity troponin I (hsTnI) values and significant  °changes across serial measurements may suggest ACS but many other  °chronic and acute conditions are known to elevate hsTnI results.  °Refer to the "Links" section for chest pain algorithms and additional  °guidance. °Performed at [facility], [address] [place],  [place] °[address] °  °Magnesium     Status: None  ° Collection Time: 08/01/21  1:21 PM  °Result Value Ref Range  ° Magnesium 1.7 1.7 - 2.4 mg/dL  °  Comment: Performed at [facility], [address] , [place] [address]  °Resp Panel by RT-PCR (Flu A&B, Covid) Nasopharyngeal Swab     Status: None  ° Collection Time: 08/01/21  2:40 PM  ° Specimen: Nasopharyngeal Swab; Nasopharyngeal(NP) swabs in vial transport medium  °Result Value Ref Range  ° SARS Coronavirus 2 by RT PCR NEGATIVE NEGATIVE  °  Comment: (NOTE) °SARS-CoV-2 target nucleic acids are NOT DETECTED. ° °The SARS-CoV-2 RNA is generally detectable in upper respiratory °specimens during the acute phase of infection. The lowest °concentration of SARS-CoV-2 viral copies this assay can detect is °138 copies/mL. A negative result does not preclude SARS-Cov-2 °infection and should not be used as the sole basis for treatment or °other patient management decisions. A negative result may occur with  °improper specimen collection/handling, submission of specimen other °than nasopharyngeal swab, presence of viral mutation(s) within the °areas targeted by this assay, and inadequate number of viral °copies(<138 copies/mL). A negative result must be combined with °clinical observations, patient history, and epidemiological °information. The expected result is Negative. ° °Fact Sheet for Patients:  °https://www.fda.gov/media/152166/download ° °Fact Sheet for Healthcare Providers:  °https://www.fda.gov/media/152162/download ° °This test is no t yet approved or cleared by the [place] FDA and  °has been authorized for detection and/or diagnosis of SARS-CoV-2 by °FDA under an Emergency Use Authorization (EUA). This EUA will remain  °in effect (meaning this test can be used) for the duration of the °COVID-19 declaration under Section 564(b)(1) of the Act, 21 °U.S.C.section 360bbb-3(b)(1), unless the authorization is terminated  °or revoked sooner.  ° ° °  ° Influenza  A by PCR NEGATIVE NEGATIVE  ° Influenza B by PCR NEGATIVE NEGATIVE  °  Comment: (NOTE) °The Xpert Xpress SARS-CoV-2/FLU/RSV plus assay is intended as an aid °in the diagnosis of influenza from Nasopharyngeal swab specimens and °should not be used as a sole basis for treatment. Nasal washings and °aspirates are unacceptable for Xpert Xpress SARS-CoV-2/FLU/RSV °testing. ° °Fact Sheet for Patients: °https://www.fda.gov/media/152166/download ° °Fact Sheet for Healthcare Providers: °https://www.fda.gov/media/152162/download ° °This test is not yet approved or cleared by the [place] FDA and °has been authorized for detection and/or diagnosis of SARS-CoV-2 by °FDA under an Emergency   Use Authorization (EUA). This EUA will remain °in effect (meaning this test can be used) for the duration of the °COVID-19 declaration under Section 564(b)(1) of the Act, 21 U.S.C. °section 360bbb-3(b)(1), unless the authorization is terminated or °revoked. ° °Performed at [facility], [address] [place], [place] °[address] °  °Troponin I (High Sensitivity)     Status: Abnormal  ° Collection Time: 08/01/21  3:22 PM  °Result Value Ref Range  ° Troponin I (High Sensitivity) 433 (HH) <18 ng/L  °  Comment: CRITICAL RESULT CALLED TO, READ BACK BY AND VERIFIED WITH: °A BANK,RN 1625 08/01/2021 WBOND °(NOTE) °Elevated high sensitivity troponin I (hsTnI) values and significant  °changes across serial measurements may suggest ACS but many other  °chronic and acute conditions are known to elevate hsTnI results.  °Refer to the Links section for chest pain algorithms and additional  °guidance. °Performed at [facility], [address] [place], [place] °[address] °  °HIV Antibody (routine testing w rflx)     Status: None  ° Collection Time: 08/01/21 10:37 PM  °Result Value Ref Range  ° HIV Screen 4th Generation wRfx Non Reactive Non Reactive  °  Comment: Performed at [facility], [address] [place], [place] [address]   °TSH     Status: None  ° Collection Time: 08/01/21 10:37 PM  °Result Value Ref Range  ° TSH 0.871 0.350 - 4.500 uIU/mL  °  Comment: Performed by a 3rd Generation assay with a functional sensitivity of <=0.01 uIU/mL. °Performed at [facility], [address] [place], [place] [address] °  °Troponin I (High Sensitivity)     Status: Abnormal  ° Collection Time: 08/01/21 10:37 PM  °Result Value Ref Range  ° Troponin I (High Sensitivity) 1,543 (HH) <18 ng/L  °  Comment: CRITICAL VALUE NOTED.  VALUE IS CONSISTENT WITH PREVIOUSLY REPORTED AND CALLED VALUE. °(NOTE) °Elevated high sensitivity troponin I (hsTnI) values and significant  °changes across serial measurements may suggest ACS but many other  °chronic and acute conditions are known to elevate hsTnI results.  °Refer to the Links section for chest pain algorithms and additional  °guidance. °Performed at [facility], [address] [place], [place] °[address] °  °Hemoglobin A1c     Status: Abnormal  ° Collection Time: 08/01/21 10:37 PM  °Result Value Ref Range  ° Hgb A1c MFr Bld 5.9 (H) 4.8 - 5.6 %  °  Comment: (NOTE) °Pre diabetes:          5.7%-6.4% ° °Diabetes:              >6.4% ° °Glycemic control for   <7.0% °adults with diabetes °  ° Mean Plasma Glucose 122.63 mg/dL  °  Comment: Performed at [facility], [address] [place], [place] [address]  °Heparin level (unfractionated)     Status: None  ° Collection Time: 08/01/21 11:48 PM  °Result Value Ref Range  ° Heparin Unfractionated 0.42 0.30 - 0.70 IU/mL  °  Comment: (NOTE) °The clinical reportable range upper limit is being lowered to >1.10 °to align with the FDA approved guidance for the current laboratory °assay. ° °If heparin results are below expected values, and patient dosage has  °been confirmed, suggest follow up testing of antithrombin III levels. °Performed at  Hospital Lab, [address] [place], [place] °[address] °  °Heparin level (unfractionated)     Status: None  °    Collection Time: 08/02/21  4:00 AM  °Result Value Ref Range  ° Heparin Unfractionated 0.48 0.30 - 0.70 IU/mL  °  Comment: (NOTE) °The clinical reportable range upper limit is being lowered to >1.10 °to align with the FDA approved guidance for the current laboratory °assay. ° °If heparin results are below expected values, and patient dosage has  °been confirmed, suggest follow up testing of antithrombin III levels. °Performed at [facility], [address] [place], [place] °[address] °  °CBC     Status: None  ° Collection Time: 08/02/21  4:00 AM  °Result Value Ref Range  ° WBC 8.0 4.0 - 10.5 K/uL  ° RBC 4.34 3.87 - 5.11 MIL/uL  ° Hemoglobin 13.7 12.0 - 15.0 g/dL  ° HCT 40.6 36.0 - 46.0 %  ° MCV 93.5 80.0 - 100.0 fL  ° MCH 31.6 26.0 - 34.0 pg  ° MCHC 33.7 30.0 - 36.0 g/dL  ° RDW 12.2 11.5 - 15.5 %  ° Platelets 300 150 - 400 K/uL  ° nRBC 0.0 0.0 - 0.2 %  °  Comment: Performed at [facility], [address] [place], [place] [address]  °Comprehensive metabolic panel     Status: Abnormal  ° Collection Time: 08/02/21  4:00 AM  °Result Value Ref Range  ° Sodium 138 135 - 145 mmol/L  ° Potassium 3.9 3.5 - 5.1 mmol/L  ° Chloride 103 98 - 111 mmol/L  ° CO2 26 22 - 32 mmol/L  ° Glucose, Bld 100 (H) 70 - 99 mg/dL  °  Comment: Glucose reference range applies only to samples taken after fasting for at least 8 hours.  ° BUN 17 8 - 23 mg/dL  ° Creatinine, Ser 0.87 0.44 - 1.00 mg/dL  ° Calcium 9.5 8.9 - 10.3 mg/dL  ° Total Protein 7.7 6.5 - 8.1 g/dL  ° Albumin 3.6 3.5 - 5.0 g/dL  ° AST 32 15 - 41 U/L  ° ALT 21 0 - 44 U/L  ° Alkaline Phosphatase 62 38 - 126 U/L  ° Total Bilirubin 0.9 0.3 - 1.2 mg/dL  ° GFR, Estimated >60 >60 mL/min  °  Comment: (NOTE) °Calculated using the CKD-EPI Creatinine Equation (2021) °  ° Anion gap 9 5 - 15  °  Comment: Performed at [facility], [address] [place], [place] [address]  °Lipid panel     Status: Abnormal  ° Collection Time: 08/02/21  4:00 AM  °Result Value Ref Range  °  Cholesterol 222 (H) 0 - 200 mg/dL  ° Triglycerides 98 <150 mg/dL  ° HDL 56 >40 mg/dL  ° Total CHOL/HDL Ratio 4.0 RATIO  ° VLDL 20 0 - 40 mg/dL  ° LDL Cholesterol 146 (H) 0 - 99 mg/dL  °  Comment:        °Total Cholesterol/HDL:CHD Risk °Coronary Heart Disease Risk Table °                    Men   Women ° 1/2 Average Risk   3.4   3.3 ° Average Risk       5.0   4.4 ° 2 X Average Risk   9.6   7.1 ° 3 X Average Risk  23.4   11.0 °       °Use the calculated Patient Ratio °above and the CHD Risk Table °to determine the patient's CHD Risk. °       °ATP III CLASSIFICATION (LDL): ° <  100     mg/dL   Optimal ° 100-129  mg/dL   Near or Above °                   Optimal ° 130-159  mg/dL   Borderline ° 160-189  mg/dL   High ° >190     mg/dL   Very High °Performed at [facility], [address] [place],  [address] °  °CBG monitoring, ED     Status: Abnormal  ° Collection Time: 08/02/21  8:23 AM  °Result Value Ref Range  ° Glucose-Capillary 103 (H) 70 - 99 mg/dL  °  Comment: Glucose reference range applies only to samples taken after fasting for at least 8 hours.  ° ° °ECG  ° °N/A - Personally Reviewed ° °Telemetry  ° °Sinus bradycardia- Personally Reviewed ° °Radiology  °  °DG Chest Portable 1 View ° °Result Date: 08/01/2021 °CLINICAL DATA:  Chest pain EXAM: PORTABLE CHEST 1 VIEW COMPARISON:  07/15/2021 FINDINGS: The heart size and mediastinal contours are within normal limits. Both lungs are clear. The visualized skeletal structures are unremarkable. IMPRESSION: No acute abnormality of the lungs in AP portable projection. Electronically Signed   By: Alex D Bibbey M.D.   On: 08/01/2021 13:32   ° °Cardiac Studies  ° °Echo pending ° °Assessment  ° °Principal Problem: °  NSTEMI (non-ST elevated myocardial infarction) (HCC) ° ° °Plan  ° °Tiffany Odonnell denies any further chest pain overnight.  Troponin peaked over 1500.  She is scheduled for left heart catheterization today and likely will require coronary intervention.   Echo is pending.  She remains on heparin, aspirin and beta-blocker.  Her bradycardia was notable this morning and switching her from atenolol to carvedilol.  I have lowered the dose to 6.25 mg twice daily.  We will further increase atorvastatin to 80 mg nightly.  Target LDL less than 55 as she would be considered very high risk given comorbidities based on the newest guidelines.  Additional outpatient therapy to target her LDL lower. ° °Time Spent Directly with Patient: ° °I have spent a total of 25 minutes with the patient reviewing hospital notes, telemetry, EKGs, labs and examining the patient as well as establishing an assessment and plan that was discussed personally with the patient.  > 50% of time was spent in direct patient care. ° °Length of Stay: ° LOS: 1 day  ° °Jozee Hammer C. Breeley Bischof, MD, FACC, FACP  °[place]   [facility]  °Medical Director of the [facility] &  °Cardiovascular Risk Reduction [facility] °Diplomate of the American Board of Clinical Lipidology °Attending Cardiologist  °Direct Dial: [phone]   Fax: [phone]  °Website:  [email or web address] ° °Jobeth Pangilinan C Chael Urenda °08/02/2021, 8:58 AM ° ° °

## 2021-08-02 NOTE — Progress Notes (Signed)
ANTICOAGULATION CONSULT NOTE   Pharmacy Consult for heparin Indication: chest pain/ACS  Allergies  Allergen Reactions   Amoxicillin-Pot Clavulanate Nausea And Vomiting   Ciprofloxacin Hives   Sulfa Antibiotics Other (See Comments)    Trembling, paralyzing pain, red eyes, skin crawling   Latex Itching   Macrolides And Ketolides Diarrhea and Nausea And Vomiting   Tetracyclines & Related Other (See Comments) and Tinitus    Vertigo    Patient Measurements: Height: 5\' 1"  (154.9 cm) Weight: 70.3 kg (155 lb) IBW/kg (Calculated) : 47.8 Heparin Dosing Weight: 63kg  Vital Signs: BP: 126/88 (01/31 1745) Pulse Rate: 67 (01/31 1745)  Labs: Recent Labs    08/01/21 1321 08/01/21 1522 08/01/21 2237 08/01/21 2348 08/02/21 0400  HGB 12.5  --   --   --  13.7  HCT 37.4  --   --   --  40.6  PLT 285  --   --   --  300  HEPARINUNFRC  --   --   --  0.42 0.48  CREATININE 0.99  --   --   --  0.87  TROPONINIHS 73* 433* 1,543*  --   --      Estimated Creatinine Clearance: 54 mL/min (by C-G formula based on SCr of 0.87 mg/dL).   Medical History: Past Medical History:  Diagnosis Date   Anxiety    Blood in urine    Cervical disc disorder    bulging disc   Diabetes mellitus without complication (HCC)    Dizziness    Falls    Fibromyalgia    H/O Salmonella gastroenteritis    High cholesterol    History of frequent urinary tract infections    History of hiatal hernia    History of vertigo    Hypertension    Imbalance    Kidney stones    Lichen planopilaris    Lumbar herniated disc    L5-L6   Numbness and tingling    hands and feet bilat    Oscillopsia    Peripheral neuropathy    Septicemia ([place])    2013   Sinusitis    Sjogren's disease (HCC)    Tinnitus    Wears glasses      Assessment: 86 YOF presenting with CP and found to have complex, 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 Pending consult for CABG. No anticoagulation prior to admission.  Pharmacy consulted for heparin.     TR band off at 18:40. H/H, plt stable.    Goal of Therapy:  Heparin level 0.3-0.7 units/ml Monitor platelets by anticoagulation protocol: Yes   Plan:  Restart heparin gtt at 800 units/hr at midnight  Daily heparin level, CBC, s/s bleeding F/u TCTS recs      Benetta Spar, PharmD, BCPS, [facility] Clinical Pharmacist  Please check AMION for all [place] phone numbers After 10:00 PM, call [place] [phone]

## 2021-08-02 NOTE — Interval H&P Note (Signed)
Cath Lab Visit (complete for each Cath Lab visit)  Clinical Evaluation Leading to the Procedure:   ACS: Yes.    Non-ACS:    Anginal Classification: CCS IV  Anti-ischemic medical therapy: Minimal Therapy (1 class of medications)  Non-Invasive Test Results: No non-invasive testing performed  Prior CABG: No previous CABG      History and Physical Interval Note:  08/02/2021 3:33 PM  Tiffany Odonnell  has presented today for surgery, with the diagnosis of chest pain.  The various methods of treatment have been discussed with the patient and family. After consideration of risks, benefits and other options for treatment, the patient has consented to  Procedure(s): LEFT HEART CATH AND CORONARY ANGIOGRAPHY (N/A) as a surgical intervention.  The patient's history has been reviewed, patient examined, no change in status, stable for surgery.  I have reviewed the patient's chart and labs.  Questions were answered to the patient's satisfaction.     Lance Muss

## 2021-08-02 NOTE — Progress Notes (Signed)
DAILY PROGRESS NOTE   Patient Name: Tiffany Odonnell Date of Encounter: 08/02/2021 Cardiologist: Pixie Casino, MD  Chief Complaint   No chest pain  Patient Profile   Tiffany Odonnell is a 71 y.o. female originally from [place], with a hx of DM, HLD, HTN, neuropathy, Sjogren's dz, anxiety, who is being seen 08/01/2021 for the evaluation of chest pain at the request of Dr Mariane Masters.  Subjective   No further chest pain overnight.  BP well-controlled. Noted to be bradycardic. Switched from atenolol to carvedilol - will decrease to 6.25 mg BID. Troponin rose further (73, 433, 1543) - consistent with NSTEMI. Plan for LHC today. TC 222, TG 98, HDL 56, LDL 146 - will increase atorvastatin to 80 mg  QHS.  Objective   Vitals:   08/02/21 0400 08/02/21 0500 08/02/21 0600 08/02/21 0700  BP: 110/73 109/75 103/63 112/68  Pulse: (!) 51 (!) 53 (!) 48 (!) 56  Resp: 13 15 14 12   Temp:      TempSrc:      SpO2: 98% 98% 97% 98%  Weight:      Height:       No intake or output data in the 24 hours ending 08/02/21 0858 Filed Weights   08/01/21 1256  Weight: 70.3 kg    Physical Exam   General appearance: alert and no distress Lungs: clear to auscultation bilaterally Heart: regular rate and rhythm, S1, S2 normal, no murmur, click, rub or gallop Extremities: extremities normal, atraumatic, no cyanosis or edema Neurologic: Grossly normal  Inpatient Medications    Scheduled Meds:  aspirin EC  81 mg Oral Daily   atorvastatin  40 mg Oral Daily   carvedilol  12.5 mg Oral BID WC   insulin aspart  0-15 Units Subcutaneous TID WC   insulin aspart  0-5 Units Subcutaneous QHS   magnesium oxide  400 mg Oral Daily   potassium chloride  20 mEq Oral Daily   sodium chloride flush  3 mL Intravenous Q12H    Continuous Infusions:  sodium chloride     sodium chloride 100 mL/hr at 08/02/21 0354   heparin 800 Units/hr (08/01/21 1526)    PRN Meds: sodium chloride, acetaminophen,  ALPRAZolam, cyclobenzaprine, docusate sodium, nitroGLYCERIN, ondansetron (ZOFRAN) IV, sodium chloride flush, zolpidem   Labs   Results for orders placed or performed during the hospital encounter of 08/01/21 (from the past 48 hour(s))  Basic metabolic panel     Status: Abnormal   Collection Time: 08/01/21  1:21 PM  Result Value Ref Range   Sodium 134 (L) 135 - 145 mmol/L   Potassium 3.2 (L) 3.5 - 5.1 mmol/L   Chloride 99 98 - 111 mmol/L   CO2 23 22 - 32 mmol/L   Glucose, Bld 134 (H) 70 - 99 mg/dL    Comment: Glucose reference range applies only to samples taken after fasting for at least 8 hours.   BUN 22 8 - 23 mg/dL   Creatinine, Ser 0.99 0.44 - 1.00 mg/dL   Calcium 9.1 8.9 - 10.3 mg/dL   GFR, Estimated >60 >60 mL/min    Comment: (NOTE) Calculated using the CKD-EPI Creatinine Equation (2021)    Anion gap 12 5 - 15    Comment: Performed at [facility] [address], [place], [place] [address]  CBC     Status: None   Collection Time: 08/01/21  1:21 PM  Result Value Ref Range   WBC 7.0 4.0 - 10.5 K/uL  RBC 4.00 3.87 - 5.11 MIL/uL   Hemoglobin 12.5 12.0 - 15.0 g/dL   HCT 37.4 36.0 - 46.0 %   MCV 93.5 80.0 - 100.0 fL   MCH 31.3 26.0 - 34.0 pg   MCHC 33.4 30.0 - 36.0 g/dL   RDW 12.1 11.5 - 15.5 %   Platelets 285 150 - 400 K/uL   nRBC 0.0 0.0 - 0.2 %    Comment: Performed at [facility], [place] [address], [place], [place] [address]  Troponin I (High Sensitivity)     Status: Abnormal   Collection Time: 08/01/21  1:21 PM  Result Value Ref Range   Troponin I (High Sensitivity) 73 (H) <18 ng/L    Comment: (NOTE) Elevated high sensitivity troponin I (hsTnI) values and significant  changes across serial measurements may suggest ACS but many other  chronic and acute conditions are known to elevate hsTnI results.  Refer to the "Links" section for chest pain algorithms and additional  guidance. Performed at [facility], [place] [address], [place],  [place] [address]   Magnesium     Status: None   Collection Time: 08/01/21  1:21 PM  Result Value Ref Range   Magnesium 1.7 1.7 - 2.4 mg/dL    Comment: Performed at [facility] [address], [place], [place] [address]  Resp Panel by RT-PCR (Flu A&B, Covid) Nasopharyngeal Swab     Status: None   Collection Time: 08/01/21  2:40 PM   Specimen: Nasopharyngeal Swab; Nasopharyngeal(NP) swabs in vial transport medium  Result Value Ref Range   SARS Coronavirus 2 by RT PCR NEGATIVE NEGATIVE    Comment: (NOTE) SARS-CoV-2 target nucleic acids are NOT DETECTED.  The SARS-CoV-2 RNA is generally detectable in upper respiratory specimens during the acute phase of infection. The lowest concentration of SARS-CoV-2 viral copies this assay can detect is 138 copies/mL. A negative result does not preclude SARS-Cov-2 infection and should not be used as the sole basis for treatment or other patient management decisions. A negative result may occur with  improper specimen collection/handling, submission of specimen other than nasopharyngeal swab, presence of viral mutation(s) within the areas targeted by this assay, and inadequate number of viral copies(<138 copies/mL). A negative result must be combined with clinical observations, patient history, and epidemiological information. The expected result is Negative.  Fact Sheet for Patients:  EntrepreneurPulse.com.au  Fact Sheet for Healthcare Providers:  IncredibleEmployment.be  This test is no t yet approved or cleared by the [place] FDA and  has been authorized for detection and/or diagnosis of SARS-CoV-2 by FDA under an Emergency Use Authorization (EUA). This EUA will remain  in effect (meaning this test can be used) for the duration of the COVID-19 declaration under Section 564(b)(1) of the Act, 21 U.S.C.section 360bbb-3(b)(1), unless the authorization is terminated  or revoked sooner.       Influenza  A by PCR NEGATIVE NEGATIVE   Influenza B by PCR NEGATIVE NEGATIVE    Comment: (NOTE) The Xpert Xpress SARS-CoV-2/FLU/RSV plus assay is intended as an aid in the diagnosis of influenza from Nasopharyngeal swab specimens and should not be used as a sole basis for treatment. Nasal washings and aspirates are unacceptable for Xpert Xpress SARS-CoV-2/FLU/RSV testing.  Fact Sheet for Patients: EntrepreneurPulse.com.au  Fact Sheet for Healthcare Providers: IncredibleEmployment.be  This test is not yet approved or cleared by the [place] FDA and has been authorized for detection and/or diagnosis of SARS-CoV-2 by FDA under an Emergency  Use Authorization (EUA). This EUA will remain in effect (meaning this test can be used) for the duration of the COVID-19 declaration under Section 564(b)(1) of the Act, 21 U.S.C. section 360bbb-3(b)(1), unless the authorization is terminated or revoked.  Performed at [facility], [place] [address], [place], [place] [address]   Troponin I (High Sensitivity)     Status: Abnormal   Collection Time: 08/01/21  3:22 PM  Result Value Ref Range   Troponin I (High Sensitivity) 433 (HH) <18 ng/L    Comment: CRITICAL RESULT CALLED TO, READ BACK BY AND VERIFIED WITH: A BANK,RN 1625 08/01/2021 WBOND (NOTE) Elevated high sensitivity troponin I (hsTnI) values and significant  changes across serial measurements may suggest ACS but many other  chronic and acute conditions are known to elevate hsTnI results.  Refer to the Links section for chest pain algorithms and additional  guidance. Performed at [facility], [place] [address], [place], [place] [address]   HIV Antibody (routine testing w rflx)     Status: None   Collection Time: 08/01/21 10:37 PM  Result Value Ref Range   HIV Screen 4th Generation wRfx Non Reactive Non Reactive    Comment: Performed at [facility], [place] [address] [place], [place] [address]   TSH     Status: None   Collection Time: 08/01/21 10:37 PM  Result Value Ref Range   TSH 0.871 0.350 - 4.500 uIU/mL    Comment: Performed by a 3rd Generation assay with a functional sensitivity of <=0.01 uIU/mL. Performed at [facility], [place] [address], [place], [place] [address]   Troponin I (High Sensitivity)     Status: Abnormal   Collection Time: 08/01/21 10:37 PM  Result Value Ref Range   Troponin I (High Sensitivity) 1,543 (HH) <18 ng/L    Comment: CRITICAL VALUE NOTED.  VALUE IS CONSISTENT WITH PREVIOUSLY REPORTED AND CALLED VALUE. (NOTE) Elevated high sensitivity troponin I (hsTnI) values and significant  changes across serial measurements may suggest ACS but many other  chronic and acute conditions are known to elevate hsTnI results.  Refer to the Links section for chest pain algorithms and additional  guidance. Performed at [facility], [place] [address], [place], [place] [address]   Hemoglobin A1c     Status: Abnormal   Collection Time: 08/01/21 10:37 PM  Result Value Ref Range   Hgb A1c MFr Bld 5.9 (H) 4.8 - 5.6 %    Comment: (NOTE) Pre diabetes:          5.7%-6.4%  Diabetes:              >6.4%  Glycemic control for   <7.0% adults with diabetes    Mean Plasma Glucose 122.63 mg/dL    Comment: Performed at [facility] [address] [place], [place] [address]  Heparin level (unfractionated)     Status: None   Collection Time: 08/01/21 11:48 PM  Result Value Ref Range   Heparin Unfractionated 0.42 0.30 - 0.70 IU/mL    Comment: (NOTE) The clinical reportable range upper limit is being lowered to >1.10 to align with the FDA approved guidance for the current laboratory assay.  If heparin results are below expected values, and patient dosage has  been confirmed, suggest follow up testing of antithrombin III levels. Performed at [facility], [place] [address], [place], [place] [address]   Heparin level (unfractionated)     Status: None  Collection Time: 08/02/21  4:00 AM  Result Value Ref Range   Heparin Unfractionated 0.48 0.30 - 0.70 IU/mL    Comment: (NOTE) The clinical reportable range upper limit is being lowered to >1.10 to align with the FDA approved guidance for the current laboratory assay.  If heparin results are below expected values, and patient dosage has  been confirmed, suggest follow up testing of antithrombin III levels. Performed at [facility], [place] [address], [place], [place] [address]   CBC     Status: None   Collection Time: 08/02/21  4:00 AM  Result Value Ref Range   WBC 8.0 4.0 - 10.5 K/uL   RBC 4.34 3.87 - 5.11 MIL/uL   Hemoglobin 13.7 12.0 - 15.0 g/dL   HCT 40.6 36.0 - 46.0 %   MCV 93.5 80.0 - 100.0 fL   MCH 31.6 26.0 - 34.0 pg   MCHC 33.7 30.0 - 36.0 g/dL   RDW 12.2 11.5 - 15.5 %   Platelets 300 150 - 400 K/uL   nRBC 0.0 0.0 - 0.2 %    Comment: Performed at [facility], [place] [address] [place], [place] [address]  Comprehensive metabolic panel     Status: Abnormal   Collection Time: 08/02/21  4:00 AM  Result Value Ref Range   Sodium 138 135 - 145 mmol/L   Potassium 3.9 3.5 - 5.1 mmol/L   Chloride 103 98 - 111 mmol/L   CO2 26 22 - 32 mmol/L   Glucose, Bld 100 (H) 70 - 99 mg/dL    Comment: Glucose reference range applies only to samples taken after fasting for at least 8 hours.   BUN 17 8 - 23 mg/dL   Creatinine, Ser 0.87 0.44 - 1.00 mg/dL   Calcium 9.5 8.9 - 10.3 mg/dL   Total Protein 7.7 6.5 - 8.1 g/dL   Albumin 3.6 3.5 - 5.0 g/dL   AST 32 15 - 41 U/L   ALT 21 0 - 44 U/L   Alkaline Phosphatase 62 38 - 126 U/L   Total Bilirubin 0.9 0.3 - 1.2 mg/dL   GFR, Estimated >60 >60 mL/min    Comment: (NOTE) Calculated using the CKD-EPI Creatinine Equation (2021)    Anion gap 9 5 - 15    Comment: Performed at [facility] [address], [place], [place] [address]  Lipid panel     Status: Abnormal   Collection Time: 08/02/21  4:00 AM  Result Value Ref Range    Cholesterol 222 (H) 0 - 200 mg/dL   Triglycerides 98 <150 mg/dL   HDL 56 >40 mg/dL   Total CHOL/HDL Ratio 4.0 RATIO   VLDL 20 0 - 40 mg/dL   LDL Cholesterol 146 (H) 0 - 99 mg/dL    Comment:        Total Cholesterol/HDL:CHD Risk Coronary Heart Disease Risk Table                     Men   Women  1/2 Average Risk   3.4   3.3  Average Risk       5.0   4.4  2 X Average Risk   9.6   7.1  3 X Average Risk  23.4   11.0        Use the calculated Patient Ratio above and the CHD Risk Table to determine the patient's CHD Risk.        ATP III CLASSIFICATION (LDL):  <  100     mg/dL   Optimal  100-129  mg/dL   Near or Above                    Optimal  130-159  mg/dL   Borderline  160-189  mg/dL   High  >190     mg/dL   Very High Performed at [facility] [address] [place], [place] [address]   CBG monitoring, ED     Status: Abnormal   Collection Time: 08/02/21  8:23 AM  Result Value Ref Range   Glucose-Capillary 103 (H) 70 - 99 mg/dL    Comment: Glucose reference range applies only to samples taken after fasting for at least 8 hours.    ECG   N/A - Personally Reviewed  Telemetry   Sinus bradycardia- Personally Reviewed  Radiology    DG Chest Portable 1 View  Result Date: 08/01/2021 CLINICAL DATA:  Chest pain EXAM: PORTABLE CHEST 1 VIEW COMPARISON:  07/15/2021 FINDINGS: The heart size and mediastinal contours are within normal limits. Both lungs are clear. The visualized skeletal structures are unremarkable. IMPRESSION: No acute abnormality of the lungs in AP portable projection. Electronically Signed   By: Delanna Ahmadi M.D.   On: 08/01/2021 13:32    Cardiac Studies   Echo pending  Assessment   Principal Problem:   NSTEMI (non-ST elevated myocardial infarction) [facility])   Plan   Ms. Lopezperez denies any further chest pain overnight.  Troponin peaked over 1500.  She is scheduled for left heart catheterization today and likely will require coronary intervention.   Echo is pending.  She remains on heparin, aspirin and beta-blocker.  Her bradycardia was notable this morning and switching her from atenolol to carvedilol.  I have lowered the dose to 6.25 mg twice daily.  We will further increase atorvastatin to 80 mg nightly.  Target LDL less than 55 as she would be considered very high risk given comorbidities based on the newest guidelines.  Additional outpatient therapy to target her LDL lower.  Time Spent Directly with Patient:  I have spent a total of 25 minutes with the patient reviewing hospital notes, telemetry, EKGs, labs and examining the patient as well as establishing an assessment and plan that was discussed personally with the patient.  > 50% of time was spent in direct patient care.  Length of Stay:  LOS: 1 day   Pixie Casino, MD, [facility], [place] Director of the Advanced Lipid Disorders &  Cardiovascular Risk Reduction Clinic Diplomate of the American Board of Clinical Lipidology Attending Cardiologist  Direct Dial: [phone]   Fax: [phone]  Website:  [email or web address] 08/02/2021, 8:58 AM

## 2021-08-02 NOTE — Progress Notes (Signed)
ANTICOAGULATION CONSULT NOTE   Pharmacy Consult for heparin Indication: chest pain/ACS  Allergies  Allergen Reactions   Amoxicillin-Pot Clavulanate Nausea And Vomiting   Ciprofloxacin Hives   Sulfa Antibiotics Other (See Comments)    Trembling, paralyzing pain, red eyes, skin crawling   Latex Itching   Macrolides And Ketolides Diarrhea and Nausea And Vomiting   Tetracyclines & Related Other (See Comments) and Tinitus    Vertigo    Patient Measurements: Height: 5\' 1"  (154.9 cm) Weight: 70.3 kg (155 lb) IBW/kg (Calculated) : 47.8 Heparin Dosing Weight: 63kg  Vital Signs: BP: 112/68 (01/31 0700) Pulse Rate: 56 (01/31 0700)  Labs: Recent Labs    08/01/21 1321 08/01/21 1522 08/01/21 2237 08/01/21 2348 08/02/21 0400  HGB 12.5  --   --   --  13.7  HCT 37.4  --   --   --  40.6  PLT 285  --   --   --  300  HEPARINUNFRC  --   --   --  0.42 0.48  CREATININE 0.99  --   --   --  0.87  TROPONINIHS 73* 433* 1,543*  --   --      Estimated Creatinine Clearance: 54 mL/min (by C-G formula based on SCr of 0.87 mg/dL).   Medical History: Past Medical History:  Diagnosis Date   Anxiety    Blood in urine    Cervical disc disorder    bulging disc   Diabetes mellitus without complication (HCC)    Dizziness    Falls    Fibromyalgia    H/O Salmonella gastroenteritis    High cholesterol    History of frequent urinary tract infections    History of hiatal hernia    History of vertigo    Hypertension    Imbalance    Kidney stones    Lichen planopilaris    Lumbar herniated disc    L5-L6   Numbness and tingling    hands and feet bilat    Oscillopsia    Peripheral neuropathy    Septicemia ([place])    2013   Sinusitis    Sjogren's disease ([place])    Tinnitus    Wears glasses      Assessment: 61 YOF presenting with CP, elevated troponin, she is not on anticoagulation PTA, CBC wnl  Heparin level remains therapeutic x2 on 800 units/hr, may go for cath today  Goal of  Therapy:  Heparin level 0.3-0.7 units/ml Monitor platelets by anticoagulation protocol: Yes   Plan:  Continue heparin gtt at 800 units/hr Daily heparin level, CBC, s/s bleeding F/u cards plan for cath  Bertis Ruddy, PharmD Clinical Pharmacist ED Pharmacist Phone # [phone] 08/02/2021 8:29 AM

## 2021-08-02 NOTE — Progress Notes (Signed)
TR BAND REMOVAL  LOCATION:    radial rt radial   DEFLATED PER PROTOCOL:   yes  TIME BAND OFF / DRESSING APPLIED:    1830/gauze and tegaderm  SITE UPON ARRIVAL:    Level 0  SITE AFTER BAND REMOVAL:    Level 0  CIRCULATION SENSATION AND MOVEMENT:    Within Normal Limits : yes, rt hand and fingers pink, palpable rt radial; good pleth waveform  COMMENTS:

## 2021-08-02 NOTE — Progress Notes (Signed)
TCTS consulted for CABG evaluation. °

## 2021-08-02 NOTE — Progress Notes (Signed)
Dr. Eldridge Dace in to look at rt forearm and is aware of c/o back discomfort when she takes a deep breath.

## 2021-08-02 NOTE — Progress Notes (Signed)
°  Echocardiogram 2D Echocardiogram has been performed.  Tiffany Odonnell 08/02/2021, 10:38 AM

## 2021-08-03 ENCOUNTER — Inpatient Hospital Stay (HOSPITAL_COMMUNITY): Payer: Medicare Other

## 2021-08-03 ENCOUNTER — Encounter (HOSPITAL_COMMUNITY): Payer: Self-pay | Admitting: Interventional Cardiology

## 2021-08-03 DIAGNOSIS — Z01810 Encounter for preprocedural cardiovascular examination: Secondary | ICD-10-CM | POA: Diagnosis not present

## 2021-08-03 DIAGNOSIS — I214 Non-ST elevation (NSTEMI) myocardial infarction: Secondary | ICD-10-CM

## 2021-08-03 DIAGNOSIS — E119 Type 2 diabetes mellitus without complications: Secondary | ICD-10-CM

## 2021-08-03 DIAGNOSIS — I2510 Atherosclerotic heart disease of native coronary artery without angina pectoris: Secondary | ICD-10-CM

## 2021-08-03 LAB — URINALYSIS, COMPLETE (UACMP) WITH MICROSCOPIC
Bilirubin Urine: NEGATIVE
Glucose, UA: NEGATIVE mg/dL
Ketones, ur: NEGATIVE mg/dL
Nitrite: POSITIVE — AB
Protein, ur: NEGATIVE mg/dL
Specific Gravity, Urine: 1.015 (ref 1.005–1.030)
pH: 6 (ref 5.0–8.0)

## 2021-08-03 LAB — GLUCOSE, CAPILLARY
Glucose-Capillary: 113 mg/dL — ABNORMAL HIGH (ref 70–99)
Glucose-Capillary: 113 mg/dL — ABNORMAL HIGH (ref 70–99)
Glucose-Capillary: 111 mg/dL — ABNORMAL HIGH (ref 70–99)

## 2021-08-03 LAB — CBC
HCT: 39.1 % (ref 36.0–46.0)
Hemoglobin: 13 g/dL (ref 12.0–15.0)
MCH: 31 pg (ref 26.0–34.0)
MCHC: 33.2 g/dL (ref 30.0–36.0)
MCV: 93.1 fL (ref 80.0–100.0)
Platelets: 272 10*3/uL (ref 150–400)
RBC: 4.2 MIL/uL (ref 3.87–5.11)
RDW: 12.4 % (ref 11.5–15.5)
WBC: 7.7 10*3/uL (ref 4.0–10.5)
nRBC: 0 % (ref 0.0–0.2)

## 2021-08-03 LAB — PULMONARY FUNCTION TEST
FEF 25-75 Pre: 1.91 L/s
FEF2575-%Pred-Pre: 110 %
FEV1-%Pred-Pre: 101 %
FEV1-Pre: 2.02 L
FEV1FVC-%Pred-Pre: 103 %
FEV6-%Pred-Pre: 101 %
FEV6-Pre: 2.54 L
FEV6FVC-%Pred-Pre: 103 %
FVC-%Pred-Pre: 97 %
FVC-Pre: 2.57 L
Pre FEV1/FVC ratio: 78 %
Pre FEV6/FVC Ratio: 99 %

## 2021-08-03 LAB — HEPARIN LEVEL (UNFRACTIONATED)
Heparin Unfractionated: 0.11 [IU]/mL — ABNORMAL LOW (ref 0.30–0.70)
Heparin Unfractionated: 0.23 IU/mL — ABNORMAL LOW (ref 0.30–0.70)

## 2021-08-03 MED ORDER — INSULIN ASPART 100 UNIT/ML IJ SOLN
0.0000 [IU] | Freq: Three times a day (TID) | INTRAMUSCULAR | Status: DC
  Administered 2021-08-03: 2 [IU] via SUBCUTANEOUS
  Administered 2021-08-04 – 2021-08-05 (×2): 3 [IU] via SUBCUTANEOUS

## 2021-08-03 MED ORDER — DIPHENHYDRAMINE-ZINC ACETATE 2-0.1 % EX CREA
TOPICAL_CREAM | Freq: Two times a day (BID) | CUTANEOUS | Status: DC | PRN
  Filled 2021-08-03: qty 28

## 2021-08-03 NOTE — Progress Notes (Signed)
Pt has developed a rash on her chest. Pt reports it is due to the cardiac telemetry leads. Pt requests Benadryl. MD paged.

## 2021-08-03 NOTE — Consult Note (Addendum)
[address]       [place] [address]             [phone]        Tiffany [place] Medical Record [id number] Date of Birth: Feb 08, 1951  Referring: Tiffany [place] Primary Care: Tiffany Fiedler, FNP Primary Cardiologist:Tiffany Wells Guiles, MD  Chief Complaint:    Chief Complaint  Patient presents with   Chest Pain    History of Present Illness:      Tiffany [place] is a 71 yo female with known history of DM, HLD, HTN, Neuropathy, Sjogrens Syndrome, Lichen planus, anxiety.  The patient also has a history kidney stones with recent procedure performed in January. The patient developed complaints of chest pain on 1/30 in the morning.  The pain was described as a heaviness and burning sensation.  She checked her blood sugar which was normal at 119.  She states the pain did not resolve and she instructed her grand-daughter to call EMS.  On arrival she was hypertensive with SBP of 200.  EKG was also concerning.  She was treated with ASA and NTG which relieved her pain.  The patient states her pain had went to her left shoulder and she was associated with cold sweats/diaphoresis, mild shortness of breath.  Troponin levels were elevated and she was ruled in for NSTEMI.  She was admitted and started on Heparin therapy.  It was felt  she would require coronary catheterization which was performed 08/02/2021 which showed multivessel CAD.  It was felt the patient would require coronary bypass procedure and Cardiothoracic surgery consultation was requested.  The patient is currently chest pain free.  She denies nicotine use.  The patient'Odon[place] diabetes is usually medically managed and her preoperative A1c is 5.9.  The patient does state she has problems with balance and dizziness at times.  Patient is concerned she has a UTI.  Her daughter was on the phone and states this has been a chronic issue.  She has been living in the [place] for the past 3 years.  Current Activity/ Functional  Status: Patient is independent with mobility/ambulation, transfers, ADL'Odonnell, IADL'Odonnell.   Zubrod Score: At the time of surgery this patients most appropriate activity status/level should be described as: [x]     0    Normal activity, no symptoms []     1    Restricted in physical strenuous activity but ambulatory, able to do out light work []     2    Ambulatory and capable of self care, unable to do work activities, up and about                 more than 50%  Of the time                            []     3    Only limited self care, in bed greater than 50% of waking hours []     4    Completely disabled, no self care, confined to bed or chair []     5    Moribund  Past Medical History:  Diagnosis Date   Anxiety    Blood in urine    Cervical disc disorder    bulging disc   Diabetes mellitus without complication (HCC)    Dizziness    Falls    Fibromyalgia    H/O Salmonella gastroenteritis    High cholesterol  History of frequent urinary tract infections   ° History of hiatal hernia   ° History of vertigo   ° Hypertension   ° Imbalance   ° Kidney stones   ° Lichen planopilaris   ° Lumbar herniated disc   ° L5-L6  ° Numbness and tingling   ° hands and feet bilat   ° Oscillopsia   ° Peripheral neuropathy   ° Septicemia (HCC)   ° 2013  ° Sinusitis   ° Sjogren'Odonnell disease (HCC)   ° Tinnitus   ° Wears glasses   ° ° °Past Surgical History:  °Procedure Laterality Date  ° ABDOMINAL HYSTERECTOMY    ° 2014  ° IR URETERAL STENT RIGHT NEW ACCESS W/O SEP NEPHROSTOMY CATH  07/15/2021  ° KNEE ARTHROSCOPY WITH MEDIAL MENISECTOMY Left 10/29/2019  ° Procedure: KNEE ARTHROSCOPY WITH MEDIAL AND LATERAL MENISECTOMY WITH DEBRIDEMENT;  Surgeon: Tiffany, Jeffrey, MD;  Location: [facility];  Service: Orthopedics;  Laterality: Left;  60 mins  ° LEFT HEART CATH AND CORONARY ANGIOGRAPHY N/A 08/02/2021  ° Procedure: LEFT HEART CATH AND CORONARY ANGIOGRAPHY;  Surgeon: Tiffany, Jayadeep S, MD;  Location: [facility];  Service:  Cardiovascular;  Laterality: N/A;  ° NASAL SINUS SURGERY    ° NEPHROLITHOTOMY Right 05/31/2015  ° Procedure: RIGHT PERCUTANEOUS NEPHROLITHOTOMY  ;  Surgeon: Tiffany Ottelin, MD;  Location: [facility];  Service: Urology;  Laterality: Right;  ° NEPHROLITHOTOMY Right 07/15/2021  ° Procedure: NEPHROLITHOTOMY PERCUTANEOUS;  Surgeon: Tiffany, Matthew R, MD;  Location: [facility];  Service: Urology;  Laterality: Right;  ° prolapsed bladder and rectocele surgery     ° TUBAL LIGATION    ° 1983  ° URETERAL STENT PLACEMENT    ° times 2 secondary to kidney stone   ° ° °Social History  ° °Tobacco Use  °Smoking Status Never  °Smokeless Tobacco Never  °  °Social History  ° °Substance and Sexual Activity  °Alcohol Use No  ° ° ° °Allergies  °Allergen Reactions  ° Amoxicillin-Pot Clavulanate Nausea And Vomiting  ° Ciprofloxacin Hives  ° Sulfa Antibiotics Other (See Comments)  °  Trembling, paralyzing pain, red eyes, skin crawling  ° Latex Itching  ° Macrolides And Ketolides Diarrhea and Nausea And Vomiting  ° Tetracyclines & Related Other (See Comments) and Tinitus  °  Vertigo  ° ° °Current Facility-Administered Medications  °Medication Dose Route Frequency Provider Last Rate Last Admin  ° 0.9 %  sodium chloride infusion  250 mL Intravenous PRN Tiffany, Jayadeep S, MD      ° 0.9 %  sodium chloride infusion  250 mL Intravenous PRN Tiffany, Jayadeep S, MD      ° acetaminophen (TYLENOL) tablet 650 mg  650 mg Oral Q4H PRN Tiffany, Jayadeep S, MD   650 mg at 08/02/21 2052  ° ALPRAZolam (XANAX) tablet 0.25 mg  0.25 mg Oral BID PRN Tiffany, Jayadeep S, MD      ° aspirin EC tablet 81 mg  81 mg Oral Daily Tiffany, Jayadeep S, MD   81 mg at 08/03/21 0825  ° atorvastatin (LIPITOR) tablet 80 mg  80 mg Oral QHS Tiffany, Jayadeep S, MD      ° carvedilol (COREG) tablet 6.25 mg  6.25 mg Oral BID WC Tiffany, Jayadeep S, MD   6.25 mg at 08/03/21 0825  ° cyclobenzaprine (FLEXERIL) tablet 2.5-5 mg  2.5-5 mg Oral TID PRN Tiffany, Jayadeep S, MD      °  diphenhydrAMINE-zinc acetate (BENADRYL) 2-0.1 % cream     Topical BID PRN Tiffany Casino, MD       docusate sodium (COLACE) capsule 100 mg  100 mg Oral Daily PRN Tiffany Booze, MD       heparin ADULT infusion 100 units/mL (25000 units/266mL)  950 Units/hr Intravenous Continuous Wendee Beavers, RPH 9.5 mL/hr at 08/03/21 1021 950 Units/hr at 08/03/21 1021   insulin aspart (novoLOG) injection 0-15 Units  0-15 Units Subcutaneous TID WC Tiffany Casino, MD   2 Units at 08/03/21 0825   insulin aspart (novoLOG) injection 0-5 Units  0-5 Units Subcutaneous QHS Tiffany Booze, MD       magnesium oxide (MAG-OX) tablet 400 mg  400 mg Oral Daily Tiffany Booze, MD   400 mg at 08/03/21 0825   nitroGLYCERIN (NITROSTAT) SL tablet 0.4 mg  0.4 mg Sublingual Q5 Min x 3 PRN Tiffany Booze, MD       ondansetron [facility]) injection 4 mg  4 mg Intravenous Q6H PRN Tiffany Booze, MD       potassium chloride SA (KLOR-CON M) CR tablet 20 mEq  20 mEq Oral Daily Tiffany Booze, MD   20 mEq at 08/03/21 0824   sodium chloride flush (NS) 0.9 % injection 3 mL  3 mL Intravenous Q12H Tiffany Booze, MD   3 mL at 08/03/21 0827   sodium chloride flush (NS) 0.9 % injection 3 mL  3 mL Intravenous PRN Tiffany Booze, MD       sodium chloride flush (NS) 0.9 % injection 3 mL  3 mL Intravenous Q12H Tiffany Booze, MD       sodium chloride flush (NS) 0.9 % injection 3 mL  3 mL Intravenous PRN Tiffany Booze, MD       zolpidem (AMBIEN) tablet 5 mg  5 mg Oral QHS PRN Tiffany Booze, MD        Medications Prior to Admission  Medication Sig Dispense Refill Last Dose   acetaminophen (TYLENOL) 500 MG tablet Take 500 mg by mouth daily as needed for headache (pain).   2 weeks ago   Ascorbic Acid (VITAMIN C) 1000 MG tablet Take 2,000 mg by mouth daily as needed (immune support).   couple days ago   atenolol (TENORMIN) 100 MG tablet Take 100 mg by mouth every morning.  3 08/01/2021 at  1000-1100   betamethasone dipropionate 0.05 % lotion Apply 1 application topically 2 (two) times daily as needed (scalp rash).  2 week ago   Carboxymethylcellulose Sodium (THERATEARS OP) Place 1 drop into both eyes 2 (two) times daily as needed (dry eyes).   week ago   cyclobenzaprine (FLEXERIL) 10 MG tablet Take 2.5-5 mg by mouth 3 (three) times daily as needed for muscle spasms. 1/4 - 1/2 tablet   3 weeks ago   docusate sodium (COLACE) 100 MG capsule Take 1 capsule (100 mg total) by mouth daily as needed for up to 30 doses. (Patient taking differently: Take 100 mg by mouth daily as needed (constipation).) 30 capsule 0 2 weeks ago   ECHINACEA PO Take 2 capsules by mouth daily as needed (immune system boost).   couple days ago   hydrocortisone (ANUSOL-HC) 2.5 % rectal cream Place 1 application rectally 3 (three) times daily as needed for hemorrhoids.   couple days ago   indapamide (LOZOL) 2.5 MG tablet Take 2.5 mg by mouth every morning.   07/31/2021   MAGNESIUM PO Take 1 tablet by mouth at bedtime as needed (relax  nervew).   3 weeks ago   Multiple Vitamins-Minerals (ZINC PO) Take 1 tablet by mouth at bedtime as needed (to relax nerves).   3 weeks ago   Potassium Citrate 15 MEQ (1620 MG) TBCR Take 30 mEq by mouth every morning.   07/31/2021   aspirin EC 81 MG tablet Take 1 tablet (81 mg total) by mouth daily. (Patient not taking: Reported on 08/01/2021) 14 tablet 1 Not Taking   oxyCODONE-acetaminophen (PERCOCET) 5-325 MG tablet Take 1 tablet by mouth every 4 (four) hours as needed for up to 18 doses for severe pain. (Patient not taking: Reported on 08/01/2021) 20 tablet 0 Not Taking    Family History  Problem Relation Age of Onset   CAD Mother    Diabetes Father    Non-Hodgkin'Odonnell lymphoma Brother    Lupus Other    Hypertension Other    Diabetes Other    Glaucoma Maternal Uncle    Non-Hodgkin'[place]donnell lymphoma Brother    Non-Hodgkin'[place]donnell lymphoma Brother        half brother    Review of Systems:    ROS   Cardiac Review of Systems: Y or  [    ]= no  Chest Pain [ Y, resolved   ]  Resting SOB [   ] Exertional SOB  [  ]  Orthopnea [  ]   Pedal Edema [  N ]    Palpitations Aqua.Slicker  ] Syncope  [  ]   Presyncope [   ]  General Review of Systems: [Y] = yes [  ]=no Constitional: recent weight change [  ]; anorexia [  ]; fatigue [  ]; nausea [  ]; night sweats [  ]; fever [  ]; or chills [  ]                                                               Dental: Last Dentist visit:   Eye : blurred vision [  ]; diplopia [   ]; vision changes [  ];  Amaurosis fugax[  ]; Resp: cough [ N ];  wheezing[  ];  hemoptysis[  ]; shortness of breath[  ]; paroxysmal nocturnal dyspnea[  ]; dyspnea on exertion[  ]; or orthopnea[  ];  GI:  gallstones[  ], vomiting[  ];  dysphagia[  ]; melena[  ];  hematochezia [  ]; heartburn[  ];   Hx of  Colonoscopy[  ]; GU: kidney stones [ Y ]; hematuria[  ];   dysuria [  ];  nocturia[  ];  history of     obstruction [  ]; urinary frequency [  ]             Skin: rash, swelling[ N ];, hair loss[  ];  peripheral edema[  ];  or itching[  ]; Musculosketetal: myalgias[  ];  joint swelling[  ];  joint erythema[  ];  joint pain[  ];  back pain[  ];  Heme/Lymph: bruising[  ];  bleeding[  ];  anemia[  ];  Neuro: TIA[  ];  headaches[  ];  stroke[  ];  vertigo[  ];  seizures[  ];   paresthesias[  ];  difficulty walking[ Y, balance, dizziness problems ];  Psych:depression[ N ]; anxiety[ N ];  Endocrine: diabetes[ Y ];  thyroid dysfunction[  ];        Physical Exam: BP 103/63 (BP Location: Left Arm)    Pulse 64    Temp 98 F (36.7 C) (Oral)    Resp 17    Ht 5\' 1"  (1.549 m)    Wt 68.1 kg Comment: scale A   SpO2 96%    BMI 28.38 kg/m   General appearance: alert, cooperative, and no distress Neck: no adenopathy, no carotid bruit, no JVD, supple, symmetrical, trachea midline, and thyroid not enlarged, symmetric, no tenderness/mass/nodules Resp: clear to auscultation bilaterally Cardio:  regular rate and rhythm GI: soft, non-tender; bowel sounds normal; no masses,  no organomegaly Extremities: extremities normal, atraumatic, no cyanosis or edema, mild telegenctasias Neurologic: Grossly normal,   Diagnostic Studies & Laboratory data:     Recent Radiology Findings:   CARDIAC CATHETERIZATION  Result Date: 08/03/2021   Prox LAD lesion is 75% stenosed.   Prox LAD to Mid LAD lesion is 100% stenosed.  Right to left collaterals fill the mid to distal LAD.   1st Diag lesion is 95% stenosed.   Prox RCA lesion is 40% stenosed.   Mid RCA lesion is 25% stenosed.   The left ventricular systolic function is normal.   LV end diastolic pressure is normal.   The left ventricular ejection fraction is 55-65% by visual estimate.   There is no aortic valve stenosis. 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  Plan for cardiac surgery consult.   DG Chest Portable 1 View  Result Date: 08/01/2021 CLINICAL DATA:  Chest pain EXAM: PORTABLE CHEST 1 VIEW COMPARISON:  07/15/2021 FINDINGS: The heart size and mediastinal contours are within normal limits. Both lungs are clear. The visualized skeletal structures are unremarkable. IMPRESSION: No acute abnormality of the lungs in AP portable projection. Electronically Signed   By: Delanna Ahmadi M.D.   On: 08/01/2021 13:32   ECHOCARDIOGRAM COMPLETE  Result Date: 08/02/2021    ECHOCARDIOGRAM REPORT   Patient Name:   MASHAL GARTLEY Date of Exam: 08/02/2021 Medical Rec #:  [id number]     Height:       61.0 in Accession #:    [id number]    Weight:       155.0 lb Date of Birth:  06/30/1951     BSA:          1.695 m Patient Age:    55 years      BP:           112/68 mmHg Patient Gender: F             HR:           58 bpm. Exam Location:  Inpatient Procedure: 2D Echo, Color Doppler and Cardiac Doppler Indications:    NSTEMI I21.4  History:        Patient has no prior history of Echocardiogram examinations.                  Risk Factors:Hypertension and Diabetes.  Sonographer:    Bernadene Person RDCS Referring Phys: [address] [place]  1. Left ventricular ejection fraction, by estimation, is 60 to 65%. The left ventricle has normal function. The left ventricle has no regional wall motion abnormalities. Left ventricular diastolic parameters are consistent with Grade I diastolic dysfunction (impaired relaxation).  2. Right ventricular systolic function is normal. The right ventricular size is normal. There is normal  pulmonary artery systolic pressure.  3. The mitral valve is normal in structure. Mild mitral valve regurgitation. No evidence of mitral stenosis.  4. The aortic valve is tricuspid. There is mild calcification of the aortic valve. Aortic valve regurgitation is not visualized. Aortic valve sclerosis is present, with no evidence of aortic valve stenosis.  5. The inferior vena cava is normal in size with greater than 50% respiratory variability, suggesting right atrial pressure of 3 mmHg. FINDINGS  Left Ventricle: Left ventricular ejection fraction, by estimation, is 60 to 65%. The left ventricle has normal function. The left ventricle has no regional wall motion abnormalities. The left ventricular internal cavity size was normal in size. There is  no left ventricular hypertrophy. Left ventricular diastolic parameters are consistent with Grade I diastolic dysfunction (impaired relaxation). Right Ventricle: The right ventricular size is normal. No increase in right ventricular wall thickness. Right ventricular systolic function is normal. There is normal pulmonary artery systolic pressure. The tricuspid regurgitant velocity is 1.78 m/[place], and  with an assumed right atrial pressure of 3 mmHg, the estimated right ventricular systolic pressure is [id number] mmHg. Left Atrium: Left atrial size was normal in size. Right Atrium: Right atrial size was normal in size. Pericardium: There is no evidence of pericardial effusion. Mitral  Valve: The mitral valve is normal in structure. Mild mitral valve regurgitation. No evidence of mitral valve stenosis. Tricuspid Valve: The tricuspid valve is normal in structure. Tricuspid valve regurgitation is mild . No evidence of tricuspid stenosis. Aortic Valve: The aortic valve is tricuspid. There is mild calcification of the aortic valve. Aortic valve regurgitation is not visualized. Aortic valve sclerosis is present, with no evidence of aortic valve stenosis. Pulmonic Valve: The pulmonic valve was normal in structure. Pulmonic valve regurgitation is not visualized. No evidence of pulmonic stenosis. Aorta: The aortic root is normal in size and structure. Venous: The inferior vena cava is normal in size with greater than 50% respiratory variability, suggesting right atrial pressure of 3 mmHg. IAS/Shunts: No atrial level shunt detected by color flow Doppler.  LEFT VENTRICLE PLAX 2D LVIDd:         4.60 cm     Diastology LVIDs:         3.10 cm     LV e' medial:    5.59 cm/[place] LV PW:         0.90 cm     LV E/e' medial:  12.6 LV IVS:        1.00 cm     LV e' lateral:   7.28 cm/[place] LVOT diam:     1.80 cm     LV E/e' lateral: 9.7 LV SV:         53 LV SV Index:   31 LVOT Area:     2.54 cm  LV Volumes (MOD) LV vol d, MOD A2C: 81.1 ml LV vol d, MOD A4C: 74.2 ml LV vol [place], MOD A2C: 30.5 ml LV vol [place], MOD A4C: 28.2 ml LV SV MOD A2C:     50.6 ml LV SV MOD A4C:     74.2 ml LV SV MOD BP:      48.8 ml RIGHT VENTRICLE RV [place] prime:     9.07 cm/[place] TAPSE (M-mode): 2.1 cm LEFT ATRIUM           Index        RIGHT ATRIUM           Index LA diam:      3.40  cm 2.01 cm/m   RA Area:     14.70 cm LA Vol (A2C): 31.1 ml 18.35 ml/m  RA Volume:   38.00 ml  22.42 ml/m LA Vol (A4C): 38.1 ml 22.48 ml/m  AORTIC VALVE LVOT Vmax:   87.80 cm/[place] LVOT Vmean:  59.100 cm/[place] LVOT VTI:    0.209 m  AORTA Ao Root diam: 3.10 cm Ao Asc diam:  3.40 cm MITRAL VALVE               TRICUSPID VALVE MV Area (PHT): 3.68 cm    TR Peak grad:   12.7 mmHg MV Decel Time:  206 msec    TR Vmax:        178.00 cm/[place] MV E velocity: 70.70 cm/[place] MV A velocity: 63.00 cm/[place]  SHUNTS MV E/A ratio:  1.12        Systemic VTI:  0.21 m                            Systemic Diam: 1.80 cm Candee Furbish MD Electronically signed by Candee Furbish MD Signature Date/Time: 08/02/2021/11:51:16 AM    Final      I have independently reviewed the above radiologic studies and discussed with the patient   Recent Lab Findings: Lab Results  Component Value Date   WBC 7.7 08/03/2021   HGB 13.0 08/03/2021   HCT 39.1 08/03/2021   PLT 272 08/03/2021   GLUCOSE 100 (H) 08/02/2021   CHOL 222 (H) 08/02/2021   TRIG 98 08/02/2021   HDL 56 08/02/2021   LDLCALC 146 (H) 08/02/2021   ALT 21 08/02/2021   AST 32 08/02/2021   NA 138 08/02/2021   K 3.9 08/02/2021   CL 103 08/02/2021   CREATININE 0.87 08/02/2021   BUN 17 08/02/2021   CO2 26 08/02/2021   TSH 0.871 08/01/2021   INR 0.9 07/15/2021   HGBA1C 5.9 (H) 08/01/2021    Assessment / Plan:      CV- CAD, chest pain free... requesting coronary bypass DM- well controlled with diet and exercise, A1c is 5.9 H/O Kidney stones.. most recent treatment was in January HLD HTN Sjogren'[place] syndrome Lichen planopilaris UA/Culture to ensure no active UTI  Patient is currently chest pain free. Tentatively scheduled for CABG on Friday with Dr. Cyndia Bent.. he will evaluate and follow up with further recommendations  I  spent 55 minutes counseling the patient face to face.  Ellwood Handler, PA-C 08/03/2021 11:08 AM    Chart reviewed, patient examined, agree with above. The patient is a 71 year old woman with history of diabetes, hypertension, hyperlipidemia who reports starting an exercise program over the past few months to try to get her newly diagnosed diabetes under control.  She was walking on a treadmill and developed some intermittent chest discomfort.  She was also having some exertional fatigue.  Then in January she developed a large right kidney stone and  had to undergo right percutaneous nephrostomy tube placement with stenting of her ureter and stopped exercising after that.  She also had undergo banding of some internal hemorrhoids.  She was now admitted with substernal chest pain that she described as a burning sensation with heaviness.  She ruled in for non-ST segment elevation MI.  Cardiac catheterization shows severe two-vessel coronary disease with a complex calcified proximal and mid LAD chronic total occlusion with faint filling of the distal vessel by collaterals from the right.  There was a large diagonal branch that had  high-grade proximal stenosis.  The left circumflex had no significant disease.  The right coronary artery was diffusely diseased with calcific plaque and had a 40 to 50% shelflike proximal stenosis.  Left ventricular ejection fraction was normal by echocardiogram with no significant valvular abnormality.  I agree that coronary bypass graft surgery is the best treatment for her to improve her symptoms and prevent further myocardial damage.  She had a urinalysis since admission that showed large leukocytes and many bacteria.  Culture is growing greater than 100,000 E. coli.  Sensitivities are pending.  She remains afebrile with normal white blood cell count and denies any urinary symptoms at this time.  I suspect this is probably colonization and will be covered by her perioperative antibiotics.  Given the high-grade nature of her coronary disease I think would be best to proceed ahead with coronary bypass surgery. I discussed the operative procedure with the patient including alternatives, benefits and risks; including but not limited to bleeding, blood transfusion, infection, stroke, myocardial infarction, graft failure, heart block requiring a permanent pacemaker, organ dysfunction, and death.  Tiffany [place] understands and agrees to proceed.  We will schedule surgery for tomorrow morning.

## 2021-08-03 NOTE — Progress Notes (Signed)
Pt refused Lipitor. Pt educated. Pt elects to speak with MD in the AM.

## 2021-08-03 NOTE — Progress Notes (Signed)
Pre-CABG exams have been completed.  Results can be found under chart review under CV PROC. 08/03/2021 4:55 PM Ashlley Booher RVT, RDMS & Jean Rosenthal RVT, RDMS

## 2021-08-03 NOTE — Progress Notes (Signed)
ANTICOAGULATION CONSULT NOTE   Pharmacy Consult for heparin Indication: chest pain/ACS  Allergies  Allergen Reactions   Amoxicillin-Pot Clavulanate Nausea And Vomiting   Ciprofloxacin Hives   Sulfa Antibiotics Other (See Comments)    Trembling, paralyzing pain, red eyes, skin crawling   Latex Itching   Macrolides And Ketolides Diarrhea and Nausea And Vomiting   Tetracyclines & Related Other (See Comments) and Tinitus    Vertigo    Patient Measurements: Height: 5\' 1"  (154.9 cm) Weight: 68.1 kg (150 lb 3.2 oz) (scale A) IBW/kg (Calculated) : 47.8 Heparin Dosing Weight: 63kg  Vital Signs: Temp: 97.7 F (36.5 C) (02/01 0604) Temp Source: Oral (02/01 0737) BP: 109/71 (02/01 0737) Pulse Rate: 62 (02/01 0737)  Labs: Recent Labs    08/01/21 1321 08/01/21 1522 08/01/21 2237 08/01/21 2348 08/02/21 0400 08/03/21 0546  HGB 12.5  --   --   --  13.7 13.0  HCT 37.4  --   --   --  40.6 39.1  PLT 285  --   --   --  300 272  HEPARINUNFRC  --   --   --  0.42 0.48 0.11*  CREATININE 0.99  --   --   --  0.87  --   TROPONINIHS 73* 433* 1,543*  --   --   --      Estimated Creatinine Clearance: 53.1 mL/min (by C-G formula based on SCr of 0.87 mg/dL).   Medical History: Past Medical History:  Diagnosis Date   Anxiety    Blood in urine    Cervical disc disorder    bulging disc   Diabetes mellitus without complication (HCC)    Dizziness    Falls    Fibromyalgia    H/O Salmonella gastroenteritis    High cholesterol    History of frequent urinary tract infections    History of hiatal hernia    History of vertigo    Hypertension    Imbalance    Kidney stones    Lichen planopilaris    Lumbar herniated disc    L5-L6   Numbness and tingling    hands and feet bilat    Oscillopsia    Peripheral neuropathy    Septicemia ([place])    2013   Sinusitis    Sjogren's disease (HCC)    Tinnitus    Wears glasses      Assessment: 5 YOF presenting with CP and found to have  complex, 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 Pending consult for CABG. No anticoagulation prior to admission. Pharmacy consulted for heparin.     Post cardiac cath 08/02/21 PM.  Heparin drip  restarted at midnight last night at rate 800 units/hr.  Heparin level is 0.11 this morning, subtherapeutic on 800 units/hr. No issues/ interruptions with heparin IV reported per RN.  No bleeding noted, CBC wnl/stable F/u CABG consult   Goal of Therapy:  Heparin level 0.3-0.7 units/ml Monitor platelets by anticoagulation protocol: Yes   Plan:  Increase heparin gtt to 950 units/h Check HL in ~ 6 hr after rate increased.  Daily heparin level, CBC, s/s bleeding F/u TCTS recs      Thank you for allowing pharmacy to be part of this patients care team.  Nicole Cella, [place] Pharmacist [phone] 08/03/2021 9:43 AM  Please check AMION for all [facility] phone numbers After 10:00 PM, call [place]

## 2021-08-03 NOTE — Progress Notes (Signed)
ANTICOAGULATION CONSULT NOTE   Pharmacy Consult for heparin Indication: chest pain/ACS  Allergies  Allergen Reactions   Amoxicillin-Pot Clavulanate Nausea And Vomiting   Ciprofloxacin Hives   Sulfa Antibiotics Other (See Comments)    Trembling, paralyzing pain, red eyes, skin crawling   Latex Itching   Macrolides And Ketolides Diarrhea and Nausea And Vomiting   Tetracyclines & Related Other (See Comments) and Tinitus    Vertigo    Patient Measurements: Height: 5\' 1"  (154.9 cm) Weight: 68.1 kg (150 lb 3.2 oz) (scale A) IBW/kg (Calculated) : 47.8 Heparin Dosing Weight: 63kg  Vital Signs: Temp: 98.3 F (36.8 C) (02/01 1646) Temp Source: Oral (02/01 1646) BP: 104/61 (02/01 1646) Pulse Rate: 66 (02/01 1646)  Labs: Recent Labs    08/01/21 1321 08/01/21 1522 08/01/21 2237 08/01/21 2348 08/02/21 0400 08/03/21 0546 08/03/21 1554  HGB 12.5  --   --   --  13.7 13.0  --   HCT 37.4  --   --   --  40.6 39.1  --   PLT 285  --   --   --  300 272  --   HEPARINUNFRC  --   --   --    < > 0.48 0.11* 0.23*  CREATININE 0.99  --   --   --  0.87  --   --   TROPONINIHS 73* 433* 1,543*  --   --   --   --    < > = values in this interval not displayed.     Estimated Creatinine Clearance: 53.1 mL/min (by C-G formula based on SCr of 0.87 mg/dL).   Medical History: Past Medical History:  Diagnosis Date   Anxiety    Blood in urine    Cervical disc disorder    bulging disc   Diabetes mellitus without complication (HCC)    Dizziness    Falls    Fibromyalgia    H/O Salmonella gastroenteritis    High cholesterol    History of frequent urinary tract infections    History of hiatal hernia    History of vertigo    Hypertension    Imbalance    Kidney stones    Lichen planopilaris    Lumbar herniated disc    L5-L6   Numbness and tingling    hands and feet bilat    Oscillopsia    Peripheral neuropathy    Septicemia (HCC)    2013   Sinusitis    Sjogren's disease (HCC)     Tinnitus    Wears glasses      Assessment: 70 YOF presenting with CP and found to have complex, 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 Pending consult for CABG. No anticoagulation prior to admission. Pharmacy consulted for heparin.     Post cardiac cath 08/02/21 PM.   Heparin level 0.23 (heparin 950 units/hr)   Goal of Therapy:  Heparin level 0.3-0.7 units/ml Monitor platelets by anticoagulation protocol: Yes   Plan:  Increase heparin gtt to 1050 units/h Check HL in ~6 hours Daily heparin level, CBC, s/s bleeding Planning for CABG on Friday  Thank you for allowing pharmacy to be a part of this patients care.  Saturday, PharmD Clinical Pharmacist  Please check AMION for all [facility] Pharmacy numbers After 10:00 PM, call Main Pharmacy [phone]

## 2021-08-03 NOTE — TOC Progression Note (Signed)
Transition of Care [facility]) - Progression Note    Patient Details  Name: Sharifa Bucholz MRN: [id number] Date of Birth: 1950-10-30  Transition of Care [facility]) CM/SW Contact  Leone Haven, RN Phone Number: 08/03/2021, 8:10 AM  Clinical Narrative:      Transition of Care [facility]) Screening Note   Patient Details  Name: Kandace Elrod Date of Birth: 10-Feb-1951   Transition of Care [facility]) CM/SW Contact:    Leone Haven, RN Phone Number: 08/03/2021, 8:10 AM    Transition of Care Department [facility]) has reviewed patient and no TOC needs have been identified at this time. We will continue to monitor patient advancement through interdisciplinary progression rounds. If new patient transition needs arise, please place a TOC consult.         Expected Discharge Plan and Services                                                 Social Determinants of Health (SDOH) Interventions    Readmission Risk Interventions No flowsheet data found.

## 2021-08-03 NOTE — H&P (View-Only) (Signed)
[address]       [place], [address]             [phone]        Tiffany Odonnell [place] Medical Record [id number] Date of Birth: 05-03-1951  Referring: Tiffany Odonnell Primary Care: Tiffany Fiedler, FNP Primary Cardiologist:Tiffany Wells Guiles, MD  Chief Complaint:    Chief Complaint  Patient presents with   Chest Pain    History of Present Illness:      Tiffany Odonnell is a 71 yo female with known history of DM, HLD, HTN, Neuropathy, Sjogrens Syndrome, Lichen planus, anxiety.  The patient also has a history kidney stones with recent procedure performed in January. The patient developed complaints of chest pain on 1/30 in the morning.  The pain was described as a heaviness and burning sensation.  She checked her blood sugar which was normal at 119.  She states the pain did not resolve and she instructed her grand-daughter to call EMS.  On arrival she was hypertensive with SBP of 200.  EKG was also concerning.  She was treated with ASA and NTG which relieved her pain.  The patient states her pain had went to her left shoulder and she was associated with cold sweats/diaphoresis, mild shortness of breath.  Troponin levels were elevated and she was ruled in for NSTEMI.  She was admitted and started on Heparin therapy.  It was felt  she would require coronary catheterization which was performed 08/02/2021 which showed multivessel CAD.  It was felt the patient would require coronary bypass procedure and Cardiothoracic surgery consultation was requested.  The patient is currently chest pain free.  She denies nicotine use.  The patient's diabetes is usually medically managed and her preoperative A1c is 5.9.  The patient does state she has problems with balance and dizziness at times.  Patient is concerned she has a UTI.  Her daughter was on the phone and states this has been a chronic issue.  She has been living in the [place] for the past 3 years.  Current Activity/ Functional  Status: Patient is independent with mobility/ambulation, transfers, ADL's, IADL's.   Zubrod Score: At the time of surgery this patients most appropriate activity status/level should be described as: [x]     0    Normal activity, no symptoms []     1    Restricted in physical strenuous activity but ambulatory, able to do out light work []     2    Ambulatory and capable of self care, unable to do work activities, up and about                 more than 50%  Of the time                            []     3    Only limited self care, in bed greater than 50% of waking hours []     4    Completely disabled, no self care, confined to bed or chair []     5    Moribund  Past Medical History:  Diagnosis Date   Anxiety    Blood in urine    Cervical disc disorder    bulging disc   Diabetes mellitus without complication (HCC)    Dizziness    Falls    Fibromyalgia    H/O Salmonella gastroenteritis    High cholesterol  History of frequent urinary tract infections    History of hiatal hernia    History of vertigo    Hypertension    Imbalance    Kidney stones    Lichen planopilaris    Lumbar herniated disc    L5-L6   Numbness and tingling    hands and feet bilat    Oscillopsia    Peripheral neuropathy    Septicemia ([place])    2013   Sinusitis    Sjogren's disease ([place])    Tinnitus    Wears glasses     Past Surgical History:  Procedure Laterality Date   ABDOMINAL HYSTERECTOMY     2014   IR URETERAL STENT RIGHT NEW ACCESS W/O SEP NEPHROSTOMY CATH  07/15/2021   KNEE ARTHROSCOPY WITH MEDIAL MENISECTOMY Left 10/29/2019   Procedure: KNEE ARTHROSCOPY WITH MEDIAL AND LATERAL MENISECTOMY WITH DEBRIDEMENT;  Surgeon: Susa Day, MD;  Location: [facility];  Service: Orthopedics;  Laterality: Left;  60 mins   LEFT HEART CATH AND CORONARY ANGIOGRAPHY N/A 08/02/2021   Procedure: LEFT HEART CATH AND CORONARY ANGIOGRAPHY;  Surgeon: Jettie Booze, MD;  Location: [facility];  Service:  Cardiovascular;  Laterality: N/A;   NASAL SINUS SURGERY     NEPHROLITHOTOMY Right 05/31/2015   Procedure: RIGHT PERCUTANEOUS NEPHROLITHOTOMY  ;  Surgeon: Kathie Rhodes, MD;  Location: [facility];  Service: Urology;  Laterality: Right;   NEPHROLITHOTOMY Right 07/15/2021   Procedure: NEPHROLITHOTOMY PERCUTANEOUS;  Surgeon: Janith Lima, MD;  Location: [facility];  Service: Urology;  Laterality: Right;   prolapsed bladder and rectocele surgery      TUBAL LIGATION     1983   URETERAL STENT PLACEMENT     times 2 secondary to kidney stone     Social History   Tobacco Use  Smoking Status Never  Smokeless Tobacco Never    Social History   Substance and Sexual Activity  Alcohol Use No     Allergies  Allergen Reactions   Amoxicillin-Pot Clavulanate Nausea And Vomiting   Ciprofloxacin Hives   Sulfa Antibiotics Other (See Comments)    Trembling, paralyzing pain, red eyes, skin crawling   Latex Itching   Macrolides And Ketolides Diarrhea and Nausea And Vomiting   Tetracyclines & Related Other (See Comments) and Tinitus    Vertigo    Current Facility-Administered Medications  Medication Dose Route Frequency Provider Last Rate Last Admin   0.9 %  sodium chloride infusion  250 mL Intravenous PRN Larae Grooms S, MD       0.9 %  sodium chloride infusion  250 mL Intravenous PRN Jettie Booze, MD       acetaminophen (TYLENOL) tablet 650 mg  650 mg Oral Q4H PRN Jettie Booze, MD   650 mg at 08/02/21 2052   ALPRAZolam Duanne Moron) tablet 0.25 mg  0.25 mg Oral BID PRN Jettie Booze, MD       aspirin EC tablet 81 mg  81 mg Oral Daily Jettie Booze, MD   81 mg at 08/03/21 0825   atorvastatin (LIPITOR) tablet 80 mg  80 mg Oral QHS Jettie Booze, MD       carvedilol (COREG) tablet 6.25 mg  6.25 mg Oral BID WC Larae Grooms S, MD   6.25 mg at 08/03/21 0825   cyclobenzaprine (FLEXERIL) tablet 2.5-5 mg  2.5-5 mg Oral TID PRN Jettie Booze, MD        diphenhydrAMINE-zinc acetate (BENADRYL) 2-0.1 % cream  Topical BID PRN Pixie Casino, MD       docusate sodium (COLACE) capsule 100 mg  100 mg Oral Daily PRN Jettie Booze, MD       heparin ADULT infusion 100 units/mL (25000 units/256mL)  950 Units/hr Intravenous Continuous Wendee Beavers, RPH 9.5 mL/hr at 08/03/21 1021 950 Units/hr at 08/03/21 1021   insulin aspart (novoLOG) injection 0-15 Units  0-15 Units Subcutaneous TID WC Pixie Casino, MD   2 Units at 08/03/21 0825   insulin aspart (novoLOG) injection 0-5 Units  0-5 Units Subcutaneous QHS Jettie Booze, MD       magnesium oxide (MAG-OX) tablet 400 mg  400 mg Oral Daily Jettie Booze, MD   400 mg at 08/03/21 0825   nitroGLYCERIN (NITROSTAT) SL tablet 0.4 mg  0.4 mg Sublingual Q5 Min x 3 PRN Jettie Booze, MD       ondansetron [facility]) injection 4 mg  4 mg Intravenous Q6H PRN Jettie Booze, MD       potassium chloride SA (KLOR-CON M) CR tablet 20 mEq  20 mEq Oral Daily Jettie Booze, MD   20 mEq at 08/03/21 0824   sodium chloride flush (NS) 0.9 % injection 3 mL  3 mL Intravenous Q12H Jettie Booze, MD   3 mL at 08/03/21 0827   sodium chloride flush (NS) 0.9 % injection 3 mL  3 mL Intravenous PRN Jettie Booze, MD       sodium chloride flush (NS) 0.9 % injection 3 mL  3 mL Intravenous Q12H Jettie Booze, MD       sodium chloride flush (NS) 0.9 % injection 3 mL  3 mL Intravenous PRN Jettie Booze, MD       zolpidem (AMBIEN) tablet 5 mg  5 mg Oral QHS PRN Jettie Booze, MD        Medications Prior to Admission  Medication Sig Dispense Refill Last Dose   acetaminophen (TYLENOL) 500 MG tablet Take 500 mg by mouth daily as needed for headache (pain).   2 weeks ago   Ascorbic Acid (VITAMIN C) 1000 MG tablet Take 2,000 mg by mouth daily as needed (immune support).   couple days ago   atenolol (TENORMIN) 100 MG tablet Take 100 mg by mouth every morning.  3 08/01/2021 at  1000-1100   betamethasone dipropionate 0.05 % lotion Apply 1 application topically 2 (two) times daily as needed (scalp rash).  2 week ago   Carboxymethylcellulose Sodium (THERATEARS OP) Place 1 drop into both eyes 2 (two) times daily as needed (dry eyes).   week ago   cyclobenzaprine (FLEXERIL) 10 MG tablet Take 2.5-5 mg by mouth 3 (three) times daily as needed for muscle spasms. 1/4 - 1/2 tablet   3 weeks ago   docusate sodium (COLACE) 100 MG capsule Take 1 capsule (100 mg total) by mouth daily as needed for up to 30 doses. (Patient taking differently: Take 100 mg by mouth daily as needed (constipation).) 30 capsule 0 2 weeks ago   ECHINACEA PO Take 2 capsules by mouth daily as needed (immune system boost).   couple days ago   hydrocortisone (ANUSOL-HC) 2.5 % rectal cream Place 1 application rectally 3 (three) times daily as needed for hemorrhoids.   couple days ago   indapamide (LOZOL) 2.5 MG tablet Take 2.5 mg by mouth every morning.   07/31/2021   MAGNESIUM PO Take 1 tablet by mouth at bedtime as needed (relax  nervew).   3 weeks ago   Multiple Vitamins-Minerals (ZINC PO) Take 1 tablet by mouth at bedtime as needed (to relax nerves).   3 weeks ago   Potassium Citrate 15 MEQ (1620 MG) TBCR Take 30 mEq by mouth every morning.   07/31/2021   aspirin EC 81 MG tablet Take 1 tablet (81 mg total) by mouth daily. (Patient not taking: Reported on 08/01/2021) 14 tablet 1 Not Taking   oxyCODONE-acetaminophen (PERCOCET) 5-325 MG tablet Take 1 tablet by mouth every 4 (four) hours as needed for up to 18 doses for severe pain. (Patient not taking: Reported on 08/01/2021) 20 tablet 0 Not Taking    Family History  Problem Relation Age of Onset   CAD Mother    Diabetes Father    Non-Hodgkin's lymphoma Brother    Lupus Other    Hypertension Other    Diabetes Other    Glaucoma Maternal Uncle    Non-Hodgkin's lymphoma Brother    Non-Hodgkin's lymphoma Brother        half brother    Review of Systems:    ROS   Cardiac Review of Systems: Y or  [    ]= no  Chest Pain [ Y, resolved   ]  Resting SOB [   ] Exertional SOB  [  ]  Orthopnea [  ]   Pedal Edema [  N ]    Palpitations Aqua.Slicker  ] Syncope  [  ]   Presyncope [   ]  General Review of Systems: [Y] = yes [  ]=no Constitional: recent weight change [  ]; anorexia [  ]; fatigue [  ]; nausea [  ]; night sweats [  ]; fever [  ]; or chills [  ]                                                               Dental: Last Dentist visit:   Eye : blurred vision [  ]; diplopia [   ]; vision changes [  ];  Amaurosis fugax[  ]; Resp: cough [ N ];  wheezing[  ];  hemoptysis[  ]; shortness of breath[  ]; paroxysmal nocturnal dyspnea[  ]; dyspnea on exertion[  ]; or orthopnea[  ];  GI:  gallstones[  ], vomiting[  ];  dysphagia[  ]; melena[  ];  hematochezia [  ]; heartburn[  ];   Hx of  Colonoscopy[  ]; GU: kidney stones [ Y ]; hematuria[  ];   dysuria [  ];  nocturia[  ];  history of     obstruction [  ]; urinary frequency [  ]             Skin: rash, swelling[ N ];, hair loss[  ];  peripheral edema[  ];  or itching[  ]; Musculosketetal: myalgias[  ];  joint swelling[  ];  joint erythema[  ];  joint pain[  ];  back pain[  ];  Heme/Lymph: bruising[  ];  bleeding[  ];  anemia[  ];  Neuro: TIA[  ];  headaches[  ];  stroke[  ];  vertigo[  ];  seizures[  ];   paresthesias[  ];  difficulty walking[ Y, balance, dizziness problems ];  Psych:depression[ N ]; anxiety[ N ];  Endocrine: diabetes[ Y ];  thyroid dysfunction[  ];        Physical Exam: BP 103/63 (BP Location: Left Arm)    Pulse 64    Temp 98 F (36.7 C) (Oral)    Resp 17    Ht 5\' 1"  (1.549 m)    Wt 68.1 kg Comment: scale A   SpO2 96%    BMI 28.38 kg/m   General appearance: alert, cooperative, and no distress Neck: no adenopathy, no carotid bruit, no JVD, supple, symmetrical, trachea midline, and thyroid not enlarged, symmetric, no tenderness/mass/nodules Resp: clear to auscultation bilaterally Cardio:  regular rate and rhythm GI: soft, non-tender; bowel sounds normal; no masses,  no organomegaly Extremities: extremities normal, atraumatic, no cyanosis or edema, mild telegenctasias Neurologic: Grossly normal,   Diagnostic Studies & Laboratory data:     Recent Radiology Findings:   CARDIAC CATHETERIZATION  Result Date: 08/03/2021   Prox LAD lesion is 75% stenosed.   Prox LAD to Mid LAD lesion is 100% stenosed.  Right to left collaterals fill the mid to distal LAD.   1st Diag lesion is 95% stenosed.   Prox RCA lesion is 40% stenosed.   Mid RCA lesion is 25% stenosed.   The left ventricular systolic function is normal.   LV end diastolic pressure is normal.   The left ventricular ejection fraction is 55-65% by visual estimate.   There is no aortic valve stenosis. 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  Plan for cardiac surgery consult.   DG Chest Portable 1 View  Result Date: 08/01/2021 CLINICAL DATA:  Chest pain EXAM: PORTABLE CHEST 1 VIEW COMPARISON:  07/15/2021 FINDINGS: The heart size and mediastinal contours are within normal limits. Both lungs are clear. The visualized skeletal structures are unremarkable. IMPRESSION: No acute abnormality of the lungs in AP portable projection. Electronically Signed   By: Delanna Ahmadi M.D.   On: 08/01/2021 13:32   ECHOCARDIOGRAM COMPLETE  Result Date: 08/02/2021    ECHOCARDIOGRAM REPORT   Patient Name:   Tiffany Odonnell Date of Exam: 08/02/2021 Medical Rec #:  [id number]     Height:       61.0 in Accession #:    [id number]    Weight:       155.0 lb Date of Birth:  [birth year]     BSA:          1.695 m Patient Age:    19 years      BP:           112/68 mmHg Patient Gender: F             HR:           58 bpm. Exam Location:  Inpatient Procedure: 2D Echo, Color Doppler and Cardiac Doppler Indications:    NSTEMI I21.4  History:        Patient has no prior history of Echocardiogram examinations.                  Risk Factors:Hypertension and Diabetes.  Sonographer:    Bernadene Person RDCS Referring Phys: [id number] [place]  1. Left ventricular ejection fraction, by estimation, is 60 to 65%. The left ventricle has normal function. The left ventricle has no regional wall motion abnormalities. Left ventricular diastolic parameters are consistent with Grade I diastolic dysfunction (impaired relaxation).  2. Right ventricular systolic function is normal. The right ventricular size is normal. There is normal  pulmonary artery systolic pressure.  3. The mitral valve is normal in structure. Mild mitral valve regurgitation. No evidence of mitral stenosis.  4. The aortic valve is tricuspid. There is mild calcification of the aortic valve. Aortic valve regurgitation is not visualized. Aortic valve sclerosis is present, with no evidence of aortic valve stenosis.  5. The inferior vena cava is normal in size with greater than 50% respiratory variability, suggesting right atrial pressure of 3 mmHg. FINDINGS  Left Ventricle: Left ventricular ejection fraction, by estimation, is 60 to 65%. The left ventricle has normal function. The left ventricle has no regional wall motion abnormalities. The left ventricular internal cavity size was normal in size. There is  no left ventricular hypertrophy. Left ventricular diastolic parameters are consistent with Grade I diastolic dysfunction (impaired relaxation). Right Ventricle: The right ventricular size is normal. No increase in right ventricular wall thickness. Right ventricular systolic function is normal. There is normal pulmonary artery systolic pressure. The tricuspid regurgitant velocity is 1.78 m/s, and  with an assumed right atrial pressure of 3 mmHg, the estimated right ventricular systolic pressure is [id number] mmHg. Left Atrium: Left atrial size was normal in size. Right Atrium: Right atrial size was normal in size. Pericardium: There is no evidence of pericardial effusion. Mitral  Valve: The mitral valve is normal in structure. Mild mitral valve regurgitation. No evidence of mitral valve stenosis. Tricuspid Valve: The tricuspid valve is normal in structure. Tricuspid valve regurgitation is mild . No evidence of tricuspid stenosis. Aortic Valve: The aortic valve is tricuspid. There is mild calcification of the aortic valve. Aortic valve regurgitation is not visualized. Aortic valve sclerosis is present, with no evidence of aortic valve stenosis. Pulmonic Valve: The pulmonic valve was normal in structure. Pulmonic valve regurgitation is not visualized. No evidence of pulmonic stenosis. Aorta: The aortic root is normal in size and structure. Venous: The inferior vena cava is normal in size with greater than 50% respiratory variability, suggesting right atrial pressure of 3 mmHg. IAS/Shunts: No atrial level shunt detected by color flow Doppler.  LEFT VENTRICLE PLAX 2D LVIDd:         4.60 cm     Diastology LVIDs:         3.10 cm     LV e' medial:    5.59 cm/s LV PW:         0.90 cm     LV E/e' medial:  12.6 LV IVS:        1.00 cm     LV e' lateral:   7.28 cm/s LVOT diam:     1.80 cm     LV E/e' lateral: 9.7 LV SV:         53 LV SV Index:   31 LVOT Area:     2.54 cm  LV Volumes (MOD) LV vol d, MOD A2C: 81.1 ml LV vol d, MOD A4C: 74.2 ml LV vol s, MOD A2C: 30.5 ml LV vol s, MOD A4C: 28.2 ml LV SV MOD A2C:     50.6 ml LV SV MOD A4C:     74.2 ml LV SV MOD BP:      48.8 ml RIGHT VENTRICLE RV S prime:     9.07 cm/s TAPSE (M-mode): 2.1 cm LEFT ATRIUM           Index        RIGHT ATRIUM           Index LA diam:      3.40  cm 2.01 cm/m   RA Area:     14.70 cm LA Vol (A2C): 31.1 ml 18.35 ml/m  RA Volume:   38.00 ml  22.42 ml/m LA Vol (A4C): 38.1 ml 22.48 ml/m  AORTIC VALVE LVOT Vmax:   87.80 cm/s LVOT Vmean:  59.100 cm/s LVOT VTI:    0.209 m  AORTA Ao Root diam: 3.10 cm Ao Asc diam:  3.40 cm MITRAL VALVE               TRICUSPID VALVE MV Area (PHT): 3.68 cm    TR Peak grad:   12.7 mmHg MV Decel Time:  206 msec    TR Vmax:        178.00 cm/s MV E velocity: 70.70 cm/s MV A velocity: 63.00 cm/s  SHUNTS MV E/A ratio:  1.12        Systemic VTI:  0.21 m                            Systemic Diam: 1.80 cm Candee Furbish MD Electronically signed by Candee Furbish MD Signature Date/Time: 08/02/2021/11:51:16 AM    Final      I have independently reviewed the above radiologic studies and discussed with the patient   Recent Lab Findings: Lab Results  Component Value Date   WBC 7.7 08/03/2021   HGB 13.0 08/03/2021   HCT 39.1 08/03/2021   PLT 272 08/03/2021   GLUCOSE 100 (H) 08/02/2021   CHOL 222 (H) 08/02/2021   TRIG 98 08/02/2021   HDL 56 08/02/2021   LDLCALC 146 (H) 08/02/2021   ALT 21 08/02/2021   AST 32 08/02/2021   NA 138 08/02/2021   K 3.9 08/02/2021   CL 103 08/02/2021   CREATININE 0.87 08/02/2021   BUN 17 08/02/2021   CO2 26 08/02/2021   TSH 0.871 08/01/2021   INR 0.9 07/15/2021   HGBA1C 5.9 (H) 08/01/2021    Assessment / Plan:      CV- CAD, chest pain free... requesting coronary bypass DM- well controlled with diet and exercise, A1c is 5.9 H/O Kidney stones.. most recent treatment was in January HLD HTN Sjogren's syndrome Lichen planopilaris UA/Culture to ensure no active UTI  Patient is currently chest pain free. Tentatively scheduled for CABG on Friday with Dr. Cyndia Bent.. he will evaluate and follow up with further recommendations  I  spent 55 minutes counseling the patient face to face.  Ellwood Handler, PA-C 08/03/2021 11:08 AM    Chart reviewed, patient examined, agree with above. The patient is a 71 year old woman with history of diabetes, hypertension, hyperlipidemia who reports starting an exercise program over the past few months to try to get her newly diagnosed diabetes under control.  She was walking on a treadmill and developed some intermittent chest discomfort.  She was also having some exertional fatigue.  Then in January she developed a large right kidney stone and  had to undergo right percutaneous nephrostomy tube placement with stenting of her ureter and stopped exercising after that.  She also had undergo banding of some internal hemorrhoids.  She was now admitted with substernal chest pain that she described as a burning sensation with heaviness.  She ruled in for non-ST segment elevation MI.  Cardiac catheterization shows severe two-vessel coronary disease with a complex calcified proximal and mid LAD chronic total occlusion with faint filling of the distal vessel by collaterals from the right.  There was a large diagonal branch that had  high-grade proximal stenosis.  The left circumflex had no significant disease.  The right coronary artery was diffusely diseased with calcific plaque and had a 40 to 50% shelflike proximal stenosis.  Left ventricular ejection fraction was normal by echocardiogram with no significant valvular abnormality.  I agree that coronary bypass graft surgery is the best treatment for her to improve her symptoms and prevent further myocardial damage.  She had a urinalysis since admission that showed large leukocytes and many bacteria.  Culture is growing greater than 100,000 E. coli.  Sensitivities are pending.  She remains afebrile with normal white blood cell count and denies any urinary symptoms at this time.  I suspect this is probably colonization and will be covered by her perioperative antibiotics.  Given the high-grade nature of her coronary disease I think would be best to proceed ahead with coronary bypass surgery. I discussed the operative procedure with the patient including alternatives, benefits and risks; including but not limited to bleeding, blood transfusion, infection, stroke, myocardial infarction, graft failure, heart block requiring a permanent pacemaker, organ dysfunction, and death.  Tiffany Odonnell understands and agrees to proceed.  We will schedule surgery for tomorrow morning.

## 2021-08-03 NOTE — Progress Notes (Signed)
DAILY PROGRESS NOTE   Patient Name: Tiffany Odonnell Date of Encounter: 08/03/2021 Cardiologist: Pixie Casino, MD  Chief Complaint   No chest pain  Patient Profile   Tiffany Odonnell is a 71 y.o. female originally from [place], with a hx of DM, HLD, HTN, neuropathy, Sjogren's dz, anxiety, who is being seen 08/01/2021 for the evaluation of chest pain at the request of Dr Mariane Masters.  Subjective   No further chest pain overnight. 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 CT surgery consult pending. Echo yesterday showed normal LV function with grade 1 DD and mild MR.  Objective   Vitals:   08/03/21 0000 08/03/21 0558 08/03/21 0604 08/03/21 0737  BP: 96/62  103/80 109/71  Pulse: 64  65 62  Resp: 15  18 18   Temp: 98.7 F (37.1 C)  97.7 F (36.5 C)   TempSrc: Oral  Oral Oral  SpO2: 97%  97% 96%  Weight: 68.7 kg 68.1 kg    Height:        Intake/Output Summary (Last 24 hours) at 08/03/2021 0954 Last data filed at 08/03/2021 0908 Gross per 24 hour  Intake 401.69 ml  Output 550 ml  Net -148.31 ml   Filed Weights   08/02/21 2157 08/03/21 0000 08/03/21 0558  Weight: 68.7 kg 68.7 kg 68.1 kg    Physical Exam   General appearance: alert and no distress Lungs: clear to auscultation bilaterally Heart: regular rate and rhythm, S1, S2 normal, no murmur, click, rub or gallop Extremities: extremities normal, atraumatic, no cyanosis or edema Neurologic: Grossly normal  Inpatient Medications    Scheduled Meds:  aspirin EC  81 mg Oral Daily   atorvastatin  80 mg Oral QHS   carvedilol  6.25 mg Oral BID WC   insulin aspart  0-15 Units Subcutaneous TID WC   insulin aspart  0-5 Units Subcutaneous QHS   magnesium oxide  400 mg Oral Daily   potassium chloride  20 mEq Oral Daily   sodium chloride flush  3 mL Intravenous Q12H   sodium chloride flush  3 mL Intravenous  Q12H    Continuous Infusions:  sodium chloride     sodium chloride     heparin 800 Units/hr (08/03/21 0541)    PRN Meds: sodium chloride, sodium chloride, acetaminophen, ALPRAZolam, cyclobenzaprine, diphenhydrAMINE-zinc acetate, docusate sodium, nitroGLYCERIN, ondansetron (ZOFRAN) IV, sodium chloride flush, sodium chloride flush, zolpidem   Labs   Results for orders placed or performed during the hospital encounter of 08/01/21 (from the past 48 hour(s))  Basic metabolic panel     Status: Abnormal   Collection Time: 08/01/21  1:21 PM  Result Value Ref Range   Sodium 134 (L) 135 - 145 mmol/L   Potassium 3.2 (L) 3.5 - 5.1 mmol/L   Chloride 99 98 - 111 mmol/L   CO2 23 22 - 32 mmol/L   Glucose, Bld 134 (H) 70 - 99 mg/dL    Comment: Glucose reference range applies only to samples taken after fasting for at least 8 hours.   BUN 22 8 - 23 mg/dL   Creatinine, Ser 0.99 0.44 - 1.00 mg/dL   Calcium 9.1 8.9 - 10.3 mg/dL   GFR, Estimated >60 >60 mL/min    Comment: (NOTE) Calculated using the CKD-EPI Creatinine Equation (2021)    Anion gap 12 5 - 15    Comment: Performed at [facility], [address] [place], [place] [address]  CBC     Status: None   Collection Time: 08/01/21  1:21 PM  Result Value Ref Range   WBC 7.0 4.0 - 10.5 K/uL   RBC 4.00 3.87 - 5.11 MIL/uL   Hemoglobin 12.5 12.0 - 15.0 g/dL   HCT 37.4 36.0 - 46.0 %   MCV 93.5 80.0 - 100.0 fL   MCH 31.3 26.0 - 34.0 pg   MCHC 33.4 30.0 - 36.0 g/dL   RDW 12.1 11.5 - 15.5 %   Platelets 285 150 - 400 K/uL   nRBC 0.0 0.0 - 0.2 %    Comment: Performed at [facility], [place] [address] [place], [place] [address]  Troponin I (High Sensitivity)     Status: Abnormal   Collection Time: 08/01/21  1:21 PM  Result Value Ref Range   Troponin I (High Sensitivity) 73 (H) <18 ng/L    Comment: (NOTE) Elevated high sensitivity troponin I (hsTnI) values and significant  changes across serial measurements may suggest ACS but many  other  chronic and acute conditions are known to elevate hsTnI results.  Refer to the "Links" section for chest pain algorithms and additional  guidance. Performed at [facility], [place] [address] [place], [place] [address]   Magnesium     Status: None   Collection Time: 08/01/21  1:21 PM  Result Value Ref Range   Magnesium 1.7 1.7 - 2.4 mg/dL    Comment: Performed at [facility] [address], [place], [place] [address]  Resp Panel by RT-PCR (Flu A&B, Covid) Nasopharyngeal Swab     Status: None   Collection Time: 08/01/21  2:40 PM   Specimen: Nasopharyngeal Swab; Nasopharyngeal(NP) swabs in vial transport medium  Result Value Ref Range   SARS Coronavirus 2 by RT PCR NEGATIVE NEGATIVE    Comment: (NOTE) SARS-CoV-2 target nucleic acids are NOT DETECTED.  The SARS-CoV-2 RNA is generally detectable in upper respiratory specimens during the acute phase of infection. The lowest concentration of SARS-CoV-2 viral copies this assay can detect is 138 copies/mL. A negative result does not preclude SARS-Cov-2 infection and should not be used as the sole basis for treatment or other patient management decisions. A negative result may occur with  improper specimen collection/handling, submission of specimen other than nasopharyngeal swab, presence of viral mutation(s) within the areas targeted by this assay, and inadequate number of viral copies(<138 copies/mL). A negative result must be combined with clinical observations, patient history, and epidemiological information. The expected result is Negative.  Fact Sheet for Patients:  EntrepreneurPulse.com.au  Fact Sheet for Healthcare Providers:  IncredibleEmployment.be  This test is no t yet approved or cleared by the [place] FDA and  has been authorized for detection and/or diagnosis of SARS-CoV-2 by FDA under an Emergency Use Authorization (EUA). This EUA will remain  in effect  (meaning this test can be used) for the duration of the COVID-19 declaration under Section 564(b)(1) of the Act, 21 U.S.C.section 360bbb-3(b)(1), unless the authorization is terminated  or revoked sooner.       Influenza A by PCR NEGATIVE NEGATIVE   Influenza B by PCR NEGATIVE NEGATIVE    Comment: (NOTE) The Xpert Xpress SARS-CoV-2/FLU/RSV plus assay is intended as an aid in the diagnosis of influenza from Nasopharyngeal swab specimens and should not be used as a sole basis for treatment. Nasal washings and aspirates are unacceptable for Xpert Xpress SARS-CoV-2/FLU/RSV testing.  Fact Sheet  for Patients: EntrepreneurPulse.com.au  Fact Sheet for Healthcare Providers: IncredibleEmployment.be  This test is not yet approved or cleared by the [place] FDA and has been authorized for detection and/or diagnosis of SARS-CoV-2 by FDA under an Emergency Use Authorization (EUA). This EUA will remain in effect (meaning this test can be used) for the duration of the COVID-19 declaration under Section 564(b)(1) of the Act, 21 U.S.C. section 360bbb-3(b)(1), unless the authorization is terminated or revoked.  Performed at [facility], [place] [address] [place], [place] [address]   Troponin I (High Sensitivity)     Status: Abnormal   Collection Time: 08/01/21  3:22 PM  Result Value Ref Range   Troponin I (High Sensitivity) 433 (HH) <18 ng/L    Comment: CRITICAL RESULT CALLED TO, READ BACK BY AND VERIFIED WITH: A BANK,RN 1625 08/01/2021 WBOND (NOTE) Elevated high sensitivity troponin I (hsTnI) values and significant  changes across serial measurements may suggest ACS but many other  chronic and acute conditions are known to elevate hsTnI results.  Refer to the Links section for chest pain algorithms and additional  guidance. Performed at [facility], [place] [address] [place], [place] [address]   HIV Antibody (routine testing w rflx)      Status: None   Collection Time: 08/01/21 10:37 PM  Result Value Ref Range   HIV Screen 4th Generation wRfx Non Reactive Non Reactive    Comment: Performed at [facility], [place] [address], [place], [place] [address]  TSH     Status: None   Collection Time: 08/01/21 10:37 PM  Result Value Ref Range   TSH 0.871 0.350 - 4.500 uIU/mL    Comment: Performed by a 3rd Generation assay with a functional sensitivity of <=0.01 uIU/mL. Performed at [facility], [place] [address], [place], [place] [address]   Troponin I (High Sensitivity)     Status: Abnormal   Collection Time: 08/01/21 10:37 PM  Result Value Ref Range   Troponin I (High Sensitivity) 1,543 (HH) <18 ng/L    Comment: CRITICAL VALUE NOTED.  VALUE IS CONSISTENT WITH PREVIOUSLY REPORTED AND CALLED VALUE. (NOTE) Elevated high sensitivity troponin I (hsTnI) values and significant  changes across serial measurements may suggest ACS but many other  chronic and acute conditions are known to elevate hsTnI results.  Refer to the Links section for chest pain algorithms and additional  guidance. Performed at [facility], [place] [address], [place], [place] [address]   Hemoglobin A1c     Status: Abnormal   Collection Time: 08/01/21 10:37 PM  Result Value Ref Range   Hgb A1c MFr Bld 5.9 (H) 4.8 - 5.6 %    Comment: (NOTE) Pre diabetes:          5.7%-6.4%  Diabetes:              >6.4%  Glycemic control for   <7.0% adults with diabetes    Mean Plasma Glucose 122.63 mg/dL    Comment: Performed at [facility] [address], [place], [place] [address]  Heparin level (unfractionated)     Status: None   Collection Time: 08/01/21 11:48 PM  Result Value Ref Range   Heparin Unfractionated 0.42 0.30 - 0.70 IU/mL    Comment: (NOTE) The clinical reportable range upper limit is being lowered to >1.10 to align with the FDA approved guidance for the current laboratory assay.  If heparin results are below expected values, and  patient  dosage has  been confirmed, suggest follow up testing of antithrombin III levels. Performed at [facility], [place] [address] [place], [place] [address]   Heparin level (unfractionated)     Status: None   Collection Time: 08/02/21  4:00 AM  Result Value Ref Range   Heparin Unfractionated 0.48 0.30 - 0.70 IU/mL    Comment: (NOTE) The clinical reportable range upper limit is being lowered to >1.10 to align with the FDA approved guidance for the current laboratory assay.  If heparin results are below expected values, and patient dosage has  been confirmed, suggest follow up testing of antithrombin III levels. Performed at [facility], [place] [address], [place], [place] [address]   CBC     Status: None   Collection Time: 08/02/21  4:00 AM  Result Value Ref Range   WBC 8.0 4.0 - 10.5 K/uL   RBC 4.34 3.87 - 5.11 MIL/uL   Hemoglobin 13.7 12.0 - 15.0 g/dL   HCT 40.6 36.0 - 46.0 %   MCV 93.5 80.0 - 100.0 fL   MCH 31.6 26.0 - 34.0 pg   MCHC 33.7 30.0 - 36.0 g/dL   RDW 12.2 11.5 - 15.5 %   Platelets 300 150 - 400 K/uL   nRBC 0.0 0.0 - 0.2 %    Comment: Performed at [facility], [place] [address] [place], [place] [address]  Comprehensive metabolic panel     Status: Abnormal   Collection Time: 08/02/21  4:00 AM  Result Value Ref Range   Sodium 138 135 - 145 mmol/L   Potassium 3.9 3.5 - 5.1 mmol/L   Chloride 103 98 - 111 mmol/L   CO2 26 22 - 32 mmol/L   Glucose, Bld 100 (H) 70 - 99 mg/dL    Comment: Glucose reference range applies only to samples taken after fasting for at least 8 hours.   BUN 17 8 - 23 mg/dL   Creatinine, Ser 0.87 0.44 - 1.00 mg/dL   Calcium 9.5 8.9 - 10.3 mg/dL   Total Protein 7.7 6.5 - 8.1 g/dL   Albumin 3.6 3.5 - 5.0 g/dL   AST 32 15 - 41 U/L   ALT 21 0 - 44 U/L   Alkaline Phosphatase 62 38 - 126 U/L   Total Bilirubin 0.9 0.3 - 1.2 mg/dL   GFR, Estimated >60 >60 mL/min    Comment: (NOTE) Calculated using the CKD-EPI Creatinine  Equation (2021)    Anion gap 9 5 - 15    Comment: Performed at [facility] [address] [place], [place] [address]  Lipid panel     Status: Abnormal   Collection Time: 08/02/21  4:00 AM  Result Value Ref Range   Cholesterol 222 (H) 0 - 200 mg/dL   Triglycerides 98 <150 mg/dL   HDL 56 >40 mg/dL   Total CHOL/HDL Ratio 4.0 RATIO   VLDL 20 0 - 40 mg/dL   LDL Cholesterol 146 (H) 0 - 99 mg/dL    Comment:        Total Cholesterol/HDL:CHD Risk Coronary Heart Disease Risk Table                     Men   Women  1/2 Average Risk   3.4   3.3  Average Risk       5.0   4.4  2 X Average Risk   9.6   7.1  3 X Average Risk  23.4  11.0        Use the calculated Patient Ratio above and the CHD Risk Table to determine the patient's CHD Risk.        ATP III CLASSIFICATION (LDL):  <100     mg/dL   Optimal  100-129  mg/dL   Near or Above                    Optimal  130-159  mg/dL   Borderline  160-189  mg/dL   High  >190     mg/dL   Very High Performed at [facility] [address] [place], [place] [address]   CBG monitoring, ED     Status: Abnormal   Collection Time: 08/02/21  8:23 AM  Result Value Ref Range   Glucose-Capillary 103 (H) 70 - 99 mg/dL    Comment: Glucose reference range applies only to samples taken after fasting for at least 8 hours.  CBG monitoring, ED     Status: None   Collection Time: 08/02/21  2:00 PM  Result Value Ref Range   Glucose-Capillary 84 70 - 99 mg/dL    Comment: Glucose reference range applies only to samples taken after fasting for at least 8 hours.  Glucose, capillary     Status: None   Collection Time: 08/02/21  4:30 PM  Result Value Ref Range   Glucose-Capillary 95 70 - 99 mg/dL    Comment: Glucose reference range applies only to samples taken after fasting for at least 8 hours.  Glucose, capillary     Status: Abnormal   Collection Time: 08/02/21 10:08 PM  Result Value Ref Range   Glucose-Capillary 169 (H) 70 - 99 mg/dL     Comment: Glucose reference range applies only to samples taken after fasting for at least 8 hours.   Comment 1 Notify RN    Comment 2 Document in Chart   Heparin level (unfractionated)     Status: Abnormal   Collection Time: 08/03/21  5:46 AM  Result Value Ref Range   Heparin Unfractionated 0.11 (L) 0.30 - 0.70 IU/mL    Comment: (NOTE) The clinical reportable range upper limit is being lowered to >1.10 to align with the FDA approved guidance for the current laboratory assay.  If heparin results are below expected values, and patient dosage has  been confirmed, suggest follow up testing of antithrombin III levels. Performed at [facility], [place] [address] [place], [place] [address]   CBC     Status: None   Collection Time: 08/03/21  5:46 AM  Result Value Ref Range   WBC 7.7 4.0 - 10.5 K/uL   RBC 4.20 3.87 - 5.11 MIL/uL   Hemoglobin 13.0 12.0 - 15.0 g/dL   HCT 39.1 36.0 - 46.0 %   MCV 93.1 80.0 - 100.0 fL   MCH 31.0 26.0 - 34.0 pg   MCHC 33.2 30.0 - 36.0 g/dL   RDW 12.4 11.5 - 15.5 %   Platelets 272 150 - 400 K/uL   nRBC 0.0 0.0 - 0.2 %    Comment: Performed at [facility], [place] [address], [place], [place] [address]    ECG   N/A - Personally Reviewed  Telemetry   Sinus bradycardia- Personally Reviewed  Radiology    CARDIAC CATHETERIZATION  Result Date: 08/03/2021   Prox LAD lesion is 75% stenosed.   Prox LAD to Mid LAD lesion is 100% stenosed.  Right to left collaterals fill the  mid to distal LAD.   1st Diag lesion is 95% stenosed.   Prox RCA lesion is 40% stenosed.   Mid RCA lesion is 25% stenosed.   The left ventricular systolic function is normal.   LV end diastolic pressure is normal.   The left ventricular ejection fraction is 55-65% by visual estimate.   There is no aortic valve stenosis. 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  Plan for cardiac surgery consult.   DG Chest Portable 1  View  Result Date: 08/01/2021 CLINICAL DATA:  Chest pain EXAM: PORTABLE CHEST 1 VIEW COMPARISON:  07/15/2021 FINDINGS: The heart size and mediastinal contours are within normal limits. Both lungs are clear. The visualized skeletal structures are unremarkable. IMPRESSION: No acute abnormality of the lungs in AP portable projection. Electronically Signed   By: Delanna Ahmadi M.D.   On: 08/01/2021 13:32   ECHOCARDIOGRAM COMPLETE  Result Date: 08/02/2021    ECHOCARDIOGRAM REPORT   Patient Name:   Tiffany Odonnell Date of Exam: 08/02/2021 Medical Rec #:  [id number]     Height:       61.0 in Accession #:    [id number]    Weight:       155.0 lb Date of Birth:  February 23, 1951     BSA:          1.695 m Patient Age:    64 years      BP:           112/68 mmHg Patient Gender: F             HR:           58 bpm. Exam Location:  Inpatient Procedure: 2D Echo, Color Doppler and Cardiac Doppler Indications:    NSTEMI I21.4  History:        Patient has no prior history of Echocardiogram examinations.                 Risk Factors:Hypertension and Diabetes.  Sonographer:    Bernadene Person RDCS Referring Phys: 53 [place]  1. Left ventricular ejection fraction, by estimation, is 60 to 65%. The left ventricle has normal function. The left ventricle has no regional wall motion abnormalities. Left ventricular diastolic parameters are consistent with Grade I diastolic dysfunction (impaired relaxation).  2. Right ventricular systolic function is normal. The right ventricular size is normal. There is normal pulmonary artery systolic pressure.  3. The mitral valve is normal in structure. Mild mitral valve regurgitation. No evidence of mitral stenosis.  4. The aortic valve is tricuspid. There is mild calcification of the aortic valve. Aortic valve regurgitation is not visualized. Aortic valve sclerosis is present, with no evidence of aortic valve stenosis.  5. The inferior vena cava is normal in size with greater than 50%  respiratory variability, suggesting right atrial pressure of 3 mmHg. FINDINGS  Left Ventricle: Left ventricular ejection fraction, by estimation, is 60 to 65%. The left ventricle has normal function. The left ventricle has no regional wall motion abnormalities. The left ventricular internal cavity size was normal in size. There is  no left ventricular hypertrophy. Left ventricular diastolic parameters are consistent with Grade I diastolic dysfunction (impaired relaxation). Right Ventricle: The right ventricular size is normal. No increase in right ventricular wall thickness. Right ventricular systolic function is normal. There is normal pulmonary artery systolic pressure. The tricuspid regurgitant velocity is 1.78 m/s, and  with an assumed right atrial pressure  of 3 mmHg, the estimated right ventricular systolic pressure is [id number] mmHg. Left Atrium: Left atrial size was normal in size. Right Atrium: Right atrial size was normal in size. Pericardium: There is no evidence of pericardial effusion. Mitral Valve: The mitral valve is normal in structure. Mild mitral valve regurgitation. No evidence of mitral valve stenosis. Tricuspid Valve: The tricuspid valve is normal in structure. Tricuspid valve regurgitation is mild . No evidence of tricuspid stenosis. Aortic Valve: The aortic valve is tricuspid. There is mild calcification of the aortic valve. Aortic valve regurgitation is not visualized. Aortic valve sclerosis is present, with no evidence of aortic valve stenosis. Pulmonic Valve: The pulmonic valve was normal in structure. Pulmonic valve regurgitation is not visualized. No evidence of pulmonic stenosis. Aorta: The aortic root is normal in size and structure. Venous: The inferior vena cava is normal in size with greater than 50% respiratory variability, suggesting right atrial pressure of 3 mmHg. IAS/Shunts: No atrial level shunt detected by color flow Doppler.  LEFT VENTRICLE PLAX 2D LVIDd:         4.60 cm      Diastology LVIDs:         3.10 cm     LV e' medial:    5.59 cm/s LV PW:         0.90 cm     LV E/e' medial:  12.6 LV IVS:        1.00 cm     LV e' lateral:   7.28 cm/s LVOT diam:     1.80 cm     LV E/e' lateral: 9.7 LV SV:         53 LV SV Index:   31 LVOT Area:     2.54 cm  LV Volumes (MOD) LV vol d, MOD A2C: 81.1 ml LV vol d, MOD A4C: 74.2 ml LV vol s, MOD A2C: 30.5 ml LV vol s, MOD A4C: 28.2 ml LV SV MOD A2C:     50.6 ml LV SV MOD A4C:     74.2 ml LV SV MOD BP:      48.8 ml RIGHT VENTRICLE RV S prime:     9.07 cm/s TAPSE (M-mode): 2.1 cm LEFT ATRIUM           Index        RIGHT ATRIUM           Index LA diam:      3.40 cm 2.01 cm/m   RA Area:     14.70 cm LA Vol (A2C): 31.1 ml 18.35 ml/m  RA Volume:   38.00 ml  22.42 ml/m LA Vol (A4C): 38.1 ml 22.48 ml/m  AORTIC VALVE LVOT Vmax:   87.80 cm/s LVOT Vmean:  59.100 cm/s LVOT VTI:    0.209 m  AORTA Ao Root diam: 3.10 cm Ao Asc diam:  3.40 cm MITRAL VALVE               TRICUSPID VALVE MV Area (PHT): 3.68 cm    TR Peak grad:   12.7 mmHg MV Decel Time: 206 msec    TR Vmax:        178.00 cm/s MV E velocity: 70.70 cm/s MV A velocity: 63.00 cm/s  SHUNTS MV E/A ratio:  1.12        Systemic VTI:  0.21 m                            Systemic  Diam: 1.80 cm Candee Furbish MD Electronically signed by Candee Furbish MD Signature Date/Time: 08/02/2021/11:51:16 AM    Final     Cardiac Studies   Above  Assessment   Principal Problem:   NSTEMI (non-ST elevated myocardial infarction) [facility])   Plan   Tiffany Odonnell was found to have 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 Consult is pending. She is chest pain free. Echo is reassuring with normal LV function.  Time Spent Directly with Patient:  I have spent a total of 25 minutes with the patient reviewing hospital notes, telemetry, EKGs, labs and examining the patient as well as establishing an assessment and plan that was  discussed personally with the patient.  > 50% of time was spent in direct patient care.  Length of Stay:  LOS: 2 days   Pixie Casino, MD, [facility], [place] Director of the Advanced Lipid Disorders &  Cardiovascular Risk Reduction Clinic Diplomate of the American Board of Clinical Lipidology Attending Cardiologist  Direct Dial: [phone]   Fax: [phone]  Website:  [email or web address] 08/03/2021, 9:54 AM

## 2021-08-04 DIAGNOSIS — I214 Non-ST elevation (NSTEMI) myocardial infarction: Secondary | ICD-10-CM | POA: Diagnosis not present

## 2021-08-04 LAB — GLUCOSE, CAPILLARY
Glucose-Capillary: 117 mg/dL — ABNORMAL HIGH (ref 70–99)
Glucose-Capillary: 116 mg/dL — ABNORMAL HIGH (ref 70–99)
Glucose-Capillary: 154 mg/dL — ABNORMAL HIGH (ref 70–99)
Glucose-Capillary: 146 mg/dL — ABNORMAL HIGH (ref 70–99)

## 2021-08-04 LAB — CBC
HCT: 39.2 % (ref 36.0–46.0)
Hemoglobin: 13.3 g/dL (ref 12.0–15.0)
MCH: 31.7 pg (ref 26.0–34.0)
MCHC: 33.9 g/dL (ref 30.0–36.0)
MCV: 93.6 fL (ref 80.0–100.0)
Platelets: 288 10*3/uL (ref 150–400)
RBC: 4.19 MIL/uL (ref 3.87–5.11)
RDW: 12.4 % (ref 11.5–15.5)
WBC: 7.7 10*3/uL (ref 4.0–10.5)
nRBC: 0 % (ref 0.0–0.2)

## 2021-08-04 LAB — SURGICAL PCR SCREEN
MRSA, PCR: NEGATIVE
Staphylococcus aureus: NEGATIVE

## 2021-08-04 LAB — BASIC METABOLIC PANEL
Anion gap: 10 (ref 5–15)
BUN: 17 mg/dL (ref 8–23)
CO2: 24 mmol/L (ref 22–32)
Calcium: 9.3 mg/dL (ref 8.9–10.3)
Chloride: 102 mmol/L (ref 98–111)
Creatinine, Ser: 0.9 mg/dL (ref 0.44–1.00)
GFR, Estimated: >60 mL/min (ref >60)
Glucose, Bld: 118 mg/dL — ABNORMAL HIGH (ref 70–99)
Potassium: 4.4 mmol/L (ref 3.5–5.1)
Sodium: 136 mmol/L (ref 135–145)

## 2021-08-04 LAB — OCCULT BLOOD X 1 CARD TO LAB, STOOL: Fecal Occult Bld: NEGATIVE

## 2021-08-04 LAB — ABO/RH: ABO/RH(D): O POS

## 2021-08-04 LAB — HEPARIN LEVEL (UNFRACTIONATED): Heparin Unfractionated: 0.41 IU/mL (ref 0.30–0.70)

## 2021-08-04 MED ORDER — TEMAZEPAM 15 MG PO CAPS
15.0000 mg | ORAL_CAPSULE | Freq: Once | ORAL | Status: AC | PRN
  Administered 2021-08-04: 15 mg via ORAL
  Filled 2021-08-04: qty 1

## 2021-08-04 MED ORDER — TRANEXAMIC ACID 1000 MG/10ML IV SOLN
1.5000 mg/kg/h | INTRAVENOUS | Status: AC
  Administered 2021-08-05: 1.5 mg/kg/h via INTRAVENOUS
  Filled 2021-08-04: qty 25

## 2021-08-04 MED ORDER — CHLORHEXIDINE GLUCONATE 0.12 % MT SOLN
15.0000 mL | Freq: Once | OROMUCOSAL | Status: AC
  Administered 2021-08-05: 15 mL via OROMUCOSAL
  Filled 2021-08-04: qty 15

## 2021-08-04 MED ORDER — CHLORHEXIDINE GLUCONATE CLOTH 2 % EX PADS
6.0000 | MEDICATED_PAD | Freq: Once | CUTANEOUS | Status: AC
  Administered 2021-08-05: 6 via TOPICAL

## 2021-08-04 MED ORDER — DEXMEDETOMIDINE HCL IN NACL 400 MCG/100ML IV SOLN
0.1000 ug/kg/h | INTRAVENOUS | Status: AC
  Administered 2021-08-05: .7 ug/kg/h via INTRAVENOUS
  Filled 2021-08-04: qty 100

## 2021-08-04 MED ORDER — PHENYLEPHRINE HCL-NACL 20-0.9 MG/250ML-% IV SOLN
30.0000 ug/min | INTRAVENOUS | Status: AC
  Administered 2021-08-05: 20 ug/min via INTRAVENOUS
  Filled 2021-08-04: qty 250

## 2021-08-04 MED ORDER — POTASSIUM CHLORIDE 2 MEQ/ML IV SOLN
80.0000 meq | INTRAVENOUS | Status: DC
  Filled 2021-08-04: qty 40

## 2021-08-04 MED ORDER — DIAZEPAM 2 MG PO TABS
2.0000 mg | ORAL_TABLET | Freq: Once | ORAL | Status: AC
  Administered 2021-08-05: 2 mg via ORAL
  Filled 2021-08-04: qty 1

## 2021-08-04 MED ORDER — CEFAZOLIN SODIUM-DEXTROSE 2-4 GM/100ML-% IV SOLN
2.0000 g | INTRAVENOUS | Status: AC
  Administered 2021-08-05 (×2): 2 g via INTRAVENOUS
  Filled 2021-08-04: qty 100

## 2021-08-04 MED ORDER — INSULIN REGULAR(HUMAN) IN NACL 100-0.9 UT/100ML-% IV SOLN
INTRAVENOUS | Status: AC
  Administered 2021-08-05: 2.8 [IU]/h via INTRAVENOUS
  Filled 2021-08-04: qty 100

## 2021-08-04 MED ORDER — TRANEXAMIC ACID (OHS) BOLUS VIA INFUSION
15.0000 mg/kg | INTRAVENOUS | Status: AC
  Administered 2021-08-05: 1023 mg via INTRAVENOUS
  Filled 2021-08-04: qty 1023

## 2021-08-04 MED ORDER — EPINEPHRINE HCL 5 MG/250ML IV SOLN IN NS
0.0000 ug/min | INTRAVENOUS | Status: DC
  Filled 2021-08-04: qty 250

## 2021-08-04 MED ORDER — HEPARIN 30,000 UNITS/1000 ML (OHS) CELLSAVER SOLUTION
Status: DC
  Filled 2021-08-04: qty 1000

## 2021-08-04 MED ORDER — CEFAZOLIN SODIUM-DEXTROSE 2-4 GM/100ML-% IV SOLN
2.0000 g | INTRAVENOUS | Status: DC
  Filled 2021-08-04: qty 100

## 2021-08-04 MED ORDER — MILRINONE LACTATE IN DEXTROSE 20-5 MG/100ML-% IV SOLN
0.3000 ug/kg/min | INTRAVENOUS | Status: DC
  Filled 2021-08-04: qty 100

## 2021-08-04 MED ORDER — PLASMA-LYTE A IV SOLN
INTRAVENOUS | Status: DC
  Filled 2021-08-04: qty 2.5

## 2021-08-04 MED ORDER — BISACODYL 5 MG PO TBEC
5.0000 mg | DELAYED_RELEASE_TABLET | Freq: Once | ORAL | Status: AC
  Administered 2021-08-04: 5 mg via ORAL
  Filled 2021-08-04: qty 1

## 2021-08-04 MED ORDER — NITROGLYCERIN IN D5W 200-5 MCG/ML-% IV SOLN
2.0000 ug/min | INTRAVENOUS | Status: DC
  Filled 2021-08-04: qty 250

## 2021-08-04 MED ORDER — NOREPINEPHRINE 4 MG/250ML-% IV SOLN
0.0000 ug/min | INTRAVENOUS | Status: DC
  Filled 2021-08-04: qty 250

## 2021-08-04 MED ORDER — TRANEXAMIC ACID (OHS) PUMP PRIME SOLUTION
2.0000 mg/kg | INTRAVENOUS | Status: DC
  Filled 2021-08-04: qty 1.36

## 2021-08-04 MED ORDER — VANCOMYCIN HCL 1250 MG/250ML IV SOLN
1250.0000 mg | INTRAVENOUS | Status: AC
  Administered 2021-08-05: 1250 mg via INTRAVENOUS
  Filled 2021-08-04: qty 250

## 2021-08-04 MED ORDER — METOPROLOL TARTRATE 12.5 MG HALF TABLET
12.5000 mg | ORAL_TABLET | Freq: Once | ORAL | Status: AC
  Administered 2021-08-05: 12.5 mg via ORAL
  Filled 2021-08-04: qty 1

## 2021-08-04 MED ORDER — MAGNESIUM SULFATE 50 % IJ SOLN
40.0000 meq | INTRAMUSCULAR | Status: DC
  Filled 2021-08-04: qty 9.85

## 2021-08-04 NOTE — Progress Notes (Signed)
CARDIAC REHAB PHASE I   PRE:  Rate/Rhythm: 72 SR    BP: sitting 106/68    SaO2:   MODE:  Ambulation: 470 ft   POST:  Rate/Rhythm: 86 SR    BP: sitting 112/78     SaO2:   Tolerated well, no c/o. Likes to hold to IV pole. Discussed IS, sternal precautions, mobility post op, and d/c planning. Receptive. Lives with her family although eager to get back to [facility] IS to be delivered later today. Gave ed materials to view. [phone]   Harriet Masson CES, ACSM 08/04/2021 12:55 PM

## 2021-08-04 NOTE — Progress Notes (Addendum)
Pt again c/o itching and rash to chest, back, and arms. Benadryl cream applied to pt by RN. Laundry called again for hypoallergenic or cotton bedding/ gown.

## 2021-08-04 NOTE — Progress Notes (Signed)
RN collected and sent surgical PCR to lab.

## 2021-08-04 NOTE — Progress Notes (Signed)
DAILY PROGRESS NOTE   Patient Name: Tiffany Odonnell Date of Encounter: 08/04/2021 Cardiologist: Chrystie Nose, MD  Chief Complaint   No chest pain  Patient Profile   Tiffany Odonnell is a 71 y.o. female originally from [place], with a hx of DM, HLD, HTN, neuropathy, Sjogren's dz, anxiety, who is being seen 08/01/2021 for the evaluation of chest pain at the request of Dr Shyrl Numbers.  Subjective   No chest pain - tentative plan for CABG tomorrow with Dr. Laneta Simmers.  Objective   Vitals:   08/04/21 0219 08/04/21 0357 08/04/21 0817 08/04/21 1100  BP:  110/66 99/71 106/68  Pulse:  67 75 65  Resp:  15 18 15   Temp:  97.8 F (36.6 C)  97.9 F (36.6 C)  TempSrc:  Oral  Oral  SpO2:  97%  97%  Weight: 68.2 kg     Height:        Intake/Output Summary (Last 24 hours) at 08/04/2021 1244 Last data filed at 08/04/2021 1008 Gross per 24 hour  Intake 1077.83 ml  Output 1550 ml  Net -472.17 ml   Filed Weights   08/03/21 0000 08/03/21 0558 08/04/21 0219  Weight: 68.7 kg 68.1 kg 68.2 kg    Physical Exam   General appearance: alert and no distress Lungs: clear to auscultation bilaterally Heart: regular rate and rhythm, S1, S2 normal, no murmur, click, rub or gallop Extremities: extremities normal, atraumatic, no cyanosis or edema Neurologic: Grossly normal  Inpatient Medications    Scheduled Meds:  aspirin EC  81 mg Oral Daily   atorvastatin  80 mg Oral QHS   carvedilol  6.25 mg Oral BID WC   [START ON 08/05/2021] epinephrine  0-10 mcg/min Intravenous To OR   [START ON 08/05/2021] heparin-papaverine-plasmalyte irrigation   Irrigation To OR   insulin aspart  0-15 Units Subcutaneous TID WC   insulin aspart  0-5 Units Subcutaneous QHS   [START ON 08/05/2021] insulin   Intravenous To OR   magnesium oxide  400 mg Oral Daily   [START ON 08/05/2021] magnesium sulfate  40 mEq Other To OR   [START ON 08/05/2021] phenylephrine  30-200 mcg/min Intravenous To OR   [START ON 08/05/2021]  potassium chloride  80 mEq Other To OR   potassium chloride  20 mEq Oral Daily   sodium chloride flush  3 mL Intravenous Q12H   [START ON 08/05/2021] tranexamic acid  15 mg/kg Intravenous To OR   [START ON 08/05/2021] tranexamic acid  2 mg/kg Intracatheter To OR    Continuous Infusions:  sodium chloride     sodium chloride     [START ON 08/05/2021]  ceFAZolin (ANCEF) IV     [START ON 08/05/2021]  ceFAZolin (ANCEF) IV     [START ON 08/05/2021] dexmedetomidine     [START ON 08/05/2021] heparin 30,000 units/NS 1000 mL solution for CELLSAVER     heparin 1,050 Units/hr (08/04/21 0727)   [START ON 08/05/2021] milrinone     [START ON 08/05/2021] nitroGLYCERIN     [START ON 08/05/2021] norepinephrine     [START ON 08/05/2021] tranexamic acid (CYKLOKAPRON) infusion ([facility])     [START ON 08/05/2021] vancomycin      PRN Meds: sodium chloride, sodium chloride, acetaminophen, ALPRAZolam, cyclobenzaprine, diphenhydrAMINE-zinc acetate, docusate sodium, nitroGLYCERIN, ondansetron (ZOFRAN) IV, sodium chloride flush, zolpidem   Labs   Results for orders placed or performed during the hospital encounter of 08/01/21 (from the past 48 hour(s))  CBG monitoring, ED  Status: None   Collection Time: 08/02/21  2:00 PM  Result Value Ref Range   Glucose-Capillary 84 70 - 99 mg/dL    Comment: Glucose reference range applies only to samples taken after fasting for at least 8 hours.  Glucose, capillary     Status: None   Collection Time: 08/02/21  4:30 PM  Result Value Ref Range   Glucose-Capillary 95 70 - 99 mg/dL    Comment: Glucose reference range applies only to samples taken after fasting for at least 8 hours.  Glucose, capillary     Status: Abnormal   Collection Time: 08/02/21 10:08 PM  Result Value Ref Range   Glucose-Capillary 169 (H) 70 - 99 mg/dL    Comment: Glucose reference range applies only to samples taken after fasting for at least 8 hours.   Comment 1 Notify RN    Comment 2 Document in Chart   Heparin  level (unfractionated)     Status: Abnormal   Collection Time: 08/03/21  5:46 AM  Result Value Ref Range   Heparin Unfractionated 0.11 (L) 0.30 - 0.70 IU/mL    Comment: (NOTE) The clinical reportable range upper limit is being lowered to >1.10 to align with the FDA approved guidance for the current laboratory assay.  If heparin results are below expected values, and patient dosage has  been confirmed, suggest follow up testing of antithrombin III levels. Performed at [facility], [address], [place], [place] [address]   CBC     Status: None   Collection Time: 08/03/21  5:46 AM  Result Value Ref Range   WBC 7.7 4.0 - 10.5 K/uL   RBC 4.20 3.87 - 5.11 MIL/uL   Hemoglobin 13.0 12.0 - 15.0 g/dL   HCT 81.4 48.1 - 85.6 %   MCV 93.1 80.0 - 100.0 fL   MCH 31.0 26.0 - 34.0 pg   MCHC 33.2 30.0 - 36.0 g/dL   RDW 31.4 97.0 - 26.3 %   Platelets 272 150 - 400 K/uL   nRBC 0.0 0.0 - 0.2 %    Comment: Performed at [facility], [address], [place], [place] [address]  Glucose, capillary     Status: Abnormal   Collection Time: 08/03/21 11:52 AM  Result Value Ref Range   Glucose-Capillary 113 (H) 70 - 99 mg/dL    Comment: Glucose reference range applies only to samples taken after fasting for at least 8 hours.  Urinalysis, Complete w Microscopic Urine, Clean Catch     Status: Abnormal   Collection Time: 08/03/21  2:30 PM  Result Value Ref Range   Color, Urine YELLOW YELLOW   APPearance HAZY (A) CLEAR   Specific Gravity, Urine 1.015 1.005 - 1.030   pH 6.0 5.0 - 8.0   Glucose, UA NEGATIVE NEGATIVE mg/dL   Hgb urine dipstick SMALL (A) NEGATIVE    Comment:  INTERFACE ISSUE A. LAFRANCE 08/03/21 1534 CORRECTED RESULTS CALLED TO:  Westley Foots, RN 08/03/21 1544 A. LAFRANCE CORRECTED ON 02/01 AT 1545: PREVIOUSLY REPORTED AS NEGATIVE    Bilirubin Urine NEGATIVE NEGATIVE   Ketones, ur NEGATIVE NEGATIVE mg/dL   Protein, ur NEGATIVE NEGATIVE mg/dL   Nitrite POSITIVE (A)  NEGATIVE    Comment:  INTERFACE ISSUE A. LAFRANCE 08/03/21 1534 CORRECTED RESULTS CALLED TO:  Westley Foots, RN 08/03/21 1544 A. LAFRANCE CORRECTED ON 02/01 AT 1545: PREVIOUSLY REPORTED AS NEGATIVE    Leukocytes,Ua LARGE (A) NEGATIVE    Comment:  INTERFACE ISSUE A. LAFRANCE 08/03/21  1534 A. LAFRANCE CORRECTED RESULTS CALLED TO:  Westley FootsSHLONDRIA FRINK, RN 08/03/21 1544 A. LAFRANCE CORRECTED ON 02/01 AT 1545: PREVIOUSLY REPORTED AS NEGATIVE    Squamous Epithelial / LPF 0-5 0 - 5   WBC, UA 11-20 0 - 5 WBC/hpf   RBC / HPF 0-5 0 - 5 RBC/hpf   Bacteria, UA MANY (A) NONE SEEN   Mucus PRESENT    Hyaline Casts, UA PRESENT     Comment: Performed at [facility], [address] [place], [place] [address]  Heparin level (unfractionated)     Status: Abnormal   Collection Time: 08/03/21  3:54 PM  Result Value Ref Range   Heparin Unfractionated 0.23 (L) 0.30 - 0.70 IU/mL    Comment: (NOTE) The clinical reportable range upper limit is being lowered to >1.10 to align with the FDA approved guidance for the current laboratory assay.  If heparin results are below expected values, and patient dosage has  been confirmed, suggest follow up testing of antithrombin III levels. Performed at [facility], [address] [place], [place] [address]   Glucose, capillary     Status: Abnormal   Collection Time: 08/03/21  4:51 PM  Result Value Ref Range   Glucose-Capillary 113 (H) 70 - 99 mg/dL    Comment: Glucose reference range applies only to samples taken after fasting for at least 8 hours.  Glucose, capillary     Status: Abnormal   Collection Time: 08/03/21  9:24 PM  Result Value Ref Range   Glucose-Capillary 111 (H) 70 - 99 mg/dL    Comment: Glucose reference range applies only to samples taken after fasting for at least 8 hours.   Comment 1 Notify RN    Comment 2 Document in Chart   Heparin level (unfractionated)     Status: None   Collection Time: 08/04/21  1:57 AM  Result Value Ref  Range   Heparin Unfractionated 0.41 0.30 - 0.70 IU/mL    Comment: (NOTE) The clinical reportable range upper limit is being lowered to >1.10 to align with the FDA approved guidance for the current laboratory assay.  If heparin results are below expected values, and patient dosage has  been confirmed, suggest follow up testing of antithrombin III levels. Performed at [facility], [address] [place], [place] [address]   CBC     Status: None   Collection Time: 08/04/21  1:57 AM  Result Value Ref Range   WBC 7.7 4.0 - 10.5 K/uL   RBC 4.19 3.87 - 5.11 MIL/uL   Hemoglobin 13.3 12.0 - 15.0 g/dL   HCT 46.939.2 62.936.0 - 52.846.0 %   MCV 93.6 80.0 - 100.0 fL   MCH 31.7 26.0 - 34.0 pg   MCHC 33.9 30.0 - 36.0 g/dL   RDW 41.312.4 24.411.5 - 01.015.5 %   Platelets 288 150 - 400 K/uL   nRBC 0.0 0.0 - 0.2 %    Comment: Performed at [facility], [address] [place], [place] [address]  Basic metabolic panel     Status: Abnormal   Collection Time: 08/04/21  1:57 AM  Result Value Ref Range   Sodium 136 135 - 145 mmol/L   Potassium 4.4 3.5 - 5.1 mmol/L   Chloride 102 98 - 111 mmol/L   CO2 24 22 - 32 mmol/L   Glucose, Bld 118 (H) 70 - 99 mg/dL    Comment: Glucose reference range applies only to samples taken after fasting for  at least 8 hours.   BUN 17 8 - 23 mg/dL   Creatinine, Ser 1.61 0.44 - 1.00 mg/dL   Calcium 9.3 8.9 - 09.6 mg/dL   GFR, Estimated >04 >54 mL/min    Comment: (NOTE) Calculated using the CKD-EPI Creatinine Equation (2021)    Anion gap 10 5 - 15    Comment: Performed at [facility], [address], [place], [place] [address]  Glucose, capillary     Status: Abnormal   Collection Time: 08/04/21  6:10 AM  Result Value Ref Range   Glucose-Capillary 117 (H) 70 - 99 mg/dL    Comment: Glucose reference range applies only to samples taken after fasting for at least 8 hours.   Comment 1 Notify RN    Comment 2 Document in Chart   Glucose, capillary     Status: Abnormal    Collection Time: 08/04/21 10:57 AM  Result Value Ref Range   Glucose-Capillary 116 (H) 70 - 99 mg/dL    Comment: Glucose reference range applies only to samples taken after fasting for at least 8 hours.    ECG   N/A - Personally Reviewed  Telemetry   Sinus bradycardia- Personally Reviewed  Radiology    CARDIAC CATHETERIZATION  Result Date: 08/03/2021   Prox LAD lesion is 75% stenosed.   Prox LAD to Mid LAD lesion is 100% stenosed.  Right to left collaterals fill the mid to distal LAD.   1st Diag lesion is 95% stenosed.   Prox RCA lesion is 40% stenosed.   Mid RCA lesion is 25% stenosed.   The left ventricular systolic function is normal.   LV end diastolic pressure is normal.   The left ventricular ejection fraction is 55-65% by visual estimate.   There is no aortic valve stenosis. 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  Plan for cardiac surgery consult.   VAS US DOPPLER PRE CABG  Result Date: 08/03/2021 PREOPERATIVE VASCULAR EVALUATION Patient Name:  Tiffany Odonnell  Date of Exam:   08/03/2021 Medical Rec #: [id number]      Accession #:    [id number] Date of Birth: 05/22/1951      Patient Gender: F Patient Age:   68 years Exam Location:  [facility] Procedure:      VAS US DOPPLER PRE CABG Referring Phys: Evelene Croon --------------------------------------------------------------------------------  Indications:      Pre-CABG. Risk Factors:     Hypertension, hyperlipidemia, Diabetes, prior MI, coronary                   artery disease. Comparison Study: No previous exams Performing Technologist: Hill, Jody RVT, RDMS  Examination Guidelines: A complete evaluation includes B-mode imaging, spectral Doppler, color Doppler, and power Doppler as needed of all accessible portions of each vessel. Bilateral testing is considered an integral part of a complete examination. Limited examinations for reoccurring indications may be performed as noted.   Right Carotid Findings: +----------+--------+--------+--------+--------+------------------+             PSV cm/s EDV cm/s Stenosis Describe Comments            +----------+--------+--------+--------+--------+------------------+  CCA Prox   93       22                                             +----------+--------+--------+--------+--------+------------------+  CCA Distal 62       22                         intimal thickening  +----------+--------+--------+--------+--------+------------------+  ICA Prox   49       20                                             +----------+--------+--------+--------+--------+------------------+  ICA Distal 70       29                                             +----------+--------+--------+--------+--------+------------------+  ECA        51       8                                              +----------+--------+--------+--------+--------+------------------+ +----------+--------+-------+----------------+------------+             PSV cm/s EDV cms Describe         Arm Pressure  +----------+--------+-------+----------------+------------+  Subclavian 118              Multiphasic, WNL               +----------+--------+-------+----------------+------------+ +---------+--------+--+--------+--+---------+  Vertebral PSV cm/s 35 EDV cm/s 12 Antegrade  +---------+--------+--+--------+--+---------+ Left Carotid Findings: +----------+--------+--------+--------+--------+------------------+             PSV cm/s EDV cm/s Stenosis Describe Comments            +----------+--------+--------+--------+--------+------------------+  CCA Prox   95       26                                             +----------+--------+--------+--------+--------+------------------+  CCA Distal 60       21                         intimal thickening  +----------+--------+--------+--------+--------+------------------+  ICA Prox   50       22                                              +----------+--------+--------+--------+--------+------------------+  ICA Distal 63       24                                             +----------+--------+--------+--------+--------+------------------+  ECA        69       13                                             +----------+--------+--------+--------+--------+------------------+ +----------+--------+--------+----------------+------------+  Subclavian PSV cm/s EDV cm/s Describe         Arm Pressure  +----------+--------+--------+----------------+------------+             121               Multiphasic, WNL               +----------+--------+--------+----------------+------------+ +---------+--------+--+--------+--+---------+  Vertebral PSV cm/s 42 EDV cm/s 14 Antegrade  +---------+--------+--+--------+--+---------+  ABI Findings: +---------+------------------+-----+---------+--------+  Right     Rt Pressure (mmHg) Index Waveform  Comment   +---------+------------------+-----+---------+--------+  Brachial  133                      triphasic           +---------+------------------+-----+---------+--------+  PTA       174                1.31  triphasic           +---------+------------------+-----+---------+--------+  DP        141                1.06  biphasic            +---------+------------------+-----+---------+--------+  Great Toe 118                0.89  Normal              +---------+------------------+-----+---------+--------+ +---------+------------------+-----+---------+-------+  Left      Lt Pressure (mmHg) Index Waveform  Comment  +---------+------------------+-----+---------+-------+  Brachial  126                      triphasic          +---------+------------------+-----+---------+-------+  PTA       165                1.24  biphasic           +---------+------------------+-----+---------+-------+  DP        153                1.15  biphasic           +---------+------------------+-----+---------+-------+  Great Toe 136                1.02   Normal             +---------+------------------+-----+---------+-------+ +-------+---------------+----------------+  ABI/TBI Today's ABI/TBI Previous ABI/TBI  +-------+---------------+----------------+  Right   1.31 / 0.89                       +-------+---------------+----------------+  Left    1.24 / 1.02                       +-------+---------------+----------------+  Right Doppler Findings: +--------+--------+-----+---------+--------+  Site     Pressure Index Doppler   Comments  +--------+--------+-----+---------+--------+  Brachial 133            triphasic           +--------+--------+-----+---------+--------+  Radial                  triphasic           +--------+--------+-----+---------+--------+  Ulnar                   triphasic           +--------+--------+-----+---------+--------+  Left Doppler Findings: +--------+--------+-----+---------+--------+  Site  Pressure Index Doppler   Comments  +--------+--------+-----+---------+--------+  Brachial 126            triphasic           +--------+--------+-----+---------+--------+  Radial                  triphasic           +--------+--------+-----+---------+--------+  Ulnar                   triphasic           +--------+--------+-----+---------+--------+  Summary: Right Carotid: The extracranial vessels were near-normal with only minimal wall                thickening or plaque. Left Carotid: The extracranial vessels were near-normal with only minimal wall               thickening or plaque. Vertebrals:  Bilateral vertebral arteries demonstrate antegrade flow. Subclavians: Normal flow hemodynamics were seen in bilateral subclavian              arteries. Right ABI: Resting right ankle-brachial index is within normal range. No evidence of significant right lower extremity arterial disease. The right toe-brachial index is normal. Left ABI: Resting left ankle-brachial index is within normal range. No evidence of significant left lower extremity arterial  disease. The left toe-brachial index is normal. Bilateral Extremity: Doppler waveforms remain within normal limits with compression bilaterally for the radial arteries. Doppler waveforms remain within normal limits with compression bilaterally for the ulnar arteries.     Preliminary     Cardiac Studies   Above  Assessment   Principal Problem:   NSTEMI (non-ST elevated myocardial infarction) [facility](HCC)   Plan   Tiffany Odonnell is doing well without recurrent angina. Vitals stable. Labs normal. Plan for CABG tomorrow with Dr. Laneta SimmersBartle.  Time Spent Directly with Patient:  I have spent a total of 25 minutes with the patient reviewing hospital notes, telemetry, EKGs, labs and examining the patient as well as establishing an assessment and plan that was discussed personally with the patient.  > 50% of time was spent in direct patient care.  Length of Stay:  LOS: 3 days   Chrystie NoseKenneth C. Xylah Early, MD, [facility]ACC, FACP  [place]   [facility]  Medical Director of the Advanced Lipid Disorders &  Cardiovascular Risk Reduction Clinic Diplomate of the American Board of Clinical Lipidology Attending Cardiologist  Direct Dial: [phone]   Fax: [phone]  Website:  [email or web address]  [place] [employer] C Billee Balcerzak 08/04/2021, 12:44 PM

## 2021-08-04 NOTE — Consult Note (Signed)
[facility]    Date of Admission:  08/01/2021         Reason for Consult: UTI    Referring Provider: Dr. Laneta Simmers Primary Care Provider:   Assessment: Patient is here for NSTEMI, found to have severe CAD on left heart cath.  Plan for CABG tomorrow.  ID is consulted for possible UTI.  She has history of Right renal stone which was removed via nephrostomy tube and a ureteric stent.  Both were removed last month.  Her urine was positive for nitrites and leukocyte esterase.  Urine culture grew > 100k E.coli.  Patient however has no urinary symptoms.  She denies dysuria, frequency, suprapubic tenderness or flank pain.  Denies fever or chills.  Do not believe she has active urinary tract infection.  E. coli could be colonized.    She will received IV cefazolin intra and postop, which should be sufficient.  No additional antibiotics is indicated for asymptomatic bacteriuria.  Plan: No additional antibiotic She will follow-up with urology after discharge  Principal Problem:   NSTEMI (non-ST elevated myocardial infarction) (HCC)   Scheduled Meds:  aspirin EC  81 mg Oral Daily   atorvastatin  80 mg Oral QHS   carvedilol  6.25 mg Oral BID WC   [START ON 08/05/2021] epinephrine  0-10 mcg/min Intravenous To OR   [START ON 08/05/2021] heparin-papaverine-plasmalyte irrigation   Irrigation To OR   insulin aspart  0-15 Units Subcutaneous TID WC   insulin aspart  0-5 Units Subcutaneous QHS   [START ON 08/05/2021] insulin   Intravenous To OR   magnesium oxide  400 mg Oral Daily   [START ON 08/05/2021] magnesium sulfate  40 mEq Other To OR   [START ON 08/05/2021] phenylephrine  30-200 mcg/min Intravenous To OR   [START ON 08/05/2021] potassium chloride  80 mEq Other To OR   potassium chloride  20 mEq Oral Daily   sodium chloride flush  3 mL Intravenous Q12H   [START ON 08/05/2021] tranexamic acid  15 mg/kg Intravenous To OR   [START ON 08/05/2021] tranexamic acid  2 mg/kg  Intracatheter To OR   Continuous Infusions:  sodium chloride     sodium chloride     [START ON 08/05/2021]  ceFAZolin (ANCEF) IV     [START ON 08/05/2021]  ceFAZolin (ANCEF) IV     [START ON 08/05/2021] dexmedetomidine     [START ON 08/05/2021] heparin 30,000 units/NS 1000 mL solution for CELLSAVER     heparin 1,050 Units/hr (08/04/21 0727)   [START ON 08/05/2021] milrinone     [START ON 08/05/2021] nitroGLYCERIN     [START ON 08/05/2021] norepinephrine     [START ON 08/05/2021] tranexamic acid (CYKLOKAPRON) infusion ([facility])     [START ON 08/05/2021] vancomycin     PRN Meds:.sodium chloride, sodium chloride, acetaminophen, ALPRAZolam, cyclobenzaprine, diphenhydrAMINE-zinc acetate, docusate sodium, nitroGLYCERIN, ondansetron (ZOFRAN) IV, sodium chloride flush, zolpidem  HPI: Tiffany Odonnell is a 71 y.o. female with past medical history of diabetes, hypertension, hyperlipidemia, who presented to the hospital for chest pain found to have NSTEMI.  Left heart cath showed severe CAD which she is planned for CABG tomorrow.  ID team is consulted for possible UTI.  Patient report history of renal stone on the right kidney that was removed with a nephrostomy tube on 1/13.  She also has a right ureteral stent placed which was removed on 1/23.  She received Keflex before and after stent removal.  She is concerned about possible UTI due to the smell of the urine as well as the color.  She endorses dysuria but has resolved after her stent removed.  Currently she denies dysuria, urinary frequency, suprapubic tenderness or flank pain.  Review of Systems: Per HPI  Past Medical History:  Diagnosis Date   Anxiety    Blood in urine    Cervical disc disorder    bulging disc   Diabetes mellitus without complication (HCC)    Dizziness    Falls    Fibromyalgia    H/O Salmonella gastroenteritis    High cholesterol    History of frequent urinary tract infections    History of hiatal hernia    History of vertigo     Hypertension    Imbalance    Kidney stones    Lichen planopilaris    Lumbar herniated disc    L5-L6   Numbness and tingling    hands and feet bilat    Oscillopsia    Peripheral neuropathy    Septicemia (HCC)    2013   Sinusitis    Sjogren's disease (HCC)    Tinnitus    Wears glasses     Social History   Tobacco Use   Smoking status: Never   Smokeless tobacco: Never  Vaping Use   Vaping Use: Never used  Substance Use Topics   Alcohol use: No   Drug use: No    Family History  Problem Relation Age of Onset   CAD Mother    Diabetes Father    Non-Hodgkin's lymphoma Brother    Lupus Other    Hypertension Other    Diabetes Other    Glaucoma Maternal Uncle    Non-Hodgkin's lymphoma Brother    Non-Hodgkin's lymphoma Brother        half brother   Allergies  Allergen Reactions   Amoxicillin-Pot Clavulanate Nausea And Vomiting   Ciprofloxacin Hives   Sulfa Antibiotics Other (See Comments)    Trembling, paralyzing pain, red eyes, skin crawling   Latex Itching   Macrolides And Ketolides Diarrhea and Nausea And Vomiting   Tetracyclines & Related Other (See Comments) and Tinitus    Vertigo    OBJECTIVE: Blood pressure 106/68, pulse 65, temperature 97.9 F (36.6 C), temperature source Oral, resp. rate 15, height 5\' 1"  (1.549 m), weight 68.2 kg, SpO2 97 %.  Physical Exam Constitutional:      General: She is not in acute distress. Eyes:     General:        Right eye: No discharge.        Left eye: No discharge.     Conjunctiva/sclera: Conjunctivae normal.  Cardiovascular:     Rate and Rhythm: Normal rate and regular rhythm.  Pulmonary:     Effort: Pulmonary effort is normal. No respiratory distress.     Breath sounds: Normal breath sounds.  Abdominal:     Comments: No suprapubic tenderness.  No CVA tenderness.  Skin:    General: Skin is warm.  Neurological:     General: No focal deficit present.     Mental Status: She is alert.  Psychiatric:        Mood  and Affect: Mood normal.        Behavior: Behavior normal.    Lab Results Lab Results  Component Value Date   WBC 7.7 08/04/2021   HGB 13.3 08/04/2021   HCT 39.2 08/04/2021   MCV 93.6 08/04/2021   PLT 288 08/04/2021  Lab Results  Component Value Date   CREATININE 0.90 08/04/2021   BUN 17 08/04/2021   NA 136 08/04/2021   K 4.4 08/04/2021   CL 102 08/04/2021   CO2 24 08/04/2021    Lab Results  Component Value Date   ALT 21 08/02/2021   AST 32 08/02/2021   ALKPHOS 62 08/02/2021   BILITOT 0.9 08/02/2021     Microbiology: Recent Results (from the past 240 hour(s))  Resp Panel by RT-PCR (Flu A&B, Covid) Nasopharyngeal Swab     Status: None   Collection Time: 08/01/21  2:40 PM   Specimen: Nasopharyngeal Swab; Nasopharyngeal(NP) swabs in vial transport medium  Result Value Ref Range Status   SARS Coronavirus 2 by RT PCR NEGATIVE NEGATIVE Final    Comment: (NOTE) SARS-CoV-2 target nucleic acids are NOT DETECTED.  The SARS-CoV-2 RNA is generally detectable in upper respiratory specimens during the acute phase of infection. The lowest concentration of SARS-CoV-2 viral copies this assay can detect is 138 copies/mL. A negative result does not preclude SARS-Cov-2 infection and should not be used as the sole basis for treatment or other patient management decisions. A negative result may occur with  improper specimen collection/handling, submission of specimen other than nasopharyngeal swab, presence of viral mutation(s) within the areas targeted by this assay, and inadequate number of viral copies(<138 copies/mL). A negative result must be combined with clinical observations, patient history, and epidemiological information. The expected result is Negative.  Fact Sheet for Patients:  BloggerCourse.comhttps://www.fda.gov/media/152166/download  Fact Sheet for Healthcare Providers:  SeriousBroker.ithttps://www.fda.gov/media/152162/download  This test is no t yet approved or cleared by the [place]  FDA and  has been authorized for detection and/or diagnosis of SARS-CoV-2 by FDA under an Emergency Use Authorization (EUA). This EUA will remain  in effect (meaning this test can be used) for the duration of the COVID-19 declaration under Section 564(b)(1) of the Act, 21 U.S.C.section 360bbb-3(b)(1), unless the authorization is terminated  or revoked sooner.       Influenza A by PCR NEGATIVE NEGATIVE Final   Influenza B by PCR NEGATIVE NEGATIVE Final    Comment: (NOTE) The Xpert Xpress SARS-CoV-2/FLU/RSV plus assay is intended as an aid in the diagnosis of influenza from Nasopharyngeal swab specimens and should not be used as a sole basis for treatment. Nasal washings and aspirates are unacceptable for Xpert Xpress SARS-CoV-2/FLU/RSV testing.  Fact Sheet for Patients: BloggerCourse.comhttps://www.fda.gov/media/152166/download  Fact Sheet for Healthcare Providers: SeriousBroker.ithttps://www.fda.gov/media/152162/download  This test is not yet approved or cleared by the [place] FDA and has been authorized for detection and/or diagnosis of SARS-CoV-2 by FDA under an Emergency Use Authorization (EUA). This EUA will remain in effect (meaning this test can be used) for the duration of the COVID-19 declaration under Section 564(b)(1) of the Act, 21 U.S.C. section 360bbb-3(b)(1), unless the authorization is terminated or revoked.  Performed at [facility], [address] [place], [place] [address]   Urine Culture     Status: Abnormal (Preliminary result)   Collection Time: 08/03/21  2:30 PM   Specimen: Urine, Clean Catch  Result Value Ref Range Status   Specimen Description URINE, CLEAN CATCH  Final   Special Requests NONE  Final   Culture (A)  Final    >=100,000 COLONIES/mL ESCHERICHIA COLI SUSCEPTIBILITIES TO FOLLOW Performed at [facility], [address] [place], [place] [address]    Report Status PENDING  Incomplete    Doran StablerQuan  Neziah Braley, MD [facility]  [facility] [phone] pager   [phone] cell 08/04/2021, 3:40 PM

## 2021-08-04 NOTE — Progress Notes (Signed)
Patient is alert and oriented x 4 and has no questions about AM procedure. Patient has signed informed consent and blood consent. RN collected and sent prn hema-occult to rule out any bloody stool. Patient relayed to RN that she has hemorrhoids that may bleed when having a BM. Patient's consent has been placed in the chart.

## 2021-08-04 NOTE — Progress Notes (Signed)
Pt refused Lipitor again last night. Pt states she had not been taking Lipitor at home after last doctor's appointment, but had instead decided to diet and exercise.

## 2021-08-04 NOTE — Anesthesia Preprocedure Evaluation (Addendum)
Anesthesia Evaluation  Patient identified by MRN, date of birth, ID band Patient awake    Reviewed: Allergy & Precautions, NPO status , Patient's Chart, lab work & pertinent test results, reviewed documented beta blocker date and time   Airway Mallampati: I  TM Distance: >3 FB Neck ROM: Full    Dental  (+) Dental Advisory Given, Partial Lower, Partial Upper   Pulmonary neg pulmonary ROS,    Pulmonary exam normal breath sounds clear to auscultation       Cardiovascular hypertension, Pt. on home beta blockers + CAD and + Past MI  Normal cardiovascular exam Rhythm:Regular Rate:Normal  Echo 08/02/21: 1. Left ventricular ejection fraction, by estimation, is 60 to 65%. The  left ventricle has normal function. The left ventricle has no regional  wall motion abnormalities. Left ventricular diastolic parameters are  consistent with Grade I diastolic  dysfunction (impaired relaxation).  2. Right ventricular systolic function is normal. The right ventricular  size is normal. There is normal pulmonary artery systolic pressure.  3. The mitral valve is normal in structure. Mild mitral valve  regurgitation. No evidence of mitral stenosis.  4. The aortic valve is tricuspid. There is mild calcification of the  aortic valve. Aortic valve regurgitation is not visualized. Aortic valve  sclerosis is present, with no evidence of aortic valve stenosis.  5. The inferior vena cava is normal in size with greater than 50%  respiratory variability, suggesting right atrial pressure of 3 mmHg.   Neuro/Psych PSYCHIATRIC DISORDERS Anxiety  Neuromuscular disease    GI/Hepatic Neg liver ROS, hiatal hernia,   Endo/Other  diabetes  Renal/GU negative Renal ROS     Musculoskeletal  (+) Fibromyalgia -Sjogren's disease   Abdominal   Peds  Hematology negative hematology ROS (+)   Anesthesia Other Findings   Reproductive/Obstetrics                             Anesthesia Physical Anesthesia Plan  ASA: 4  Anesthesia Plan: General   Post-op Pain Management:    Induction: Intravenous  PONV Risk Score and Plan: 3 and Midazolam and Treatment may vary due to age or medical condition  Airway Management Planned: Oral ETT  Additional Equipment: Arterial line, CVP, PA Cath, TEE and Ultrasound Guidance Line Placement  Intra-op Plan:   Post-operative Plan: Post-operative intubation/ventilation  Informed Consent: I have reviewed the patients History and Physical, chart, labs and discussed the procedure including the risks, benefits and alternatives for the proposed anesthesia with the patient or authorized representative who has indicated his/her understanding and acceptance.     Dental advisory given  Plan Discussed with: CRNA  Anesthesia Plan Comments:        Anesthesia Quick Evaluation

## 2021-08-04 NOTE — TOC Progression Note (Signed)
Transition of Care [facility]) - Progression Note    Patient Details  Name: Tiffany Odonnell MRN: [id number] Date of Birth: [birth year]  [facility] of [facility]) CM/SW Contact  Leone Haven, RN Phone Number: 08/04/2021, 12:27 PM  Clinical Narrative:    for CABG tomorrow, conts on hep drip. TOC will continue to follow for dc needs.        Expected Discharge Plan and Services                                                 Social Determinants of Health (SDOH) Interventions    Readmission Risk Interventions No flowsheet data found.

## 2021-08-04 NOTE — Plan of Care (Signed)
  Problem: Education: Goal: Knowledge of General Education information will improve Description: Including pain rating scale, medication(s)/side effects and non-pharmacologic comfort measures Outcome: Progressing   Problem: Clinical Measurements: Goal: Ability to maintain clinical measurements within normal limits will improve Outcome: Progressing Goal: Will remain free from infection Outcome: Progressing   

## 2021-08-04 NOTE — Progress Notes (Signed)
ANTICOAGULATION CONSULT NOTE   Pharmacy Consult for heparin Indication: chest pain/ACS  Allergies  Allergen Reactions   Amoxicillin-Pot Clavulanate Nausea And Vomiting   Ciprofloxacin Hives   Sulfa Antibiotics Other (See Comments)    Trembling, paralyzing pain, red eyes, skin crawling   Latex Itching   Macrolides And Ketolides Diarrhea and Nausea And Vomiting   Tetracyclines & Related Other (See Comments) and Tinitus    Vertigo    Patient Measurements: Height: 5\' 1"  (154.9 cm) Weight: 68.2 kg (150 lb 5.7 oz) IBW/kg (Calculated) : 47.8 Heparin Dosing Weight: 63kg  Vital Signs: Temp: 98.2 F (36.8 C) (02/01 1955) Temp Source: Oral (02/01 1955) BP: 100/56 (02/01 1955) Pulse Rate: 72 (02/01 1955)  Labs: Recent Labs    08/01/21 1321 08/01/21 1522 08/01/21 2237 08/01/21 2348 08/02/21 0400 08/03/21 0546 08/03/21 1554 08/04/21 0157  HGB 12.5  --   --   --  13.7 13.0  --  13.3  HCT 37.4  --   --   --  40.6 39.1  --  39.2  PLT 285  --   --   --  300 272  --  288  HEPARINUNFRC  --   --   --    < > 0.48 0.11* 0.23* 0.41  CREATININE 0.99  --   --   --  0.87  --   --   --   TROPONINIHS 73* 433* 1,543*  --   --   --   --   --    < > = values in this interval not displayed.     Estimated Creatinine Clearance: 53.2 mL/min (by C-G formula based on SCr of 0.87 mg/dL).  Assessment: 1 YOF presenting with CP and found to have complex, 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 Pending consult for CABG. No anticoagulation prior to admission. Pharmacy consulted for heparin.     Post cardiac cath 08/02/21 PM.    Heparin level now therapeutic   Goal of Therapy:  Heparin level 0.3-0.7 units/ml Monitor platelets by anticoagulation protocol: Yes   Plan:  Continue heparin at 1050 units / hr Daily heparin level, CBC, s/s bleeding Planning for CABG on Friday  Thank you Anette Guarneri, PharmD   Please check AMION for all [employer] numbers After 10:00 PM,  call [place]

## 2021-08-05 ENCOUNTER — Inpatient Hospital Stay (HOSPITAL_COMMUNITY)
Admission: EM | Disposition: A | Discharge status: Home / Self Care | Payer: Self-pay | Source: Home / Self Care | Attending: Surgery

## 2021-08-05 ENCOUNTER — Other Ambulatory Visit: Payer: Self-pay

## 2021-08-05 ENCOUNTER — Inpatient Hospital Stay (HOSPITAL_COMMUNITY): Payer: Medicare Other

## 2021-08-05 ENCOUNTER — Inpatient Hospital Stay (HOSPITAL_COMMUNITY): Payer: Medicare Other | Admitting: Anesthesiology

## 2021-08-05 DIAGNOSIS — I2510 Atherosclerotic heart disease of native coronary artery without angina pectoris: Secondary | ICD-10-CM

## 2021-08-05 DIAGNOSIS — Z951 Presence of aortocoronary bypass graft: Secondary | ICD-10-CM

## 2021-08-05 HISTORY — PX: TEE WITHOUT CARDIOVERSION: SHX5443

## 2021-08-05 HISTORY — PX: ENDOVEIN HARVEST OF GREATER SAPHENOUS VEIN: SHX5059

## 2021-08-05 HISTORY — PX: CORONARY ARTERY BYPASS GRAFT: SHX141

## 2021-08-05 LAB — BLOOD GAS, ARTERIAL
Acid-base deficit: 1.4 mmol/L (ref 0.0–2.0)
Allens test (pass/fail): POSITIVE — AB
Bicarbonate: 22.5 mmol/L (ref 20.0–28.0)
Drawn by: 270271
FIO2: 21
O2 Saturation: 95.8 %
Patient temperature: 37
pCO2 arterial: 36.2 mmHg (ref 32.0–48.0)
pH, Arterial: 7.411 (ref 7.350–7.450)
pO2, Arterial: 79.2 mmHg — ABNORMAL LOW (ref 83.0–108.0)

## 2021-08-05 LAB — POCT I-STAT 7, (LYTES, BLD GAS, ICA,H+H)
Acid-Base Excess: 1 mmol/L (ref 0.0–2.0)
Acid-Base Excess: 1 mmol/L (ref 0.0–2.0)
Acid-Base Excess: 1 mmol/L (ref 0.0–2.0)
Acid-base deficit: 2 mmol/L (ref 0.0–2.0)
Acid-base deficit: 6 mmol/L — ABNORMAL HIGH (ref 0.0–2.0)
Acid-base deficit: 1 mmol/L (ref 0.0–2.0)
Acid-base deficit: 1 mmol/L (ref 0.0–2.0)
Acid-base deficit: 2 mmol/L (ref 0.0–2.0)
Acid-base deficit: 2 mmol/L (ref 0.0–2.0)
Bicarbonate: 23.5 mmol/L (ref 20.0–28.0)
Bicarbonate: 24.1 mmol/L (ref 20.0–28.0)
Bicarbonate: 25.3 mmol/L (ref 20.0–28.0)
Bicarbonate: 25.6 mmol/L (ref 20.0–28.0)
Bicarbonate: 21.5 mmol/L (ref 20.0–28.0)
Bicarbonate: 23.5 mmol/L (ref 20.0–28.0)
Bicarbonate: 23.7 mmol/L (ref 20.0–28.0)
Bicarbonate: 23.2 mmol/L (ref 20.0–28.0)
Bicarbonate: 23.3 mmol/L (ref 20.0–28.0)
Calcium, Ion: 1.25 mmol/L (ref 1.15–1.40)
Calcium, Ion: 0.97 mmol/L — ABNORMAL LOW (ref 1.15–1.40)
Calcium, Ion: 1.02 mmol/L — ABNORMAL LOW (ref 1.15–1.40)
Calcium, Ion: 1.07 mmol/L — ABNORMAL LOW (ref 1.15–1.40)
Calcium, Ion: 1.14 mmol/L — ABNORMAL LOW (ref 1.15–1.40)
Calcium, Ion: 1.07 mmol/L — ABNORMAL LOW (ref 1.15–1.40)
Calcium, Ion: 1.08 mmol/L — ABNORMAL LOW (ref 1.15–1.40)
Calcium, Ion: 1.1 mmol/L — ABNORMAL LOW (ref 1.15–1.40)
Calcium, Ion: 1.06 mmol/L — ABNORMAL LOW (ref 1.15–1.40)
HCT: 39 % (ref 36.0–46.0)
HCT: 23 % — ABNORMAL LOW (ref 36.0–46.0)
HCT: 25 % — ABNORMAL LOW (ref 36.0–46.0)
HCT: 25 % — ABNORMAL LOW (ref 36.0–46.0)
HCT: 30 % — ABNORMAL LOW (ref 36.0–46.0)
HCT: 28 % — ABNORMAL LOW (ref 36.0–46.0)
HCT: 28 % — ABNORMAL LOW (ref 36.0–46.0)
HCT: 26 % — ABNORMAL LOW (ref 36.0–46.0)
HCT: 16 % — ABNORMAL LOW (ref 36.0–46.0)
Hemoglobin: 13.3 g/dL (ref 12.0–15.0)
Hemoglobin: 7.8 g/dL — ABNORMAL LOW (ref 12.0–15.0)
Hemoglobin: 8.5 g/dL — ABNORMAL LOW (ref 12.0–15.0)
Hemoglobin: 8.5 g/dL — ABNORMAL LOW (ref 12.0–15.0)
Hemoglobin: 10.2 g/dL — ABNORMAL LOW (ref 12.0–15.0)
Hemoglobin: 9.5 g/dL — ABNORMAL LOW (ref 12.0–15.0)
Hemoglobin: 9.5 g/dL — ABNORMAL LOW (ref 12.0–15.0)
Hemoglobin: 8.8 g/dL — ABNORMAL LOW (ref 12.0–15.0)
Hemoglobin: 5.4 g/dL — CL (ref 12.0–15.0)
O2 Saturation: 100 %
O2 Saturation: 100 %
O2 Saturation: 100 %
O2 Saturation: 100 %
O2 Saturation: 93 %
O2 Saturation: 99 %
O2 Saturation: 99 %
O2 Saturation: 99 %
O2 Saturation: 99 %
Patient temperature: 35.2
Patient temperature: 35.8
Patient temperature: 36.9
Patient temperature: 36.6
Patient temperature: 36.6
Potassium: 3.7 mmol/L (ref 3.5–5.1)
Potassium: 4.4 mmol/L (ref 3.5–5.1)
Potassium: 5.1 mmol/L (ref 3.5–5.1)
Potassium: 4.5 mmol/L (ref 3.5–5.1)
Potassium: 3.9 mmol/L (ref 3.5–5.1)
Potassium: 3.8 mmol/L (ref 3.5–5.1)
Potassium: 4.7 mmol/L (ref 3.5–5.1)
Potassium: 4.6 mmol/L (ref 3.5–5.1)
Potassium: 4.2 mmol/L (ref 3.5–5.1)
Sodium: 136 mmol/L (ref 135–145)
Sodium: 138 mmol/L (ref 135–145)
Sodium: 139 mmol/L (ref 135–145)
Sodium: 137 mmol/L (ref 135–145)
Sodium: 140 mmol/L (ref 135–145)
Sodium: 143 mmol/L (ref 135–145)
Sodium: 141 mmol/L (ref 135–145)
Sodium: 139 mmol/L (ref 135–145)
Sodium: 141 mmol/L (ref 135–145)
TCO2: 25 mmol/L (ref 22–32)
TCO2: 25 mmol/L (ref 22–32)
TCO2: 26 mmol/L (ref 22–32)
TCO2: 27 mmol/L (ref 22–32)
TCO2: 23 mmol/L (ref 22–32)
TCO2: 25 mmol/L (ref 22–32)
TCO2: 25 mmol/L (ref 22–32)
TCO2: 24 mmol/L (ref 22–32)
TCO2: 24 mmol/L (ref 22–32)
pCO2 arterial: 40.8 mmHg (ref 32.0–48.0)
pCO2 arterial: 30.8 mmHg — ABNORMAL LOW (ref 32.0–48.0)
pCO2 arterial: 36.9 mmHg (ref 32.0–48.0)
pCO2 arterial: 40.8 mmHg (ref 32.0–48.0)
pCO2 arterial: 47.7 mmHg (ref 32.0–48.0)
pCO2 arterial: 36.2 mmHg (ref 32.0–48.0)
pCO2 arterial: 40.2 mmHg (ref 32.0–48.0)
pCO2 arterial: 40.4 mmHg (ref 32.0–48.0)
pCO2 arterial: 38.2 mmHg (ref 32.0–48.0)
pH, Arterial: 7.368 (ref 7.350–7.450)
pH, Arterial: 7.443 (ref 7.350–7.450)
pH, Arterial: 7.501 — ABNORMAL HIGH (ref 7.350–7.450)
pH, Arterial: 7.406 (ref 7.350–7.450)
pH, Arterial: 7.252 — ABNORMAL LOW (ref 7.350–7.450)
pH, Arterial: 7.416 (ref 7.350–7.450)
pH, Arterial: 7.378 (ref 7.350–7.450)
pH, Arterial: 7.366 (ref 7.350–7.450)
pH, Arterial: 7.392 (ref 7.350–7.450)
pO2, Arterial: 230 mmHg — ABNORMAL HIGH (ref 83.0–108.0)
pO2, Arterial: 325 mmHg — ABNORMAL HIGH (ref 83.0–108.0)
pO2, Arterial: 366 mmHg — ABNORMAL HIGH (ref 83.0–108.0)
pO2, Arterial: 356 mmHg — ABNORMAL HIGH (ref 83.0–108.0)
pO2, Arterial: 73 mmHg — ABNORMAL LOW (ref 83.0–108.0)
pO2, Arterial: 126 mmHg — ABNORMAL HIGH (ref 83.0–108.0)
pO2, Arterial: 159 mmHg — ABNORMAL HIGH (ref 83.0–108.0)
pO2, Arterial: 133 mmHg — ABNORMAL HIGH (ref 83.0–108.0)
pO2, Arterial: 162 mmHg — ABNORMAL HIGH (ref 83.0–108.0)

## 2021-08-05 LAB — POCT I-STAT, CHEM 8
BUN: 21 mg/dL (ref 8–23)
BUN: 18 mg/dL (ref 8–23)
BUN: 19 mg/dL (ref 8–23)
BUN: 19 mg/dL (ref 8–23)
Calcium, Ion: 1.27 mmol/L (ref 1.15–1.40)
Calcium, Ion: 0.94 mmol/L — ABNORMAL LOW (ref 1.15–1.40)
Calcium, Ion: 1.04 mmol/L — ABNORMAL LOW (ref 1.15–1.40)
Calcium, Ion: 1.05 mmol/L — ABNORMAL LOW (ref 1.15–1.40)
Chloride: 103 mmol/L (ref 98–111)
Chloride: 101 mmol/L (ref 98–111)
Chloride: 104 mmol/L (ref 98–111)
Chloride: 101 mmol/L (ref 98–111)
Creatinine, Ser: 0.9 mg/dL (ref 0.44–1.00)
Creatinine, Ser: 0.7 mg/dL (ref 0.44–1.00)
Creatinine, Ser: 0.7 mg/dL (ref 0.44–1.00)
Creatinine, Ser: 0.8 mg/dL (ref 0.44–1.00)
Glucose, Bld: 152 mg/dL — ABNORMAL HIGH (ref 70–99)
Glucose, Bld: 128 mg/dL — ABNORMAL HIGH (ref 70–99)
Glucose, Bld: 85 mg/dL (ref 70–99)
Glucose, Bld: 136 mg/dL — ABNORMAL HIGH (ref 70–99)
HCT: 34 % — ABNORMAL LOW (ref 36.0–46.0)
HCT: 23 % — ABNORMAL LOW (ref 36.0–46.0)
HCT: 24 % — ABNORMAL LOW (ref 36.0–46.0)
HCT: 25 % — ABNORMAL LOW (ref 36.0–46.0)
Hemoglobin: 11.6 g/dL — ABNORMAL LOW (ref 12.0–15.0)
Hemoglobin: 7.8 g/dL — ABNORMAL LOW (ref 12.0–15.0)
Hemoglobin: 8.2 g/dL — ABNORMAL LOW (ref 12.0–15.0)
Hemoglobin: 8.5 g/dL — ABNORMAL LOW (ref 12.0–15.0)
Potassium: 3.7 mmol/L (ref 3.5–5.1)
Potassium: 4.3 mmol/L (ref 3.5–5.1)
Potassium: 5.5 mmol/L — ABNORMAL HIGH (ref 3.5–5.1)
Potassium: 4.4 mmol/L (ref 3.5–5.1)
Sodium: 136 mmol/L (ref 135–145)
Sodium: 136 mmol/L (ref 135–145)
Sodium: 138 mmol/L (ref 135–145)
Sodium: 136 mmol/L (ref 135–145)
TCO2: 25 mmol/L (ref 22–32)
TCO2: 25 mmol/L (ref 22–32)
TCO2: 26 mmol/L (ref 22–32)
TCO2: 24 mmol/L (ref 22–32)

## 2021-08-05 LAB — GLUCOSE, CAPILLARY
Glucose-Capillary: 105 mg/dL — ABNORMAL HIGH (ref 70–99)
Glucose-Capillary: 116 mg/dL — ABNORMAL HIGH (ref 70–99)
Glucose-Capillary: 129 mg/dL — ABNORMAL HIGH (ref 70–99)
Glucose-Capillary: 132 mg/dL — ABNORMAL HIGH (ref 70–99)
Glucose-Capillary: 133 mg/dL — ABNORMAL HIGH (ref 70–99)
Glucose-Capillary: 148 mg/dL — ABNORMAL HIGH (ref 70–99)
Glucose-Capillary: 150 mg/dL — ABNORMAL HIGH (ref 70–99)
Glucose-Capillary: 154 mg/dL — ABNORMAL HIGH (ref 70–99)
Glucose-Capillary: 155 mg/dL — ABNORMAL HIGH (ref 70–99)
Glucose-Capillary: 167 mg/dL — ABNORMAL HIGH (ref 70–99)
Glucose-Capillary: 96 mg/dL (ref 70–99)

## 2021-08-05 LAB — BASIC METABOLIC PANEL
Anion gap: 9 (ref 5–15)
Anion gap: 7 (ref 5–15)
Anion gap: 8 (ref 5–15)
BUN: 21 mg/dL (ref 8–23)
BUN: 17 mg/dL (ref 8–23)
BUN: 17 mg/dL (ref 8–23)
CO2: 24 mmol/L (ref 22–32)
CO2: 22 mmol/L (ref 22–32)
CO2: 23 mmol/L (ref 22–32)
Calcium: 9.3 mg/dL (ref 8.9–10.3)
Calcium: 7.6 mg/dL — ABNORMAL LOW (ref 8.9–10.3)
Calcium: 7.7 mg/dL — ABNORMAL LOW (ref 8.9–10.3)
Chloride: 101 mmol/L (ref 98–111)
Chloride: 109 mmol/L (ref 98–111)
Chloride: 109 mmol/L (ref 98–111)
Creatinine, Ser: 1.14 mg/dL — ABNORMAL HIGH (ref 0.44–1.00)
Creatinine, Ser: 0.97 mg/dL (ref 0.44–1.00)
Creatinine, Ser: 0.98 mg/dL (ref 0.44–1.00)
GFR, Estimated: 52 mL/min — ABNORMAL LOW (ref >60)
GFR, Estimated: >60 mL/min (ref >60)
GFR, Estimated: >60 mL/min (ref >60)
Glucose, Bld: 125 mg/dL — ABNORMAL HIGH (ref 70–99)
Glucose, Bld: 125 mg/dL — ABNORMAL HIGH (ref 70–99)
Glucose, Bld: 147 mg/dL — ABNORMAL HIGH (ref 70–99)
Potassium: 4.3 mmol/L (ref 3.5–5.1)
Potassium: 4.6 mmol/L (ref 3.5–5.1)
Potassium: 4.6 mmol/L (ref 3.5–5.1)
Sodium: 134 mmol/L — ABNORMAL LOW (ref 135–145)
Sodium: 138 mmol/L (ref 135–145)
Sodium: 140 mmol/L (ref 135–145)

## 2021-08-05 LAB — CBC
HCT: 37.8 % (ref 36.0–46.0)
HCT: 28.3 % — ABNORMAL LOW (ref 36.0–46.0)
HCT: 27.1 % — ABNORMAL LOW (ref 36.0–46.0)
Hemoglobin: 13 g/dL (ref 12.0–15.0)
Hemoglobin: 9.3 g/dL — ABNORMAL LOW (ref 12.0–15.0)
Hemoglobin: 9.2 g/dL — ABNORMAL LOW (ref 12.0–15.0)
MCH: 32.2 pg (ref 26.0–34.0)
MCH: 31.7 pg (ref 26.0–34.0)
MCH: 31.8 pg (ref 26.0–34.0)
MCHC: 34.4 g/dL (ref 30.0–36.0)
MCHC: 32.9 g/dL (ref 30.0–36.0)
MCHC: 33.9 g/dL (ref 30.0–36.0)
MCV: 93.6 fL (ref 80.0–100.0)
MCV: 96.6 fL (ref 80.0–100.0)
MCV: 93.8 fL (ref 80.0–100.0)
Platelets: 263 10*3/uL (ref 150–400)
Platelets: 152 10*3/uL (ref 150–400)
Platelets: 155 10*3/uL (ref 150–400)
RBC: 4.04 MIL/uL (ref 3.87–5.11)
RBC: 2.93 MIL/uL — ABNORMAL LOW (ref 3.87–5.11)
RBC: 2.89 MIL/uL — ABNORMAL LOW (ref 3.87–5.11)
RDW: 12.3 % (ref 11.5–15.5)
RDW: 12.5 % (ref 11.5–15.5)
RDW: 12.3 % (ref 11.5–15.5)
WBC: 7.9 10*3/uL (ref 4.0–10.5)
WBC: 13.9 10*3/uL — ABNORMAL HIGH (ref 4.0–10.5)
WBC: 13.4 10*3/uL — ABNORMAL HIGH (ref 4.0–10.5)
nRBC: 0 % (ref 0.0–0.2)
nRBC: 0 % (ref 0.0–0.2)
nRBC: 0 % (ref 0.0–0.2)

## 2021-08-05 LAB — URINE CULTURE: Culture: >=100000 — AB

## 2021-08-05 LAB — CBC WITH DIFFERENTIAL/PLATELET
Abs Immature Granulocytes: 0.03 10*3/uL (ref 0.00–0.07)
Basophils Absolute: 0 10*3/uL (ref 0.0–0.1)
Basophils Relative: 0 %
Eosinophils Absolute: 0 10*3/uL (ref 0.0–0.5)
Eosinophils Relative: 0 %
HCT: 24.7 % — ABNORMAL LOW (ref 36.0–46.0)
Hemoglobin: 8.4 g/dL — ABNORMAL LOW (ref 12.0–15.0)
Immature Granulocytes: 0 %
Lymphocytes Relative: 4 %
Lymphs Abs: 0.5 10*3/uL — ABNORMAL LOW (ref 0.7–4.0)
MCH: 32.3 pg (ref 26.0–34.0)
MCHC: 34 g/dL (ref 30.0–36.0)
MCV: 95 fL (ref 80.0–100.0)
Monocytes Absolute: 0.5 10*3/uL (ref 0.1–1.0)
Monocytes Relative: 4 %
Neutro Abs: 10.7 10*3/uL — ABNORMAL HIGH (ref 1.7–7.7)
Neutrophils Relative %: 92 %
Platelets: 136 10*3/uL — ABNORMAL LOW (ref 150–400)
RBC: 2.6 MIL/uL — ABNORMAL LOW (ref 3.87–5.11)
RDW: 12.3 % (ref 11.5–15.5)
WBC: 11.7 10*3/uL — ABNORMAL HIGH (ref 4.0–10.5)
nRBC: 0 % (ref 0.0–0.2)

## 2021-08-05 LAB — HEMOGLOBIN AND HEMATOCRIT, BLOOD
HCT: 22.9 % — ABNORMAL LOW (ref 36.0–46.0)
Hemoglobin: 7.9 g/dL — ABNORMAL LOW (ref 12.0–15.0)

## 2021-08-05 LAB — MAGNESIUM
Magnesium: 3.2 mg/dL — ABNORMAL HIGH (ref 1.7–2.4)
Magnesium: 2.8 mg/dL — ABNORMAL HIGH (ref 1.7–2.4)

## 2021-08-05 LAB — PROTIME-INR
INR: 1 (ref 0.8–1.2)
INR: 1.3 — ABNORMAL HIGH (ref 0.8–1.2)
Prothrombin Time: 12.7 seconds (ref 11.4–15.2)
Prothrombin Time: 16.1 seconds — ABNORMAL HIGH (ref 11.4–15.2)

## 2021-08-05 LAB — ECHO INTRAOPERATIVE TEE
Height: 61 in
Weight: 2428.8 oz

## 2021-08-05 LAB — APTT: aPTT: 31 seconds (ref 24–36)

## 2021-08-05 LAB — POCT ACTIVATED CLOTTING TIME: Activated Clotting Time: 0 seconds

## 2021-08-05 LAB — HEPARIN LEVEL (UNFRACTIONATED): Heparin Unfractionated: 0.63 IU/mL (ref 0.30–0.70)

## 2021-08-05 LAB — PLATELET COUNT: Platelets: 167 10*3/uL (ref 150–400)

## 2021-08-05 LAB — PREPARE RBC (CROSSMATCH)

## 2021-08-05 SURGERY — CORONARY ARTERY BYPASS GRAFTING (CABG)
Anesthesia: General | Site: Leg Lower | Laterality: Right

## 2021-08-05 MED ORDER — ASPIRIN 81 MG PO CHEW
324.0000 mg | CHEWABLE_TABLET | Freq: Every day | ORAL | Status: DC
  Filled 2021-08-05: qty 4

## 2021-08-05 MED ORDER — THROMBIN (RECOMBINANT) 20000 UNITS EX SOLR
CUTANEOUS | Status: AC
  Filled 2021-08-05: qty 20000

## 2021-08-05 MED ORDER — TRAMADOL HCL 50 MG PO TABS
50.0000 mg | ORAL_TABLET | ORAL | Status: DC | PRN
  Administered 2021-08-06: 50 mg via ORAL
  Administered 2021-08-06 – 2021-08-10 (×3): 100 mg via ORAL
  Filled 2021-08-05 (×3): qty 2
  Filled 2021-08-05: qty 1

## 2021-08-05 MED ORDER — PANTOPRAZOLE SODIUM 40 MG PO TBEC
40.0000 mg | DELAYED_RELEASE_TABLET | Freq: Every day | ORAL | Status: DC
  Administered 2021-08-07 – 2021-08-11 (×5): 40 mg via ORAL
  Filled 2021-08-05 (×5): qty 1

## 2021-08-05 MED ORDER — ROCURONIUM BROMIDE 10 MG/ML (PF) SYRINGE
PREFILLED_SYRINGE | INTRAVENOUS | Status: DC | PRN
  Administered 2021-08-05: 50 mg via INTRAVENOUS
  Administered 2021-08-05: 30 mg via INTRAVENOUS
  Administered 2021-08-05: 100 mg via INTRAVENOUS
  Administered 2021-08-05: 70 mg via INTRAVENOUS

## 2021-08-05 MED ORDER — 0.9 % SODIUM CHLORIDE (POUR BTL) OPTIME
TOPICAL | Status: DC | PRN
  Administered 2021-08-05: 5000 mL

## 2021-08-05 MED ORDER — VANCOMYCIN HCL IN DEXTROSE 1-5 GM/200ML-% IV SOLN
1000.0000 mg | Freq: Once | INTRAVENOUS | Status: AC
  Administered 2021-08-05: 1000 mg via INTRAVENOUS
  Filled 2021-08-05: qty 200

## 2021-08-05 MED ORDER — LIDOCAINE 2% (20 MG/ML) 5 ML SYRINGE
INTRAMUSCULAR | Status: DC | PRN
  Administered 2021-08-05: 60 mg via INTRAVENOUS

## 2021-08-05 MED ORDER — MIDAZOLAM HCL 2 MG/2ML IJ SOLN: 2.0000 mg | INTRAMUSCULAR | Status: DC | PRN

## 2021-08-05 MED ORDER — PHENYLEPHRINE 40 MCG/ML (10ML) SYRINGE FOR IV PUSH (FOR BLOOD PRESSURE SUPPORT)
PREFILLED_SYRINGE | INTRAVENOUS | Status: AC
  Filled 2021-08-05: qty 10

## 2021-08-05 MED ORDER — SODIUM CHLORIDE 0.9 % IV SOLN
250.0000 mL | INTRAVENOUS | Status: DC
  Administered 2021-08-06: 250 mL via INTRAVENOUS

## 2021-08-05 MED ORDER — SODIUM CHLORIDE 0.9% FLUSH
10.0000 mL | Freq: Two times a day (BID) | INTRAVENOUS | Status: DC
  Administered 2021-08-05: 10 mL
  Administered 2021-08-06: 20 mL
  Administered 2021-08-06 – 2021-08-08 (×5): 10 mL

## 2021-08-05 MED ORDER — ALBUMIN HUMAN 5 % IV SOLN: INTRAVENOUS | Status: DC | PRN

## 2021-08-05 MED ORDER — METOPROLOL TARTRATE 25 MG/10 ML ORAL SUSPENSION: 12.5000 mg | Freq: Two times a day (BID) | ORAL | Status: DC

## 2021-08-05 MED ORDER — ORAL CARE MOUTH RINSE
15.0000 mL | OROMUCOSAL | Status: DC
  Administered 2021-08-05 – 2021-08-06 (×6): 15 mL via OROMUCOSAL

## 2021-08-05 MED ORDER — NITROGLYCERIN IN D5W 200-5 MCG/ML-% IV SOLN: 0.0000 ug/min | INTRAVENOUS | Status: DC

## 2021-08-05 MED ORDER — BISACODYL 10 MG RE SUPP: 10.0000 mg | Freq: Every day | RECTAL | Status: DC

## 2021-08-05 MED ORDER — ACETAMINOPHEN 160 MG/5ML PO SOLN: 1000.0000 mg | Freq: Four times a day (QID) | ORAL | Status: AC

## 2021-08-05 MED ORDER — ALBUMIN HUMAN 5 % IV SOLN
250.0000 mL | INTRAVENOUS | Status: AC | PRN
  Administered 2021-08-05 (×4): 12.5 g via INTRAVENOUS
  Filled 2021-08-05 (×2): qty 250

## 2021-08-05 MED ORDER — ACETAMINOPHEN 650 MG RE SUPP
650.0000 mg | Freq: Once | RECTAL | Status: AC
  Administered 2021-08-05: 650 mg via RECTAL

## 2021-08-05 MED ORDER — SODIUM CHLORIDE 0.9% FLUSH: 10.0000 mL | INTRAVENOUS | Status: DC | PRN

## 2021-08-05 MED ORDER — POTASSIUM CHLORIDE 10 MEQ/50ML IV SOLN
10.0000 meq | INTRAVENOUS | Status: AC
  Administered 2021-08-05 (×3): 10 meq via INTRAVENOUS

## 2021-08-05 MED ORDER — BISACODYL 5 MG PO TBEC
10.0000 mg | DELAYED_RELEASE_TABLET | Freq: Every day | ORAL | Status: DC
  Administered 2021-08-06 – 2021-08-08 (×3): 10 mg via ORAL
  Filled 2021-08-05 (×3): qty 2

## 2021-08-05 MED ORDER — SODIUM BICARBONATE 8.4 % IV SOLN
100.0000 meq | Freq: Once | INTRAVENOUS | Status: AC
  Administered 2021-08-05: 100 meq via INTRAVENOUS

## 2021-08-05 MED ORDER — LACTATED RINGERS IV SOLN: INTRAVENOUS | Status: DC | PRN

## 2021-08-05 MED ORDER — PHENYLEPHRINE 40 MCG/ML (10ML) SYRINGE FOR IV PUSH (FOR BLOOD PRESSURE SUPPORT)
PREFILLED_SYRINGE | INTRAVENOUS | Status: DC | PRN
  Administered 2021-08-05: 40 ug via INTRAVENOUS
  Administered 2021-08-05: 120 ug via INTRAVENOUS

## 2021-08-05 MED ORDER — MIDAZOLAM HCL (PF) 10 MG/2ML IJ SOLN
INTRAMUSCULAR | Status: AC
  Filled 2021-08-05: qty 2

## 2021-08-05 MED ORDER — LACTATED RINGERS IV SOLN
500.0000 mL | Freq: Once | INTRAVENOUS | Status: AC | PRN
  Administered 2021-08-05: 500 mL via INTRAVENOUS

## 2021-08-05 MED ORDER — FENTANYL CITRATE (PF) 250 MCG/5ML IJ SOLN
INTRAMUSCULAR | Status: AC
  Filled 2021-08-05: qty 5

## 2021-08-05 MED ORDER — THROMBIN 20000 UNITS EX SOLR
CUTANEOUS | Status: DC | PRN
Start: 2021-08-05 — End: 2021-08-05
  Administered 2021-08-05: 20000 [IU] via TOPICAL

## 2021-08-05 MED ORDER — DEXAMETHASONE SODIUM PHOSPHATE 10 MG/ML IJ SOLN
INTRAMUSCULAR | Status: DC | PRN
  Administered 2021-08-05: 10 mg via INTRAVENOUS

## 2021-08-05 MED ORDER — METOPROLOL TARTRATE 5 MG/5ML IV SOLN: 2.5000 mg | INTRAVENOUS | Status: DC | PRN

## 2021-08-05 MED ORDER — OXYCODONE HCL 5 MG PO TABS
5.0000 mg | ORAL_TABLET | ORAL | Status: DC | PRN
  Administered 2021-08-06: 5 mg via ORAL
  Filled 2021-08-05: qty 1

## 2021-08-05 MED ORDER — SODIUM CHLORIDE 0.9 % IV SOLN: INTRAVENOUS | Status: DC

## 2021-08-05 MED ORDER — MIDAZOLAM HCL (PF) 5 MG/ML IJ SOLN
INTRAMUSCULAR | Status: DC | PRN
  Administered 2021-08-05: 3 mg via INTRAVENOUS
  Administered 2021-08-05 (×2): 1 mg via INTRAVENOUS
  Administered 2021-08-05: 5 mg via INTRAVENOUS

## 2021-08-05 MED ORDER — MAGNESIUM SULFATE 4 GM/100ML IV SOLN
4.0000 g | Freq: Once | INTRAVENOUS | Status: AC
  Administered 2021-08-05: 4 g via INTRAVENOUS
  Filled 2021-08-05: qty 100

## 2021-08-05 MED ORDER — PROTAMINE SULFATE 10 MG/ML IV SOLN
INTRAVENOUS | Status: DC | PRN
Start: 2021-08-05 — End: 2021-08-05
  Administered 2021-08-05: 240 mg via INTRAVENOUS

## 2021-08-05 MED ORDER — HEMOSTATIC AGENTS (NO CHARGE) OPTIME
TOPICAL | Status: DC | PRN
  Administered 2021-08-05: 1 via TOPICAL

## 2021-08-05 MED ORDER — INSULIN REGULAR(HUMAN) IN NACL 100-0.9 UT/100ML-% IV SOLN: INTRAVENOUS | Status: DC

## 2021-08-05 MED ORDER — PROPOFOL 10 MG/ML IV BOLUS
INTRAVENOUS | Status: AC
  Filled 2021-08-05: qty 20

## 2021-08-05 MED ORDER — CHLORHEXIDINE GLUCONATE 0.12 % MT SOLN
15.0000 mL | OROMUCOSAL | Status: AC
  Administered 2021-08-05: 15 mL via OROMUCOSAL

## 2021-08-05 MED ORDER — ARTIFICIAL TEARS OPHTHALMIC OINT
TOPICAL_OINTMENT | OPHTHALMIC | Status: AC
  Filled 2021-08-05: qty 3.5

## 2021-08-05 MED ORDER — ACETAMINOPHEN 160 MG/5ML PO SOLN: 650.0000 mg | Freq: Once | ORAL | Status: AC

## 2021-08-05 MED ORDER — MORPHINE SULFATE (PF) 2 MG/ML IV SOLN
1.0000 mg | INTRAVENOUS | Status: DC | PRN
  Administered 2021-08-05 (×2): 2 mg via INTRAVENOUS
  Administered 2021-08-05 – 2021-08-06 (×3): 4 mg via INTRAVENOUS
  Filled 2021-08-05: qty 2
  Filled 2021-08-05 (×2): qty 1
  Filled 2021-08-05 (×2): qty 2

## 2021-08-05 MED ORDER — SODIUM CHLORIDE (PF) 0.9 % IJ SOLN
OROMUCOSAL | Status: DC | PRN
  Administered 2021-08-05 (×3): 4 mL via TOPICAL

## 2021-08-05 MED ORDER — SODIUM CHLORIDE 0.9 % IV SOLN: INTRAVENOUS | Status: DC | PRN

## 2021-08-05 MED ORDER — PHENYLEPHRINE HCL-NACL 20-0.9 MG/250ML-% IV SOLN
0.0000 ug/min | INTRAVENOUS | Status: DC
  Administered 2021-08-05: 20 ug/min via INTRAVENOUS
  Filled 2021-08-05: qty 250

## 2021-08-05 MED ORDER — LACTATED RINGERS IV SOLN: INTRAVENOUS | Status: DC

## 2021-08-05 MED ORDER — LIDOCAINE 2% (20 MG/ML) 5 ML SYRINGE
INTRAMUSCULAR | Status: AC
  Filled 2021-08-05: qty 5

## 2021-08-05 MED ORDER — CHLORHEXIDINE GLUCONATE 0.12% ORAL RINSE (MEDLINE KIT)
15.0000 mL | Freq: Two times a day (BID) | OROMUCOSAL | Status: DC
  Administered 2021-08-05: 15 mL via OROMUCOSAL

## 2021-08-05 MED ORDER — PROPOFOL 10 MG/ML IV BOLUS
INTRAVENOUS | Status: DC | PRN
Start: 2021-08-05 — End: 2021-08-05
  Administered 2021-08-05 (×2): 50 mg via INTRAVENOUS

## 2021-08-05 MED ORDER — DOCUSATE SODIUM 100 MG PO CAPS
200.0000 mg | ORAL_CAPSULE | Freq: Every day | ORAL | Status: DC
  Administered 2021-08-06 – 2021-08-08 (×3): 200 mg via ORAL
  Filled 2021-08-05 (×5): qty 2

## 2021-08-05 MED ORDER — SODIUM CHLORIDE 0.9% FLUSH
3.0000 mL | Freq: Two times a day (BID) | INTRAVENOUS | Status: DC
  Administered 2021-08-06 – 2021-08-08 (×6): 3 mL via INTRAVENOUS

## 2021-08-05 MED ORDER — DEXTROSE 50 % IV SOLN: 0.0000 mL | INTRAVENOUS | Status: DC | PRN

## 2021-08-05 MED ORDER — ACETAMINOPHEN 500 MG PO TABS
1000.0000 mg | ORAL_TABLET | Freq: Four times a day (QID) | ORAL | Status: AC
  Administered 2021-08-06 – 2021-08-10 (×6): 1000 mg via ORAL
  Filled 2021-08-05 (×8): qty 2

## 2021-08-05 MED ORDER — ONDANSETRON HCL 4 MG/2ML IJ SOLN
4.0000 mg | Freq: Four times a day (QID) | INTRAMUSCULAR | Status: DC | PRN
  Administered 2021-08-05 – 2021-08-11 (×5): 4 mg via INTRAVENOUS
  Filled 2021-08-05 (×5): qty 2

## 2021-08-05 MED ORDER — DEXMEDETOMIDINE HCL IN NACL 400 MCG/100ML IV SOLN
0.0000 ug/kg/h | INTRAVENOUS | Status: DC
  Administered 2021-08-05: 0.7 ug/kg/h via INTRAVENOUS
  Administered 2021-08-05: 0.6 ug/kg/h via INTRAVENOUS
  Filled 2021-08-05: qty 100

## 2021-08-05 MED ORDER — FENTANYL CITRATE (PF) 250 MCG/5ML IJ SOLN
INTRAMUSCULAR | Status: DC | PRN
  Administered 2021-08-05 (×2): 150 ug via INTRAVENOUS
  Administered 2021-08-05 (×2): 100 ug via INTRAVENOUS
  Administered 2021-08-05: 50 ug via INTRAVENOUS
  Administered 2021-08-05: 150 ug via INTRAVENOUS
  Administered 2021-08-05: 100 ug via INTRAVENOUS
  Administered 2021-08-05 (×2): 150 ug via INTRAVENOUS
  Administered 2021-08-05: 50 ug via INTRAVENOUS
  Administered 2021-08-05: 100 ug via INTRAVENOUS

## 2021-08-05 MED ORDER — SODIUM CHLORIDE 0.9% FLUSH: 3.0000 mL | INTRAVENOUS | Status: DC | PRN

## 2021-08-05 MED ORDER — ASPIRIN EC 325 MG PO TBEC
325.0000 mg | DELAYED_RELEASE_TABLET | Freq: Every day | ORAL | Status: DC
  Administered 2021-08-06 – 2021-08-09 (×4): 325 mg via ORAL
  Filled 2021-08-05 (×4): qty 1

## 2021-08-05 MED ORDER — CEFAZOLIN SODIUM-DEXTROSE 2-4 GM/100ML-% IV SOLN
2.0000 g | Freq: Three times a day (TID) | INTRAVENOUS | Status: AC
  Administered 2021-08-05 – 2021-08-07 (×6): 2 g via INTRAVENOUS
  Filled 2021-08-05 (×6): qty 100

## 2021-08-05 MED ORDER — ONDANSETRON HCL 4 MG/2ML IJ SOLN
INTRAMUSCULAR | Status: AC
  Filled 2021-08-05: qty 2

## 2021-08-05 MED ORDER — CHLORHEXIDINE GLUCONATE CLOTH 2 % EX PADS
6.0000 | MEDICATED_PAD | Freq: Every day | CUTANEOUS | Status: DC
  Administered 2021-08-05 – 2021-08-08 (×4): 6 via TOPICAL

## 2021-08-05 MED ORDER — SODIUM CHLORIDE 0.45 % IV SOLN: INTRAVENOUS | Status: DC | PRN

## 2021-08-05 MED ORDER — PLASMA-LYTE A IV SOLN: INTRAVENOUS | Status: DC | PRN

## 2021-08-05 MED ORDER — ARTIFICIAL TEARS OPHTHALMIC OINT
TOPICAL_OINTMENT | OPHTHALMIC | Status: DC | PRN
  Administered 2021-08-05: 1 via OPHTHALMIC

## 2021-08-05 MED ORDER — FAMOTIDINE IN NACL 20-0.9 MG/50ML-% IV SOLN
20.0000 mg | Freq: Two times a day (BID) | INTRAVENOUS | Status: AC
  Administered 2021-08-05 (×2): 20 mg via INTRAVENOUS
  Filled 2021-08-05 (×2): qty 50

## 2021-08-05 MED ORDER — ROCURONIUM BROMIDE 10 MG/ML (PF) SYRINGE
PREFILLED_SYRINGE | INTRAVENOUS | Status: AC
  Filled 2021-08-05: qty 20

## 2021-08-05 MED ORDER — DEXAMETHASONE SODIUM PHOSPHATE 10 MG/ML IJ SOLN
INTRAMUSCULAR | Status: AC
  Filled 2021-08-05: qty 1

## 2021-08-05 MED ORDER — METOPROLOL TARTRATE 12.5 MG HALF TABLET: 12.5000 mg | ORAL_TABLET | Freq: Two times a day (BID) | ORAL | Status: DC

## 2021-08-05 MED ORDER — HEPARIN SODIUM (PORCINE) 1000 UNIT/ML IJ SOLN
INTRAMUSCULAR | Status: DC | PRN
Start: 2021-08-05 — End: 2021-08-05
  Administered 2021-08-05: 24000 [IU] via INTRAVENOUS

## 2021-08-05 SURGICAL SUPPLY — 100 items
BAG DECANTER FOR FLEXI CONT (MISCELLANEOUS) ×4 IMPLANT
BLADE CLIPPER SURG (BLADE) ×4 IMPLANT
BLADE STERNUM SYSTEM 6 (BLADE) ×4 IMPLANT
BNDG ELASTIC 4X5.8 VLCR STR LF (GAUZE/BANDAGES/DRESSINGS) ×4 IMPLANT
BNDG ELASTIC 6X5.8 VLCR STR LF (GAUZE/BANDAGES/DRESSINGS) ×4 IMPLANT
BNDG GAUZE ELAST 4 BULKY (GAUZE/BANDAGES/DRESSINGS) ×4 IMPLANT
CANISTER SUCT 3000ML PPV (MISCELLANEOUS) ×4 IMPLANT
CATH THORACIC 28FR (CATHETERS) ×4 IMPLANT
CATH THORACIC 36FR (CATHETERS) ×4 IMPLANT
CATH THORACIC 36FR RT ANG (CATHETERS) ×4 IMPLANT
CLIP VESOCCLUDE MED 24/CT (CLIP) IMPLANT
CLIP VESOCCLUDE SM WIDE 24/CT (CLIP) IMPLANT
CONTAINER PROTECT SURGISLUSH (MISCELLANEOUS) ×8 IMPLANT
DEFOGGER ANTIFOG KIT (MISCELLANEOUS) ×1 IMPLANT
DERMABOND ADVANCED (GAUZE/BANDAGES/DRESSINGS) ×1
DERMABOND ADVANCED .7 DNX12 (GAUZE/BANDAGES/DRESSINGS) IMPLANT
DRAPE CARDIOVASCULAR INCISE (DRAPES) ×1
DRAPE SRG 135X102X78XABS (DRAPES) ×3 IMPLANT
DRAPE WARM FLUID 44X44 (DRAPES) ×4 IMPLANT
DRSG COVADERM 4X14 (GAUZE/BANDAGES/DRESSINGS) ×4 IMPLANT
ELECT CAUTERY BLADE 6.4 (BLADE) ×4 IMPLANT
ELECT REM PT RETURN 9FT ADLT (ELECTROSURGICAL) ×8
ELECTRODE REM PT RTRN 9FT ADLT (ELECTROSURGICAL) ×6 IMPLANT
FELT TEFLON 1X6 (MISCELLANEOUS) ×8 IMPLANT
GAUZE 4X4 16PLY ~~LOC~~+RFID DBL (SPONGE) ×4 IMPLANT
GAUZE SPONGE 4X4 12PLY STRL (GAUZE/BANDAGES/DRESSINGS) ×8 IMPLANT
GLOVE SURG ENC MOIS LTX SZ6 (GLOVE) IMPLANT
GLOVE SURG ENC MOIS LTX SZ6.5 (GLOVE) IMPLANT
GLOVE SURG ENC MOIS LTX SZ7 (GLOVE) IMPLANT
GLOVE SURG ENC MOIS LTX SZ7.5 (GLOVE) IMPLANT
GLOVE SURG MICRO LTX SZ7 (GLOVE) ×8 IMPLANT
GLOVE SURG ORTHO LTX SZ7.5 (GLOVE) IMPLANT
GLOVE SURG UNDER POLY LF SZ6 (GLOVE) IMPLANT
GLOVE SURG UNDER POLY LF SZ6.5 (GLOVE) IMPLANT
GLOVE SURG UNDER POLY LF SZ7 (GLOVE) IMPLANT
GOWN STRL REUS W/ TWL LRG LVL3 (GOWN DISPOSABLE) ×12 IMPLANT
GOWN STRL REUS W/ TWL XL LVL3 (GOWN DISPOSABLE) ×3 IMPLANT
GOWN STRL REUS W/TWL LRG LVL3 (GOWN DISPOSABLE) ×4
GOWN STRL REUS W/TWL XL LVL3 (GOWN DISPOSABLE) ×1
HEMOSTAT POWDER SURGIFOAM 1G (HEMOSTASIS) ×12 IMPLANT
HEMOSTAT SURGICEL 2X14 (HEMOSTASIS) ×4 IMPLANT
INSERT FOGARTY 61MM (MISCELLANEOUS) IMPLANT
INSERT FOGARTY XLG (MISCELLANEOUS) IMPLANT
KIT BASIN OR (CUSTOM PROCEDURE TRAY) ×4 IMPLANT
KIT CATH CPB BARTLE (MISCELLANEOUS) ×4 IMPLANT
KIT SUCTION CATH 14FR (SUCTIONS) ×4 IMPLANT
KIT TURNOVER KIT B (KITS) ×4 IMPLANT
KIT VASOVIEW HEMOPRO 2 VH 4000 (KITS) ×4 IMPLANT
NS IRRIG 1000ML POUR BTL (IV SOLUTION) ×20 IMPLANT
PACK E OPEN HEART (SUTURE) ×4 IMPLANT
PACK OPEN HEART (CUSTOM PROCEDURE TRAY) ×4 IMPLANT
PAD ARMBOARD 7.5X6 YLW CONV (MISCELLANEOUS) ×8 IMPLANT
PAD ELECT DEFIB RADIOL ZOLL (MISCELLANEOUS) ×4 IMPLANT
PENCIL BUTTON HOLSTER BLD 10FT (ELECTRODE) ×4 IMPLANT
POSITIONER HEAD DONUT 9IN (MISCELLANEOUS) ×4 IMPLANT
PUNCH AORTIC ROTATE 4.0MM (MISCELLANEOUS) IMPLANT
PUNCH AORTIC ROTATE 4.5MM 8IN (MISCELLANEOUS) ×5 IMPLANT
PUNCH AORTIC ROTATE 5MM 8IN (MISCELLANEOUS) IMPLANT
SET MPS 3-ND DEL (MISCELLANEOUS) ×1 IMPLANT
SPONGE INTESTINAL PEANUT (DISPOSABLE) IMPLANT
SPONGE T-LAP 18X18 ~~LOC~~+RFID (SPONGE) ×16 IMPLANT
SPONGE T-LAP 4X18 ~~LOC~~+RFID (SPONGE) ×8 IMPLANT
SUPPORT HEART JANKE-BARRON (MISCELLANEOUS) ×4 IMPLANT
SUT BONE WAX W31G (SUTURE) ×4 IMPLANT
SUT MNCRL AB 4-0 PS2 18 (SUTURE) ×1 IMPLANT
SUT PROLENE 3 0 SH DA (SUTURE) IMPLANT
SUT PROLENE 3 0 SH1 36 (SUTURE) ×4 IMPLANT
SUT PROLENE 4 0 RB 1 (SUTURE)
SUT PROLENE 4 0 SH DA (SUTURE) IMPLANT
SUT PROLENE 4-0 RB1 .5 CRCL 36 (SUTURE) IMPLANT
SUT PROLENE 5 0 C 1 36 (SUTURE) IMPLANT
SUT PROLENE 6 0 C 1 30 (SUTURE) ×1 IMPLANT
SUT PROLENE 7 0 BV 1 (SUTURE) IMPLANT
SUT PROLENE 7 0 BV1 MDA (SUTURE) ×5 IMPLANT
SUT PROLENE 8 0 BV175 6 (SUTURE) ×2 IMPLANT
SUT SILK  1 MH (SUTURE)
SUT SILK 1 MH (SUTURE) IMPLANT
SUT SILK 2 0 SH (SUTURE) IMPLANT
SUT STEEL 6MS V (SUTURE) ×2 IMPLANT
SUT STEEL STERNAL CCS#1 18IN (SUTURE) IMPLANT
SUT STEEL SZ 6 DBL 3X14 BALL (SUTURE) IMPLANT
SUT VIC AB 1 CTX 36 (SUTURE) ×2
SUT VIC AB 1 CTX36XBRD ANBCTR (SUTURE) ×6 IMPLANT
SUT VIC AB 2-0 CT1 27 (SUTURE) ×1
SUT VIC AB 2-0 CT1 TAPERPNT 27 (SUTURE) IMPLANT
SUT VIC AB 2-0 CTX 27 (SUTURE) IMPLANT
SUT VIC AB 3-0 SH 27 (SUTURE)
SUT VIC AB 3-0 SH 27X BRD (SUTURE) IMPLANT
SUT VIC AB 3-0 X1 27 (SUTURE) IMPLANT
SUT VICRYL 4-0 PS2 18IN ABS (SUTURE) IMPLANT
SYSTEM SAHARA CHEST DRAIN ATS (WOUND CARE) ×4 IMPLANT
TAPE CLOTH SURG 4X10 WHT LF (GAUZE/BANDAGES/DRESSINGS) ×2 IMPLANT
TAPE PAPER 2X10 WHT MICROPORE (GAUZE/BANDAGES/DRESSINGS) ×1 IMPLANT
TOWEL GREEN STERILE (TOWEL DISPOSABLE) ×4 IMPLANT
TOWEL GREEN STERILE FF (TOWEL DISPOSABLE) ×4 IMPLANT
TRAY FOLEY SLVR 16FR TEMP STAT (SET/KITS/TRAYS/PACK) ×4 IMPLANT
TUBE SUCTION CARDIAC 10FR (CANNULA) ×1 IMPLANT
TUBING LAP HI FLOW INSUFFLATIO (TUBING) ×4 IMPLANT
UNDERPAD 30X36 HEAVY ABSORB (UNDERPADS AND DIAPERS) ×4 IMPLANT
WATER STERILE IRR 1000ML POUR (IV SOLUTION) ×8 IMPLANT

## 2021-08-05 NOTE — Procedures (Signed)
Extubation Procedure Note  Patient Details:   Name: Tiffany Odonnell DOB: 1951/01/16 MRN: [id number]   Airway Documentation:    Vent end date: 08/05/21 Vent end time: 1942   Evaluation  O2 sats: stable throughout Complications: No apparent complications Patient did tolerate procedure well. Bilateral Breath Sounds: Clear   Yes Pt extubated to 4L . + cuff leak noted. NIF -20 VC .80.  Roanna Raider 08/05/2021, 7:49 PM

## 2021-08-05 NOTE — Anesthesia Procedure Notes (Signed)
Procedure Name: Intubation Date/Time: 08/05/2021 7:43 AM Performed by: Lance Coon, CRNA Pre-anesthesia Checklist: Patient identified, Suction available, Emergency Drugs available, Patient being monitored and Timeout performed Patient Re-evaluated:Patient Re-evaluated prior to induction Oxygen Delivery Method: Circle system utilized Preoxygenation: Pre-oxygenation with 100% oxygen Induction Type: IV induction Ventilation: Mask ventilation without difficulty Laryngoscope Size: Miller and 2 Grade View: Grade I Tube type: Oral Tube size: 7.5 mm Number of attempts: 1 Airway Equipment and Method: Stylet Placement Confirmation: ETT inserted through vocal cords under direct vision, positive ETCO2 and breath sounds checked- equal and bilateral Secured at: 21 cm Tube secured with: Tape Dental Injury: Teeth and Oropharynx as per pre-operative assessment  Difficulty Due To: Difficulty was unanticipated

## 2021-08-05 NOTE — Transfer of Care (Signed)
Immediate Anesthesia Transfer of Care Note  Patient: Varney Daily  Procedure(s) Performed: CORONARY ARTERY BYPASS GRAFTING (CABG) TIMES THREE, USING LEFT INTERNAL MAMMARTY ARTERY AND ENDOSCOPICALLY HARVESTED RIGHT GREATER SAPHENOUS VEIN (Chest) TRANSESOPHAGEAL ECHOCARDIOGRAM (TEE) ENDOVEIN HARVEST OF GREATER SAPHENOUS VEIN (Right: Leg Lower)  Patient Location: ICU  Anesthesia Type:General  Level of Consciousness: patient cooperative and Patient remains intubated per anesthesia plan  Airway & Oxygen Therapy: Patient remains intubated per anesthesia plan and Patient placed on Ventilator (see vital sign flow sheet for setting)  Post-op Assessment: Report given to RN and Post -op Vital signs reviewed and stable  Post vital signs: Reviewed and stable  Last Vitals:  Vitals Value Taken Time  BP    Temp 35.2 C 08/05/21 1209  Pulse 72 08/05/21 1209  Resp 8 08/05/21 1209  SpO2 96 % 08/05/21 1209  Vitals shown include unvalidated device data.  Last Pain:  Vitals:   08/05/21 0515  TempSrc: Oral  PainSc:       Patients Stated Pain Goal: 0 (08/02/21 2050)  Complications: No notable events documented.

## 2021-08-05 NOTE — Interval H&P Note (Signed)
History and Physical Interval Note:  08/05/2021 7:25 AM  Tiffany Odonnell  has presented today for surgery, with the diagnosis of CAD.  The various methods of treatment have been discussed with the patient and family. After consideration of risks, benefits and other options for treatment, the patient has consented to  Procedure(s): CORONARY ARTERY BYPASS GRAFTING (CABG) (N/A) TRANSESOPHAGEAL ECHOCARDIOGRAM (TEE) (N/A) as a surgical intervention.  The patient's history has been reviewed, patient examined, no change in status, stable for surgery.  I have reviewed the patient's chart and labs.  Questions were answered to the patient's satisfaction.     Gaye Pollack

## 2021-08-05 NOTE — Progress Notes (Signed)
EVENING ROUNDS NOTE :     [address]       [place] [address]             [phone]                 Day of Surgery Procedure(s) (LRB): CORONARY ARTERY BYPASS GRAFTING (CABG) TIMES THREE, USING LEFT INTERNAL MAMMARTY ARTERY AND ENDOSCOPICALLY HARVESTED RIGHT GREATER SAPHENOUS VEIN (N/A) TRANSESOPHAGEAL ECHOCARDIOGRAM (TEE) (N/A) ENDOVEIN HARVEST OF GREATER SAPHENOUS VEIN (Right)   Total Length of Stay:  LOS: 4 days  Events:   Low T output Extubated HD stable    BP 123/66    Pulse 93    Temp 98.2 F (36.8 C)    Resp 13    Ht 5\' 1"  (1.549 m)    Wt 68.9 kg    SpO2 100%    BMI 28.68 kg/m   PAP: (20-40)/(10-24) 22/11 CO:  [2.9 L/min-5.8 L/min] 5.1 L/min CI:  [1.7 L/min/m2-3.5 L/min/m2] 3.1 L/min/m2  Vent Mode: PSV;CPAP FiO2 (%):  [40 %-50 %] 40 % Set Rate:  [4 bmp-18 bmp] 4 bmp Vt Set:  [380 mL-450 mL] 450 mL PEEP:  [5 cmH20] 5 cmH20 Pressure Support:  [10 cmH20] 10 cmH20 Plateau Pressure:  [13 cmH20-17 cmH20] 17 cmH20   sodium chloride 10 mL/hr at 08/05/21 1800   [START ON 08/06/2021] sodium chloride     sodium chloride      ceFAZolin (ANCEF) IV Stopped (08/05/21 1338)   dexmedetomidine (PRECEDEX) IV infusion Stopped (08/05/21 1640)   famotidine (PEPCID) IV Stopped (08/05/21 1321)   insulin 1.5 Units/hr (08/05/21 1800)   lactated ringers     lactated ringers     lactated ringers 20 mL/hr at 08/05/21 1800   nitroGLYCERIN Stopped (08/05/21 1600)   phenylephrine (NEO-SYNEPHRINE) Adult infusion 20 mcg/min (08/05/21 1933)    I/O last 3 completed shifts: In: 4133.4 [P.O.:540; I.V.:2214.6; Blood:250; IV Piggyback:1128.7] Out: 5120 [Urine:4820; Blood:150; Chest Tube:150]   CBC Latest Ref Rng & Units 08/05/2021 08/05/2021 08/05/2021  WBC 4.0 - 10.5 K/uL - - 13.4(H)  Hemoglobin 12.0 - 15.0 g/dL 8.8(L) 9.5(L) 9.2(L)  Hematocrit 36.0 - 46.0 % 26.0(L) 28.0(L) 27.1(L)  Platelets 150 - 400 K/uL - - 155    BMP Latest Ref Rng & Units 08/05/2021 08/05/2021 08/05/2021   Glucose 70 - 99 mg/dL - - 125(H)  BUN 8 - 23 mg/dL - - 17  Creatinine 0.44 - 1.00 mg/dL - - 0.97  Sodium 135 - 145 mmol/L 139 141 138  Potassium 3.5 - 5.1 mmol/L 4.6 4.7 4.6  Chloride 98 - 111 mmol/L - - 109  CO2 22 - 32 mmol/L - - 22  Calcium 8.9 - 10.3 mg/dL - - 7.6(L)    ABG    Component Value Date/Time   PHART 7.366 08/05/2021 1916   PCO2ART 40.4 08/05/2021 1916   PO2ART 133 (H) 08/05/2021 1916   HCO3 23.2 08/05/2021 1916   TCO2 24 08/05/2021 1916   ACIDBASEDEF 2.0 08/05/2021 1916   O2SAT 99.0 08/05/2021 1916       Melodie Bouillon, MD 08/05/2021 8:30 PM

## 2021-08-05 NOTE — Progress Notes (Signed)
°  Echocardiogram Echocardiogram Transesophageal has been performed.  Tiffany Odonnell 08/05/2021, 8:29 AM

## 2021-08-05 NOTE — Hospital Course (Addendum)
Referring: [facility] Primary Care: Beverley Fiedler, FNP Primary Cardiologist:Kenneth Wells Guiles, MD  History of the present illness:    At time of consultation   Tiffany Odonnell is a 71 yo female with known history of DM, HLD, HTN, Neuropathy, Sjogrens Syndrome, Lichen planus, anxiety.  The patient also has a history kidney stones with recent procedure performed in January. The patient developed complaints of chest pain on 1/30 in the morning.  The pain was described as a heaviness and burning sensation.  She checked her blood sugar which was normal at 119.  She states the pain did not resolve and she instructed her grand-daughter to call EMS.  On arrival she was hypertensive with SBP of 200.  EKG was also concerning.  She was treated with ASA and NTG which relieved her pain.  The patient states her pain had went to her left shoulder and she was associated with cold sweats/diaphoresis, mild shortness of breath.  Troponin levels were elevated and she was ruled in for NSTEMI.  She was admitted and started on Heparin therapy.  It was felt  she would require coronary catheterization which was performed 08/02/2021 which showed multivessel CAD.  It was felt the patient would require coronary bypass procedure and Cardiothoracic surgery consultation was requested.  The patient is currently chest pain free.  She denies nicotine use.  The patient's diabetes is usually medically managed and her preoperative A1c is 5.9.  The patient does state she has problems with balance and dizziness at times.  Patient is concerned she has a UTI.  Her daughter was on the phone and states this has been a chronic issue.  She has been living in the [place] for the past 3 years.  Dr. Cyndia Bent evaluated patient and all relevant studies and has recommended proceeding with CABG as best coronary artery surgical revascularization option.  Hospital course:  Patient was medically stabilized and was felt acceptable to proceed with surgical  intervention on 08/05/2021.  She was taken the operating room and underwent CABG x3.  She tolerated the procedure well and was taken to the surgical intensive care unit in stable condition. He was extubated the evening of surgery. A line, Swan, foley, and chest tubes were removed early in her post operative course. She went into a fib the evening of 02/04 and was put on IV Amiodarone. She was also on oral Lopressor.  This will be changed to atenolol at discharge which she was on previously.  She was volume overloaded and diuresed accordingly. She was transitioned off the Insulin drip. She has a history of DM. Her pre op HGA1C is 5.9.  She is noted to have an expected acute blood loss anemia which is stabilized.  She has been started on iron supplementation.  Most recent hemoglobin hematocrit dated 08/09/2021 are 8.3/25.5.  Incisions are noted to be healing well without evidence of infection.  Oxygen has been weaned and she maintains good saturations on room air.  She is tolerating routine activities commensurate for level of postoperative convalescence using standard cardiac rehab modalities.  At the time of discharge the patient is felt to be quite stable.

## 2021-08-05 NOTE — Brief Op Note (Signed)
08/01/2021 - 08/05/2021  8:11 AM  PATIENT:  Tiffany Odonnell  71 y.o. female  PRE-OPERATIVE DIAGNOSIS:  CORONARY ARTERY DISEASE  POST-OPERATIVE DIAGNOSIS:  CORONARY ARTERY DISEASE  PROCEDURE:  Procedure(s): CORONARY ARTERY BYPASS GRAFTING (CABG) TIMES THREE, USING LEFT INTERNAL MAMMARTY ARTERY AND ENDOSCOPICALLY HARVESTED RIGHT GREATER SAPHENOUS VEIN (N/A) TRANSESOPHAGEAL ECHOCARDIOGRAM (TEE) (N/A) ENDOVEIN HARVEST OF GREATER SAPHENOUS VEIN (Right) LIMA-LAD SVG-DIAG SVG-PDA EVG 45/15 MIN  SURGEON:  Surgeon(s) and Role:    * Bartle, Fernande Boyden, MD - Primary  PHYSICIAN ASSISTANT: Nidya Bouyer PA-C  ASSISTANTS: STAFF   ANESTHESIA:   general  EBL:  150 mL   BLOOD ADMINISTERED:none  DRAINS:  LEFT PLEURAL AND MEDIASTINAL    LOCAL MEDICATIONS USED:  NONE  SPECIMEN:  No Specimen  DISPOSITION OF SPECIMEN:  N/A  COUNTS:  YES  TOURNIQUET:  * No tourniquets in log *  DICTATION: .Dragon Dictation  PLAN OF CARE: Admit to inpatient   PATIENT DISPOSITION:  ICU - intubated and hemodynamically stable.   Delay start of Pharmacological VTE agent (>24hrs) due to surgical blood loss or risk of bleeding: yes  COMPLICATIONS : NO KNOWN

## 2021-08-05 NOTE — Anesthesia Postprocedure Evaluation (Signed)
Anesthesia Post Note  Patient: Banker  Procedure(s) Performed: CORONARY ARTERY BYPASS GRAFTING (CABG) TIMES THREE, USING LEFT INTERNAL MAMMARTY ARTERY AND ENDOSCOPICALLY HARVESTED RIGHT GREATER SAPHENOUS VEIN (Chest) TRANSESOPHAGEAL ECHOCARDIOGRAM (TEE) ENDOVEIN HARVEST OF GREATER SAPHENOUS VEIN (Right: Leg Lower)     Patient location during evaluation: SICU Anesthesia Type: General Level of consciousness: sedated Pain management: pain level controlled Vital Signs Assessment: post-procedure vital signs reviewed and stable Respiratory status: patient remains intubated per anesthesia plan Cardiovascular status: stable Postop Assessment: no apparent nausea or vomiting Anesthetic complications: no   No notable events documented.  Last Vitals:  Vitals:   08/05/21 1415 08/05/21 1430  BP:    Pulse: 76 77  Resp: 18 18  Temp: (!) 35.9 C (!) 36.1 C  SpO2: 100% 100%    Last Pain:  Vitals:   08/05/21 1215  TempSrc: Core (Comment)  PainSc:                  Collene Schlichter

## 2021-08-05 NOTE — Progress Notes (Signed)
Creatinine is 1.14 from 0.90 yesterday.  [facility] on call but no return call. OR desk was inform as well,they advised to mention to CRNA whom I will give report to. Patient has CABG procedure at 0715.

## 2021-08-05 NOTE — Anesthesia Procedure Notes (Signed)
Central Venous Catheter Insertion Performed by: Santa Lighter, MD, anesthesiologist Start/End2/08/2021 7:00 AM, 08/05/2021 7:10 AM Patient location: Pre-op. Preanesthetic checklist: patient identified, IV checked, site marked, risks and benefits discussed, surgical consent, monitors and equipment checked, pre-op evaluation, timeout performed and anesthesia consent Position: Trendelenburg Lidocaine 1% used for infiltration and patient sedated Hand hygiene performed , maximum sterile barriers used  and Seldinger technique used Catheter size: 9 Fr Central line was placed.MAC introducer Procedure performed using ultrasound guided technique. Ultrasound Notes:anatomy identified, needle tip was noted to be adjacent to the nerve/plexus identified, no ultrasound evidence of intravascular and/or intraneural injection and image(s) printed for medical record Attempts: 1 Following insertion, line sutured, dressing applied and Biopatch. Post procedure assessment: free fluid flow, blood return through all ports and no air  Patient tolerated the procedure well with no immediate complications.

## 2021-08-05 NOTE — Op Note (Signed)
CARDIOVASCULAR SURGERY OPERATIVE NOTE  08/05/2021  Surgeon:  Gaye Pollack, MD  First Assistant: Jadene Pierini,  PA-C:   An experienced assistant was required given the complexity of this surgery and the standard of surgical care. The assistant was needed for vein harvesting, exposure, dissection, suctioning, retraction of delicate tissues and sutures, instrument exchange and for overall help during this procedure.   Preoperative Diagnosis:  Severe multi-vessel coronary artery disease   Postoperative Diagnosis:  Same   Procedure:  Median Sternotomy Extracorporeal circulation 3.   Coronary artery bypass grafting x 3  Left internal mammary artery graft to the LAD SVG to diagonal SVG to PDA  4.   Endoscopic vein harvest from the right leg   Anesthesia:  General Endotracheal   Clinical History/Surgical Indication:  The patient is a 71 year old woman with history of diabetes, hypertension, hyperlipidemia who reports starting an exercise program over the past few months to try to get her newly diagnosed diabetes under control.  She was walking on a treadmill and developed some intermittent chest discomfort.  She was also having some exertional fatigue.  Then in January she developed a large right kidney stone and had to undergo right percutaneous nephrostomy tube placement with stenting of her ureter and stopped exercising after that.  She also had undergo banding of some internal hemorrhoids.  She was now admitted with substernal chest pain that she described as a burning sensation with heaviness.  She ruled in for non-ST segment elevation MI.  Cardiac catheterization shows severe two-vessel coronary disease with a complex calcified proximal and mid LAD chronic total occlusion with faint filling of the distal vessel by collaterals from the right.  There was a large diagonal branch that had high-grade  proximal stenosis.  The left circumflex had no significant disease.  The right coronary artery was diffusely diseased with calcific plaque and had a 40 to 50% shelflike proximal stenosis.  Left ventricular ejection fraction was normal by echocardiogram with no significant valvular abnormality.  I agree that coronary bypass graft surgery is the best treatment for her to improve her symptoms and prevent further myocardial damage.  She had a urinalysis since admission that showed large leukocytes and many bacteria.  Culture is growing greater than 100,000 E. coli.  Sensitivities are pending.  She remains afebrile with normal white blood cell count and denies any urinary symptoms at this time.  I suspect this is probably colonization and will be covered by her perioperative antibiotics.  Given the high-grade nature of her coronary disease I think would be best to proceed ahead with coronary bypass surgery. I discussed the operative procedure with the patient including alternatives, benefits and risks; including but not limited to bleeding, blood transfusion, infection, stroke, myocardial infarction, graft failure, heart block requiring a permanent pacemaker, organ dysfunction, and death.  Drue Flirt understands and agrees to proceed.   Preparation:  The patient was seen in the preoperative holding area and the correct patient, correct operation were confirmed with the patient after reviewing the medical record and catheterization. The consent was signed by me. Preoperative antibiotics were given. A pulmonary arterial line and radial arterial line were placed by the anesthesia team. The patient was taken back to the operating room and positioned supine on the operating room table. After being placed under general endotracheal anesthesia by the anesthesia team a foley catheter was placed. The neck, chest, abdomen, and both legs were prepped with betadine soap and solution and draped in the usual sterile manner.  A  surgical time-out was taken and the correct patient and operative procedure were confirmed with the nursing and anesthesia staff.   Cardiopulmonary Bypass:  A median sternotomy was performed. The pericardium was opened in the midline. Right ventricular function appeared normal. The ascending aorta was of normal size and had no palpable plaque. There were no contraindications to aortic cannulation or cross-clamping. The patient was fully systemically heparinized and the ACT was maintained > 400 sec. The proximal aortic arch was cannulated with a 20 F aortic cannula for arterial inflow. Venous cannulation was performed via the right atrial appendage using a two-staged venous cannula. An antegrade cardioplegia/vent cannula was inserted into the mid-ascending aorta. Aortic occlusion was performed with a single cross-clamp. Systemic cooling to 32 degrees Centigrade and topical cooling of the heart with iced saline were used. Hyperkalemic antegrade cold blood cardioplegia was used to induce diastolic arrest and was then given at about 20 minute intervals throughout the period of arrest to maintain myocardial temperature at or below 10 degrees centigrade. A temperature probe was inserted into the interventricular septum and an insulating pad was placed in the pericardium.   Left internal mammary artery harvest:  The left side of the sternum was retracted using the Rultract retractor. The left internal mammary artery was harvested as a pedicle graft. All side branches were clipped. It was a medium-sized vessel of good quality with excellent blood flow. It was ligated distally and divided. It was sprayed with topical papaverine solution to prevent vasospasm.   Endoscopic vein harvest:  The right greater saphenous vein was harvested endoscopically through a 2 cm incision medial to the right knee. It was harvested from the upper thigh to below the knee. It was a medium-sized vein of good quality. The side  branches were all ligated with 4-0 silk ties.    Coronary arteries:  The coronary arteries were examined.  LAD:  Better vessel than I expected based on the cath. It was large caliber with minimal mid and distal disease. The diagonal was a medium caliber vessel with no distal disease. LCX:  No plaque seen and no stenoses on cath. RCA:  Diffusely diseased with calcific plaque. The PDA was diseased at its origin but the remainder of the vessel was large with no disease.    Grafts:  LIMA to the LAD: 2.5 mm. It was sewn end to side using 8-0 prolene continuous suture. SVG to diagonal:  1.75 mm. It was sewn end to side using 7-0 prolene continuous suture. SVG to PDA:  1.75 mm. It was sewn endd to side using 7-0 prolene continuous suture.  The proximal vein graft anastomoses were performed to the mid-ascending aorta using continuous 6-0 prolene suture. Graft markers were placed around the proximal anastomoses.   Completion:  The patient was rewarmed to 37 degrees Centigrade. The clamp was removed from the LIMA pedicle and there was rapid warming of the septum and return of ventricular fibrillation. The crossclamp was removed with a time of 61 minutes. There was spontaneous return of sinus rhythm. The distal and proximal anastomoses were checked for hemostasis. The position of the grafts was satisfactory. Two temporary epicardial pacing wires were placed on the right atrium and two on the right ventricle. The patient was weaned from CPB without difficulty on no inotropes. CPB time was 76 minutes. Cardiac output was 5 LPM. TEE showed normal LV systolic function. Heparin was fully reversed with protamine and the aortic and venous cannulas removed. Hemostasis was achieved. Mediastinal  and left pleural drainage tubes were placed. The sternum was closed with #6 stainless steel wires. The fascia was closed with continuous # 1 vicryl suture. The subcutaneous tissue was closed with 2-0 vicryl continuous  suture. The skin was closed with 3-0 vicryl subcuticular suture. All sponge, needle, and instrument counts were reported correct at the end of the case. Dry sterile dressings were placed over the incisions and around the chest tubes which were connected to pleurevac suction. The patient was then transported to the surgical intensive care unit in  stable condition.

## 2021-08-05 NOTE — Anesthesia Procedure Notes (Signed)
Central Venous Catheter Insertion Performed by: Collene Schlichter, MD, anesthesiologist Start/End2/08/2021 7:10 AM, 08/05/2021 7:15 AM Patient location: Pre-op. Preanesthetic checklist: patient identified, IV checked, site marked, risks and benefits discussed, surgical consent, monitors and equipment checked, pre-op evaluation and timeout performed Position: Trendelenburg Hand hygiene performed  and maximum sterile barriers used  Total catheter length 100. PA cath was placed.Swan type:thermodilution PA Cath depth:45 Procedure performed without using ultrasound guided technique. Attempts: 1 Patient tolerated the procedure well with no immediate complications.

## 2021-08-05 NOTE — Discharge Summary (Signed)
[address]       [place] [address]             [phone]    Physician Discharge Summary  Patient ID: Tiffany Odonnell MRN: [id number] DOB/AGE: September 29, 1950 71 y.o.  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  The patient also has a history kidney stones with recent procedure performed in January. The patient developed complaints of chest pain on 1/30 in the morning.  The pain was described as a heaviness and burning sensation.  She checked her blood sugar which was normal at 119.  She states the pain did not resolve and she instructed her grand-daughter to call EMS.  On arrival she was hypertensive with SBP of 200.  EKG was also concerning.  She was treated with ASA and NTG which relieved her pain.  The patient states her pain had went to her left shoulder and she was associated with cold sweats/diaphoresis, mild  shortness of breath.  Troponin levels were elevated and she was ruled in for NSTEMI.  She was admitted and started on Heparin therapy.  It was felt  she would require coronary catheterization which was performed 08/02/2021 which showed multivessel CAD.  It was felt the patient would require coronary bypass procedure and Cardiothoracic surgery consultation was requested.  The patient is currently chest pain free.  She denies nicotine use.  The patient's diabetes is usually medically managed and her preoperative A1c is 5.9.  The patient does state she has problems with balance and dizziness at times.  Patient is concerned she has a UTI.  Her daughter was on the phone and states this has been a chronic issue.  She has been living in the [place] for the past 3 years.  Dr. Cyndia Bent evaluated patient and all relevant studies and has recommended proceeding with CABG as best coronary artery surgical revascularization option.  Hospital course:  Patient was medically stabilized and was felt acceptable to proceed with surgical intervention on 08/05/2021.  She was taken the operating room and underwent CABG x3.  She tolerated the procedure well and was taken to the surgical intensive care unit in stable condition. He was extubated the evening of surgery. A line, Swan, foley, and chest tubes were removed early in her post operative course. She went into a fib the evening of 02/04 and was put on IV Amiodarone. She was also  on oral Lopressor.  This will be changed to atenolol at discharge which she was on previously.  She was volume overloaded and diuresed accordingly. She was transitioned off the Insulin drip. She has a history of DM. Her pre op HGA1C is 5.9.  She is noted to have an expected acute blood loss anemia which is stabilized.  She has been started on iron supplementation.  Most recent hemoglobin hematocrit dated 08/09/2021 are 8.3/25.5.  Incisions are noted to be healing well without evidence of infection.  Oxygen has been  weaned and she maintains good saturations on room air.  She is tolerating routine activities commensurate for level of postoperative convalescence using standard cardiac rehab modalities.  At the time of discharge the patient is felt to be quite stable.    Consults: None  Significant Diagnostic Studies:  CT ABDOMEN WO CONTRAST  Result Date: 07/16/2021 CLINICAL DATA:  Renal stones EXAM: CT ABDOMEN WITHOUT CONTRAST TECHNIQUE: Multidetector CT imaging of the abdomen was performed following the standard protocol without IV contrast. RADIATION DOSE REDUCTION: This exam was performed according to the departmental dose-optimization program which includes automated exposure control, adjustment of the mA and/or kV according to patient size and/or use of iterative reconstruction technique. COMPARISON:  03/14/2021 FINDINGS: Lower chest: Linear infiltrates are seen in the lower lung fields with air bronchogram. This may suggest subsegmental atelectasis. Coronary artery calcifications are seen. Hepatobiliary: There is 8 mm low-density in the left lobe of liver which has not changed significantly. Gallbladder is distended. There is no wall thickening. There is no dilation of bile ducts. Pancreas: No focal abnormality is seen Spleen: Unremarkable Adrenals/Urinary Tract: Adrenals are unremarkable. There is no hydronephrosis. Percutaneous nephrostomy catheter is seen in the right kidney. Right ureteral stent is noted. There are few small right renal stones each measuring less than 3 mm. In the previous study, a large calculus was seen in the lower pole of right kidney which is not evident in the current study, possibly due to interval lithotripsy. Multiple small calculi seen in the lower pole in the current study may be residual from lithotripsy. There is air in the lumen of collecting system in the right kidney, possibly due to recent intervention. There are few small left renal stones in the mid and lower portions each  measuring less than 3 mm. Ureters are not dilated. There is right perinephric stranding, possibly related to recent intervention. Stomach/Bowel: Stomach is not distended. Visualized small bowel loops are unremarkable. Cecum is not included in the study. There is no significant wall thickening in the visualized portions of colon. Vascular/Lymphatic: Unremarkable. Other: There is no ascites or pneumoperitoneum. Musculoskeletal: There is mild anterolisthesis at L3-L4 level. There is encroachment of neural foramina at L3-L4 and L4-L5 levels. IMPRESSION: Large calculus seen in the lower pole of right kidney is not evident. There are multiple tiny bilateral renal stones. Right percutaneous nephrostomy catheter is noted in place. Right ureteral stent is seen. Right perinephric stranding may be related to recent lithotripsy. There is no hydronephrosis. There are linear densities in both lower lung fields suggesting subsegmental atelectasis. Coronary artery calcifications are seen. There is subcentimeter low-density in the left lobe of liver, possibly cyst or hemangioma. There is mild anterolisthesis at L3-L4 level. There is encroachment of neural foramina at multiple levels in the lumbar spine. Electronically Signed   By: Elmer Picker M.D.   On: 07/16/2021 08:33   DG Chest 2 View  Result Date: 08/09/2021 CLINICAL DATA:  History of CABG EXAM:  CHEST - 2 VIEW COMPARISON:  08/08/2021 FINDINGS: Interval removal of right IJ sheath. Stable heart size status post CABG. Left basilar atelectasis. Overall improving aeration of the lung bases. No pneumothorax. IMPRESSION: Improving aeration of the lung bases. Electronically Signed   By: Davina Poke D.O.   On: 08/09/2021 08:17   CARDIAC CATHETERIZATION  Result Date: 08/03/2021   Prox LAD lesion is 75% stenosed.   Prox LAD to Mid LAD lesion is 100% stenosed.  Right to left collaterals fill the mid to distal LAD.   1st Diag lesion is 95% stenosed.   Prox RCA lesion is  40% stenosed.   Mid RCA lesion is 25% stenosed.   The left ventricular systolic function is normal.   LV end diastolic pressure is normal.   The left ventricular ejection fraction is 55-65% by visual estimate.   There is no aortic valve stenosis. 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  Plan for cardiac surgery consult.   DG Chest Port 1 View  Result Date: 08/08/2021 CLINICAL DATA:  Postop from open heart surgery. EXAM: PORTABLE CHEST 1 VIEW COMPARISON:  08/07/2021 FINDINGS: Left-sided chest tube and mediastinal drains have been removed since prior exam. No pneumothorax visualized. Right jugular Cordis remains in place. Stable mild cardiomegaly. Bibasilar atelectasis shows mild worsening since prior study. IMPRESSION: Postop chest. No pneumothorax visualized. Mild worsening of bibasilar atelectasis. Electronically Signed   By: Marlaine Hind M.D.   On: 08/08/2021 08:13   DG Chest Port 1 View  Result Date: 08/07/2021 CLINICAL DATA:  Follow-up pleural effusion.  Status post CABG. EXAM: PORTABLE CHEST 1 VIEW COMPARISON:  08/06/2021 FINDINGS: Status post median sternotomy and CABG procedure. Pulmonary arterial catheter has been removed. The right IJ Cordis projects over the distal SVC. Stable mediastinal drain and left chest tube without pneumothorax. Small residual pleural effusions identified. Left base atelectasis appears improved from previous exam. IMPRESSION: 1. Small residual pleural effusions. 2. Improved aeration to the left base. Electronically Signed   By: Kerby Moors M.D.   On: 08/07/2021 08:09   DG Chest Port 1 View  Result Date: 08/06/2021 CLINICAL DATA:  Status post coronary artery bypass grafting. EXAM: PORTABLE CHEST 1 VIEW COMPARISON:  Chest radiograph dated August 05, 2021. FINDINGS: Interval removal of the endotracheal tube. Right IJ access swans Ganz catheter with distal tip in the right ventricle outflow tract, unchanged.  Left-sided chest tube and mediastinal drain are unchanged. The heart is enlarged. Low lung volumes. Left basilar atelectasis and/or small effusion. No evidence of pneumothorax. Sternotomy wires are intact. No acute osseous abnormality. IMPRESSION: 1.  Lines and tubes as above. 2. Cardiomegaly with left basilar atelectasis and/small effusion, unchanged. No pneumothorax. Electronically Signed   By: Keane Police D.O.   On: 08/06/2021 08:11   DG Chest Port 1 View  Result Date: 08/05/2021 CLINICAL DATA:  Postop CABG. EXAM: PORTABLE CHEST 1 VIEW COMPARISON:  Radiographs 08/01/2021 and 07/15/2021. FINDINGS: 1220 hours. Interval median sternotomy and CABG. Endotracheal tube tip is 1.6 cm above the carina. Enteric tube projects below the diaphragm, tip not visualized. Right IJ Swan-Ganz catheter terminates in the right ventricular outflow tract or main pulmonary artery. Mediastinal drain and left chest tubes are in place. There is mild left lower lobe atelectasis and a possible small left pleural effusion. No evidence of pneumothorax or edema. IMPRESSION: Interval CABG without demonstrated complication. Mild left lower lobe atelectasis and possible small left pleural effusion. Electronically Signed  By: Richardean Sale M.D.   On: 08/05/2021 12:31   DG Chest Portable 1 View  Result Date: 08/01/2021 CLINICAL DATA:  Chest pain EXAM: PORTABLE CHEST 1 VIEW COMPARISON:  07/15/2021 FINDINGS: The heart size and mediastinal contours are within normal limits. Both lungs are clear. The visualized skeletal structures are unremarkable. IMPRESSION: No acute abnormality of the lungs in AP portable projection. Electronically Signed   By: Delanna Ahmadi M.D.   On: 08/01/2021 13:32   DG Chest Port 1 View  Result Date: 07/15/2021 CLINICAL DATA:  Postop right percutaneous nephrostomy. EXAM: PORTABLE CHEST 1 VIEW COMPARISON:  None. FINDINGS: Heart is normal in size. Patient is rotated to the right. Low lung volumes without focal  consolidation or large pleural effusion. IMPRESSION: Low lung volumes without focal consolidation or pleural effusion. Electronically Signed   By: Keane Police D.O.   On: 07/15/2021 15:37   DG C-Arm 1-60 Min-No Report  Result Date: 07/15/2021 Fluoroscopy was utilized by the requesting physician.  No radiographic interpretation.   DG C-Arm 1-60 Min-No Report  Result Date: 07/15/2021 Fluoroscopy was utilized by the requesting physician.  No radiographic interpretation.   DG C-Arm 1-60 Min-No Report  Result Date: 07/15/2021 Fluoroscopy was utilized by the requesting physician.  No radiographic interpretation.   ECHOCARDIOGRAM COMPLETE  Result Date: 08/02/2021    ECHOCARDIOGRAM REPORT   Patient Name:   Tiffany Odonnell Date of Exam: 08/02/2021 Medical Rec #:  [id number]     Height:       61.0 in Accession #:    [id number]    Weight:       155.0 lb Date of Birth:  Jul 11, 1950     BSA:          1.695 m Patient Age:    68 years      BP:           112/68 mmHg Patient Gender: F             HR:           58 bpm. Exam Location:  Inpatient Procedure: 2D Echo, Color Doppler and Cardiac Doppler Indications:    NSTEMI I21.4  History:        Patient has no prior history of Echocardiogram examinations.                 Risk Factors:Hypertension and Diabetes.  Sonographer:    Bernadene Person RDCS Referring Phys: 61 [place]  1. Left ventricular ejection fraction, by estimation, is 60 to 65%. The left ventricle has normal function. The left ventricle has no regional wall motion abnormalities. Left ventricular diastolic parameters are consistent with Grade I diastolic dysfunction (impaired relaxation).  2. Right ventricular systolic function is normal. The right ventricular size is normal. There is normal pulmonary artery systolic pressure.  3. The mitral valve is normal in structure. Mild mitral valve regurgitation. No evidence of mitral stenosis.  4. The aortic valve is tricuspid. There is mild  calcification of the aortic valve. Aortic valve regurgitation is not visualized. Aortic valve sclerosis is present, with no evidence of aortic valve stenosis.  5. The inferior vena cava is normal in size with greater than 50% respiratory variability, suggesting right atrial pressure of 3 mmHg. FINDINGS  Left Ventricle: Left ventricular ejection fraction, by estimation, is 60 to 65%. The left ventricle has normal function. The left ventricle has no regional wall motion abnormalities. The left ventricular internal cavity size was normal in size. There  is  no left ventricular hypertrophy. Left ventricular diastolic parameters are consistent with Grade I diastolic dysfunction (impaired relaxation). Right Ventricle: The right ventricular size is normal. No increase in right ventricular wall thickness. Right ventricular systolic function is normal. There is normal pulmonary artery systolic pressure. The tricuspid regurgitant velocity is 1.78 m/s, and  with an assumed right atrial pressure of 3 mmHg, the estimated right ventricular systolic pressure is [id number] mmHg. Left Atrium: Left atrial size was normal in size. Right Atrium: Right atrial size was normal in size. Pericardium: There is no evidence of pericardial effusion. Mitral Valve: The mitral valve is normal in structure. Mild mitral valve regurgitation. No evidence of mitral valve stenosis. Tricuspid Valve: The tricuspid valve is normal in structure. Tricuspid valve regurgitation is mild . No evidence of tricuspid stenosis. Aortic Valve: The aortic valve is tricuspid. There is mild calcification of the aortic valve. Aortic valve regurgitation is not visualized. Aortic valve sclerosis is present, with no evidence of aortic valve stenosis. Pulmonic Valve: The pulmonic valve was normal in structure. Pulmonic valve regurgitation is not visualized. No evidence of pulmonic stenosis. Aorta: The aortic root is normal in size and structure. Venous: The inferior vena cava is  normal in size with greater than 50% respiratory variability, suggesting right atrial pressure of 3 mmHg. IAS/Shunts: No atrial level shunt detected by color flow Doppler.  LEFT VENTRICLE PLAX 2D LVIDd:         4.60 cm     Diastology LVIDs:         3.10 cm     LV e' medial:    5.59 cm/s LV PW:         0.90 cm     LV E/e' medial:  12.6 LV IVS:        1.00 cm     LV e' lateral:   7.28 cm/s LVOT diam:     1.80 cm     LV E/e' lateral: 9.7 LV SV:         53 LV SV Index:   31 LVOT Area:     2.54 cm  LV Volumes (MOD) LV vol d, MOD A2C: 81.1 ml LV vol d, MOD A4C: 74.2 ml LV vol s, MOD A2C: 30.5 ml LV vol s, MOD A4C: 28.2 ml LV SV MOD A2C:     50.6 ml LV SV MOD A4C:     74.2 ml LV SV MOD BP:      48.8 ml RIGHT VENTRICLE RV S prime:     9.07 cm/s TAPSE (M-mode): 2.1 cm LEFT ATRIUM           Index        RIGHT ATRIUM           Index LA diam:      3.40 cm 2.01 cm/m   RA Area:     14.70 cm LA Vol (A2C): 31.1 ml 18.35 ml/m  RA Volume:   38.00 ml  22.42 ml/m LA Vol (A4C): 38.1 ml 22.48 ml/m  AORTIC VALVE LVOT Vmax:   87.80 cm/s LVOT Vmean:  59.100 cm/s LVOT VTI:    0.209 m  AORTA Ao Root diam: 3.10 cm Ao Asc diam:  3.40 cm MITRAL VALVE               TRICUSPID VALVE MV Area (PHT): 3.68 cm    TR Peak grad:   12.7 mmHg MV Decel Time: 206 msec    TR Vmax:  178.00 cm/s MV E velocity: 70.70 cm/s MV A velocity: 63.00 cm/s  SHUNTS MV E/A ratio:  1.12        Systemic VTI:  0.21 m                            Systemic Diam: 1.80 cm Candee Furbish MD Electronically signed by Candee Furbish MD Signature Date/Time: 08/02/2021/11:51:16 AM    Final    ECHO INTRAOPERATIVE TEE  Result Date: 08/05/2021  *INTRAOPERATIVE TRANSESOPHAGEAL REPORT *  Patient Name:   Tiffany Odonnell Date of Exam: 08/05/2021 Medical Rec #:  [id number]     Height:       61.0 in Accession #:    [id number]    Weight:       151.8 lb Date of Birth:  05-18-1951     BSA:          1.68 m Patient Age:    31 years      BP:           113/73 mmHg Patient Gender: F             HR:            79 bpm. Exam Location:  Inpatient Transesophogeal exam was perform intraoperatively during surgical procedure. Patient was closely monitored under general anesthesia during the entirety of examination. Indications:     CABG Sonographer:     Clayton Lefort RDCS (AE) Performing Phys: [place] Diagnosing Phys: Hoy Morn MD Complications: No known complications during this procedure. POST-OP IMPRESSIONS _ Left Ventricle: The left ventricle is unchanged from pre-bypass. _ Right Ventricle: The right ventricle appears unchanged from pre-bypass. _ Aorta: The aorta appears unchanged from pre-bypass. _ Left Atrial Appendage: The left atrial appendage appears unchanged from pre-bypass. _ Aortic Valve: The aortic valve appears unchanged from pre-bypass. _ Mitral Valve: There is mild regurgitation. _ Tricuspid Valve: There is mild regurgitation. _ Pulmonic Valve: The pulmonic valve appears unchanged from pre-bypass. _ Interatrial Septum: The interatrial septum appears unchanged from pre-bypass. _ Comments: Post-bypass images reviewed with surgeon. PRE-OP FINDINGS  Left Ventricle: The left ventricle has normal systolic function, with an ejection fraction of 60-65%. The cavity size was normal. Right Ventricle: The right ventricle has normal systolic function. The cavity was normal. There is no increase in right ventricular wall thickness. Left Atrium: Left atrial size was normal in size. No left atrial/left atrial appendage thrombus was detected. Right Atrium: Right atrial size was normal in size. Interatrial Septum: No atrial level shunt detected by color flow Doppler. Pericardium: Trivial pericardial effusion is present. Mitral Valve: The mitral valve is normal in structure. Mitral valve regurgitation is trivial by color flow Doppler. Tricuspid Valve: The tricuspid valve was normal in structure. Tricuspid valve regurgitation is trivial by color flow Doppler. Aortic Valve: The aortic valve is tricuspid Aortic  valve regurgitation was not visualized by color flow Doppler. There is mild calcification present. Pulmonic Valve: The pulmonic valve was normal in structure. Pulmonic valve regurgitation is not visualized by color flow Doppler. Aorta: The aortic root, ascending aorta and aortic arch are normal in size and structure. Pulmonary Artery: The pulmonary artery is of normal size.  Hoy Morn MD Electronically signed by Hoy Morn MD Signature Date/Time: 08/05/2021/2:34:48 PM    Final    IR URETERAL STENT RIGHT NEW ACCESS W/O SEP NEPHROSTOMY CATH  Result Date: 07/15/2021 CLINICAL DATA:  Symptomatic right nephrolithiasis, planned percutaneous nephrolithotomy  EXAM: RIGHT PERCUTANEOUS NEPHROURETERAL CATHETER PLACEMENT UNDER FLUOROSCOPIC GUIDANCE FLUOROSCOPY TIME:  3 minute 30 seconds; 30 mGy TECHNIQUE: The procedure, risks (including but not limited to bleeding, infection, organ damage ), benefits, and alternatives were explained to the patient. Questions regarding the procedure were encouraged and answered. The patient understands and consents to the procedure. Right flank region prepped with Betadine, draped in usual sterile fashion, infiltrated locally with 1% lidocaine. Intravenous Fentanyl 147mcg and Versed 3mg  were administered as conscious sedation during continuous monitoring of the patient's level of consciousness and physiological / cardiorespiratory status by the radiology RN, with a total moderate sedation time of 15 minutes. Under real-time fluoroscopic guidance, a 21-gauge trocar needle was advanced into a posterior lower pole calyx using the peripheral radiodense calculus as a guide. A 018 guidewire advanced easily into the renal collecting system centrally . Needle was exchanged over a guidewire for transitional dilator. Contrast injection confirmed appropriate positioning. Catheter was exchanged over a guidewire for a 5 [place] Kumpe catheter, directed down the ureter and advanced into the urinary  bladder. Radiograph confirms appropriate nephroureteral catheter positioning. Catheter capped and secured externally. The patient tolerated the procedure well. COMPLICATIONS: COMPLICATIONS None. IMPRESSION: 1. Technically successful right antegrade percutaneous nephroureteral catheter placement. Electronically Signed   By: Lucrezia Europe M.D.   On: 07/15/2021 10:40   VAS US DOPPLER PRE CABG  Result Date: 08/04/2021 PREOPERATIVE VASCULAR EVALUATION Patient Name:  Tiffany Odonnell  Date of Exam:   08/03/2021 Medical Rec #: [id number]      Accession #:    [id number] Date of Birth: 05/07/1951      Patient Gender: F Patient Age:   77 years Exam Location:  [facility] Procedure:      VAS US DOPPLER PRE CABG Referring Phys: Gilford Raid --------------------------------------------------------------------------------  Indications:      Pre-CABG. Risk Factors:     Hypertension, hyperlipidemia, Diabetes, prior MI, coronary                   artery disease. Comparison Study: No previous exams Performing Technologist: Hill, Jody RVT, RDMS  Examination Guidelines: A complete evaluation includes B-mode imaging, spectral Doppler, color Doppler, and power Doppler as needed of all accessible portions of each vessel. Bilateral testing is considered an integral part of a complete examination. Limited examinations for reoccurring indications may be performed as noted.  Right Carotid Findings: +----------+--------+--------+--------+--------+------------------+             PSV cm/s EDV cm/s Stenosis Describe Comments            +----------+--------+--------+--------+--------+------------------+  CCA Prox   93       22                                             +----------+--------+--------+--------+--------+------------------+  CCA Distal 62       22                         intimal thickening  +----------+--------+--------+--------+--------+------------------+  ICA Prox   49       20                                              +----------+--------+--------+--------+--------+------------------+  ICA Distal  70       29                                             +----------+--------+--------+--------+--------+------------------+  ECA        51       8                                              +----------+--------+--------+--------+--------+------------------+ +----------+--------+-------+----------------+------------+             PSV cm/s EDV cms Describe         Arm Pressure  +----------+--------+-------+----------------+------------+  Subclavian 118              Multiphasic, WNL               +----------+--------+-------+----------------+------------+ +---------+--------+--+--------+--+---------+  Vertebral PSV cm/s 35 EDV cm/s 12 Antegrade  +---------+--------+--+--------+--+---------+ Left Carotid Findings: +----------+--------+--------+--------+--------+------------------+             PSV cm/s EDV cm/s Stenosis Describe Comments            +----------+--------+--------+--------+--------+------------------+  CCA Prox   95       26                                             +----------+--------+--------+--------+--------+------------------+  CCA Distal 60       21                         intimal thickening  +----------+--------+--------+--------+--------+------------------+  ICA Prox   50       22                                             +----------+--------+--------+--------+--------+------------------+  ICA Distal 63       24                                             +----------+--------+--------+--------+--------+------------------+  ECA        69       13                                             +----------+--------+--------+--------+--------+------------------+ +----------+--------+--------+----------------+------------+  Subclavian PSV cm/s EDV cm/s Describe         Arm Pressure  +----------+--------+--------+----------------+------------+             121               Multiphasic, WNL                +----------+--------+--------+----------------+------------+ +---------+--------+--+--------+--+---------+  Vertebral PSV cm/s 42 EDV cm/s 14 Antegrade  +---------+--------+--+--------+--+---------+  ABI Findings: +---------+------------------+-----+---------+--------+  Right     Rt Pressure (mmHg) Index Waveform  Comment   +---------+------------------+-----+---------+--------+  Brachial  133  triphasic           +---------+------------------+-----+---------+--------+  PTA       174                1.31  triphasic           +---------+------------------+-----+---------+--------+  DP        141                1.06  biphasic            +---------+------------------+-----+---------+--------+  Great Toe 118                0.89  Normal              +---------+------------------+-----+---------+--------+ +---------+------------------+-----+---------+-------+  Left      Lt Pressure (mmHg) Index Waveform  Comment  +---------+------------------+-----+---------+-------+  Brachial  126                      triphasic          +---------+------------------+-----+---------+-------+  PTA       165                1.24  biphasic           +---------+------------------+-----+---------+-------+  DP        153                1.15  biphasic           +---------+------------------+-----+---------+-------+  Great Toe 136                1.02  Normal             +---------+------------------+-----+---------+-------+ +-------+---------------+----------------+  ABI/TBI Today's ABI/TBI Previous ABI/TBI  +-------+---------------+----------------+  Right   1.31 / 0.89                       +-------+---------------+----------------+  Left    1.24 / 1.02                       +-------+---------------+----------------+  Right Doppler Findings: +--------+--------+-----+---------+--------+  Site     Pressure Index Doppler   Comments  +--------+--------+-----+---------+--------+  Brachial 133            triphasic            +--------+--------+-----+---------+--------+  Radial                  triphasic           +--------+--------+-----+---------+--------+  Ulnar                   triphasic           +--------+--------+-----+---------+--------+  Left Doppler Findings: +--------+--------+-----+---------+--------+  Site     Pressure Index Doppler   Comments  +--------+--------+-----+---------+--------+  Brachial 126            triphasic           +--------+--------+-----+---------+--------+  Radial                  triphasic           +--------+--------+-----+---------+--------+  Ulnar                   triphasic           +--------+--------+-----+---------+--------+  Summary: Right Carotid: The extracranial vessels were near-normal with only minimal wall  thickening or plaque. Left Carotid: The extracranial vessels were near-normal with only minimal wall               thickening or plaque. Vertebrals:  Bilateral vertebral arteries demonstrate antegrade flow. Subclavians: Normal flow hemodynamics were seen in bilateral subclavian              arteries. Right ABI: Resting right ankle-brachial index is within normal range. No evidence of significant right lower extremity arterial disease. The right toe-brachial index is normal. Left ABI: Resting left ankle-brachial index is within normal range. No evidence of significant left lower extremity arterial disease. The left toe-brachial index is normal. Bilateral Extremity: Doppler waveforms remain within normal limits with compression bilaterally for the radial arteries. Doppler waveforms remain within normal limits with compression bilaterally for the ulnar arteries.  Electronically signed by Orlie Pollen on 08/04/2021 at 5:48:02 PM.    Final      Treatments: surgery:  08/05/2021   Surgeon:  Gaye Pollack, MD   First Assistant: Jadene Pierini,  PA-C:       Preoperative Diagnosis:  Severe multi-vessel coronary artery disease     Postoperative Diagnosis:  Same      Procedure:   Median Sternotomy Extracorporeal circulation 3.   Coronary artery bypass grafting x 3   Left internal mammary artery graft to the LAD SVG to diagonal SVG to PDA   4.   Endoscopic vein harvest from the right leg    Discharge Exam: Blood pressure 107/68, pulse 63, temperature 97.6 F (36.4 C), resp. rate 17, height 5\' 1"  (1.549 m), weight 70.4 kg, SpO2 96 %.  General appearance: alert, cooperative, and no distress Heart: regular rate and rhythm Lungs: mildly dim left base Abdomen: benign Extremities: no edema Wound: incis healing well, some right thigh echymosis   Discharge Medications:  The patient has been discharged on:   1.Beta Blocker:  Yes [ y  ]                              No   [   ]                              If No, reason:  2.Ace Inhibitor/ARB: Yes [   ]                                     No  [ n   ]                                     If No, reason:BP well managed on current rx so can't initiate currently  3.Statin:   Yes Blue.Reese   ]                  No  [   ]                  If No, reason:  4.Ecasa:  Yes  [  y ]                  No   [   ]  If No, reason:  Patient had ACS upon admission:NSTEMI  Plavix/P2Y12 inhibitor: Yes [ y  ]                                      No  [   ]     Discharge Instructions     AMB Referral to Cardiac Rehabilitation - Phase II   Complete by: As directed    Diagnosis: CABG   CABG X ___: 3   After initial evaluation and assessments completed: Virtual Based Care may be provided alone or in conjunction with Phase 2 Cardiac Rehab based on patient barriers.: Yes   Discharge patient   Complete by: As directed    Discharge disposition: 01-Home or Self Care   Discharge patient date: 08/11/2021      Allergies as of 08/11/2021       Reactions   Amoxicillin-pot Clavulanate Nausea And Vomiting   Ciprofloxacin Hives   Sulfa Antibiotics Other (See Comments)   Trembling, paralyzing pain, red eyes,  skin crawling   Latex Itching   Macrolides And Ketolides Diarrhea, Nausea And Vomiting   Tetracyclines & Related Other (See Comments), Tinitus   Vertigo        Medication List     STOP taking these medications    cyclobenzaprine 10 MG tablet Commonly known as: FLEXERIL   oxyCODONE-acetaminophen 5-325 MG tablet Commonly known as: Percocet       TAKE these medications    acetaminophen 500 MG tablet Commonly known as: TYLENOL Take 500 mg by mouth daily as needed for headache (pain).   amiodarone 200 MG tablet Commonly known as: PACERONE Take 1 tablet (200 mg total) by mouth 2 (two) times daily.   aspirin EC 81 MG tablet Take 1 tablet (81 mg total) by mouth daily.   atenolol 50 MG tablet Commonly known as: TENORMIN Take 1 tablet (50 mg total) by mouth every morning. What changed:  medication strength how much to take   betamethasone dipropionate 0.05 % lotion Apply 1 application topically 2 (two) times daily as needed (scalp rash).   clopidogrel 75 MG tablet Commonly known as: PLAVIX Take 1 tablet (75 mg total) by mouth daily.   docusate sodium 100 MG capsule Commonly known as: Colace Take 1 capsule (100 mg total) by mouth daily as needed for up to 30 doses. What changed: reasons to take this   ECHINACEA PO Take 2 capsules by mouth daily as needed (immune system boost).   ferrous [id number] C-folic acid capsule Commonly known as: TRINSICON / FOLTRIN Take 1 capsule by mouth 2 (two) times daily after a meal.   hydrocortisone 2.5 % rectal cream Commonly known as: ANUSOL-HC Place 1 application rectally 3 (three) times daily as needed for hemorrhoids.   indapamide 2.5 MG tablet Commonly known as: LOZOL Take 2.5 mg by mouth every morning.   MAGNESIUM PO Take 1 tablet by mouth at bedtime as needed (relax nervew).   oxyCODONE 5 MG immediate release tablet Commonly known as: Oxy IR/ROXICODONE Take 1 tablet (5 mg total) by mouth every 6 (six)  hours as needed for severe pain.   Potassium Citrate 15 MEQ (1620 MG) Tbcr Take 30 mEq by mouth every morning.   rosuvastatin 20 MG tablet Commonly known as: CRESTOR Take 1 tablet (20 mg total) by mouth daily.   THERATEARS OP Place 1 drop into both eyes 2 (two) times daily as  needed (dry eyes).   vitamin C 1000 MG tablet Take 2,000 mg by mouth daily as needed (immune support).   ZINC PO Take 1 tablet by mouth at bedtime as needed (to relax nerves).         Signed: @mec @ 08/11/2021, 8:21 AM

## 2021-08-05 NOTE — Progress Notes (Addendum)
Patient belongings left on nursing station for son to pick up today. Includes pt guitar, cell phone, purse, cream for hives, bible and clothing. Will pass on day shift. Patient labels in place.  2202 spoke to [place]- pt's  Grandson, he'll pick up pt belongings at 3 EAST.

## 2021-08-06 ENCOUNTER — Inpatient Hospital Stay (HOSPITAL_COMMUNITY): Payer: Medicare Other

## 2021-08-06 DIAGNOSIS — Z951 Presence of aortocoronary bypass graft: Secondary | ICD-10-CM

## 2021-08-06 LAB — BASIC METABOLIC PANEL
Anion gap: 9 (ref 5–15)
Anion gap: 10 (ref 5–15)
BUN: 17 mg/dL (ref 8–23)
BUN: 16 mg/dL (ref 8–23)
CO2: 23 mmol/L (ref 22–32)
CO2: 23 mmol/L (ref 22–32)
Calcium: 7.7 mg/dL — ABNORMAL LOW (ref 8.9–10.3)
Calcium: 8.7 mg/dL — ABNORMAL LOW (ref 8.9–10.3)
Chloride: 103 mmol/L (ref 98–111)
Chloride: 105 mmol/L (ref 98–111)
Creatinine, Ser: 0.93 mg/dL (ref 0.44–1.00)
Creatinine, Ser: 0.91 mg/dL (ref 0.44–1.00)
GFR, Estimated: >60 mL/min (ref >60)
GFR, Estimated: >60 mL/min (ref >60)
Glucose, Bld: 136 mg/dL — ABNORMAL HIGH (ref 70–99)
Glucose, Bld: 135 mg/dL — ABNORMAL HIGH (ref 70–99)
Potassium: 4.2 mmol/L (ref 3.5–5.1)
Potassium: 4.3 mmol/L (ref 3.5–5.1)
Sodium: 135 mmol/L (ref 135–145)
Sodium: 138 mmol/L (ref 135–145)

## 2021-08-06 LAB — CBC
HCT: 25.1 % — ABNORMAL LOW (ref 36.0–46.0)
HCT: 27 % — ABNORMAL LOW (ref 36.0–46.0)
Hemoglobin: 8.2 g/dL — ABNORMAL LOW (ref 12.0–15.0)
Hemoglobin: 9.1 g/dL — ABNORMAL LOW (ref 12.0–15.0)
MCH: 31.2 pg (ref 26.0–34.0)
MCH: 31.8 pg (ref 26.0–34.0)
MCHC: 32.7 g/dL (ref 30.0–36.0)
MCHC: 33.7 g/dL (ref 30.0–36.0)
MCV: 95.4 fL (ref 80.0–100.0)
MCV: 94.4 fL (ref 80.0–100.0)
Platelets: 134 10*3/uL — ABNORMAL LOW (ref 150–400)
Platelets: 154 10*3/uL (ref 150–400)
RBC: 2.63 MIL/uL — ABNORMAL LOW (ref 3.87–5.11)
RBC: 2.86 MIL/uL — ABNORMAL LOW (ref 3.87–5.11)
RDW: 12.5 % (ref 11.5–15.5)
RDW: 12.9 % (ref 11.5–15.5)
WBC: 12 10*3/uL — ABNORMAL HIGH (ref 4.0–10.5)
WBC: 13.6 10*3/uL — ABNORMAL HIGH (ref 4.0–10.5)
nRBC: 0 % (ref 0.0–0.2)
nRBC: 0 % (ref 0.0–0.2)

## 2021-08-06 LAB — GLUCOSE, CAPILLARY
Glucose-Capillary: 118 mg/dL — ABNORMAL HIGH (ref 70–99)
Glucose-Capillary: 120 mg/dL — ABNORMAL HIGH (ref 70–99)
Glucose-Capillary: 127 mg/dL — ABNORMAL HIGH (ref 70–99)
Glucose-Capillary: 132 mg/dL — ABNORMAL HIGH (ref 70–99)
Glucose-Capillary: 139 mg/dL — ABNORMAL HIGH (ref 70–99)
Glucose-Capillary: 139 mg/dL — ABNORMAL HIGH (ref 70–99)
Glucose-Capillary: 144 mg/dL — ABNORMAL HIGH (ref 70–99)
Glucose-Capillary: 147 mg/dL — ABNORMAL HIGH (ref 70–99)
Glucose-Capillary: 150 mg/dL — ABNORMAL HIGH (ref 70–99)
Glucose-Capillary: 153 mg/dL — ABNORMAL HIGH (ref 70–99)
Glucose-Capillary: 159 mg/dL — ABNORMAL HIGH (ref 70–99)
Glucose-Capillary: 167 mg/dL — ABNORMAL HIGH (ref 70–99)
Glucose-Capillary: 97 mg/dL (ref 70–99)

## 2021-08-06 LAB — MAGNESIUM
Magnesium: 2.5 mg/dL — ABNORMAL HIGH (ref 1.7–2.4)
Magnesium: 2.3 mg/dL (ref 1.7–2.4)

## 2021-08-06 MED ORDER — SODIUM CHLORIDE 0.9 % IV SOLN
12.5000 mg | Freq: Four times a day (QID) | INTRAVENOUS | Status: DC | PRN
  Administered 2021-08-06: 12.5 mg via INTRAVENOUS
  Filled 2021-08-06: qty 0.5
  Filled 2021-08-06: qty 12.5
  Filled 2021-08-06: qty 0.5

## 2021-08-06 MED ORDER — METOPROLOL TARTRATE 25 MG PO TABS
25.0000 mg | ORAL_TABLET | Freq: Two times a day (BID) | ORAL | Status: DC
  Administered 2021-08-06 – 2021-08-10 (×8): 25 mg via ORAL
  Filled 2021-08-06 (×9): qty 1

## 2021-08-06 MED ORDER — SODIUM CHLORIDE 0.9 % IV SOLN
12.5000 mg | Freq: Once | INTRAVENOUS | Status: AC
  Administered 2021-08-06: 12.5 mg via INTRAVENOUS
  Filled 2021-08-06: qty 12.5

## 2021-08-06 MED ORDER — ORAL CARE MOUTH RINSE
15.0000 mL | Freq: Two times a day (BID) | OROMUCOSAL | Status: DC
  Administered 2021-08-06 – 2021-08-11 (×10): 15 mL via OROMUCOSAL

## 2021-08-06 MED ORDER — HYDROCORTISONE 1 % EX CREA
TOPICAL_CREAM | Freq: Two times a day (BID) | CUTANEOUS | Status: DC
  Administered 2021-08-10 – 2021-08-11 (×2): 1 via TOPICAL
  Filled 2021-08-06: qty 28

## 2021-08-06 MED ORDER — ENOXAPARIN SODIUM 30 MG/0.3ML IJ SOSY
30.0000 mg | PREFILLED_SYRINGE | Freq: Every day | INTRAMUSCULAR | Status: DC
  Administered 2021-08-06 – 2021-08-10 (×5): 30 mg via SUBCUTANEOUS
  Filled 2021-08-06 (×5): qty 0.3

## 2021-08-06 MED ORDER — INSULIN ASPART 100 UNIT/ML IJ SOLN
0.0000 [IU] | INTRAMUSCULAR | Status: DC
  Administered 2021-08-06 (×3): 2 [IU] via SUBCUTANEOUS
  Administered 2021-08-07 (×2): 4 [IU] via SUBCUTANEOUS
  Administered 2021-08-08: 2 [IU] via SUBCUTANEOUS

## 2021-08-06 NOTE — Progress Notes (Signed)
[address]       [place] [address]             [phone]                 1 Day Post-Op Procedure(s) (LRB): CORONARY ARTERY BYPASS GRAFTING (CABG) TIMES THREE, USING LEFT INTERNAL MAMMARTY ARTERY AND ENDOSCOPICALLY HARVESTED RIGHT GREATER SAPHENOUS VEIN (N/A) TRANSESOPHAGEAL ECHOCARDIOGRAM (TEE) (N/A) ENDOVEIN HARVEST OF GREATER SAPHENOUS VEIN (Right)   Events: No events Extubated overnight _______________________________________________________________ Vitals: BP 123/66    Pulse 89    Temp 98.2 F (36.8 C)    Resp 11    Ht 5\' 1"  (1.549 m)    Wt 78.3 kg    SpO2 91%    BMI 32.62 kg/m  Filed Weights   08/04/21 0219 08/05/21 0118 08/06/21 0500  Weight: 68.2 kg 68.9 kg 78.3 kg     - Neuro: alert NAD  - Cardiovascular: sinus  Drips: none.   PAP: (19-40)/(10-24) 25/14 CO:  [2.9 L/min-5.8 L/min] 5.3 L/min CI:  [1.7 L/min/m2-3.5 L/min/m2] 3.2 L/min/m2  - Pulm: EWOB Vent Mode: PSV;CPAP FiO2 (%):  [40 %-50 %] 40 % Set Rate:  [4 bmp-18 bmp] 4 bmp Vt Set:  [380 mL-450 mL] 450 mL PEEP:  [5 cmH20] 5 cmH20 Pressure Support:  [10 cmH20] 10 cmH20 Plateau Pressure:  [13 cmH20-17 cmH20] 17 cmH20  ABG    Component Value Date/Time   PHART 7.392 08/05/2021 2100   PCO2ART 38.2 08/05/2021 2100   PO2ART 162 (H) 08/05/2021 2100   HCO3 23.3 08/05/2021 2100   TCO2 24 08/05/2021 2100   ACIDBASEDEF 2.0 08/05/2021 2100   O2SAT 99.0 08/05/2021 2100    - Abd: ND - Extremity: cool  .Intake/Output      02/03 0701 02/04 0700 02/04 0701 02/05 0700   P.O. 300    I.V. (mL/kg) 2631.9 (33.6) 61.3 (0.8)   Blood 250    IV Piggyback 1578.8 50   Total Intake(mL/kg) 4760.7 (60.8) 111.3 (1.4)   Urine (mL/kg/hr) 2220 (1.2) 20 (0.1)   Stool     Blood 150    Chest Tube 220 75   Total Output 2590 95   Net +2170.7 +16.3           _______________________________________________________________ Labs: CBC Latest Ref Rng & Units 08/06/2021 08/05/2021 08/05/2021  WBC 4.0  - 10.5 K/uL 12.0(H) 11.7(H) -  Hemoglobin 12.0 - 15.0 g/dL 8.2(L) 8.4(L) 5.4(LL)  Hematocrit 36.0 - 46.0 % 25.1(L) 24.7(L) 16.0(L)  Platelets 150 - 400 K/uL 134(L) 136(L) -   CMP Latest Ref Rng & Units 08/06/2021 08/05/2021 08/05/2021  Glucose 70 - 99 mg/dL 136(H) 147(H) -  BUN 8 - 23 mg/dL 17 17 -  Creatinine 0.44 - 1.00 mg/dL 0.93 0.98 -  Sodium 135 - 145 mmol/L 135 140 141  Potassium 3.5 - 5.1 mmol/L 4.2 4.6 4.2  Chloride 98 - 111 mmol/L 103 109 -  CO2 22 - 32 mmol/L 23 23 -  Calcium 8.9 - 10.3 mg/dL 7.7(L) 7.7(L) -  Total Protein 6.5 - 8.1 g/dL - - -  Total Bilirubin 0.3 - 1.2 mg/dL - - -  Alkaline Phos 38 - 126 U/L - - -  AST 15 - 41 U/L - - -  ALT 0 - 44 U/L - - -    CXR: clear  _______________________________________________________________  Assessment and Plan: POD 1 s/p CABG  Neuro: pain controlled CV: will remove A line.  Increasing BB.  On A/S Pulm: pulm Hyg Renal: creat stable.  Will remove foley GI: on diet Heme: stable ID: [email or web address] Endo: SSI Dispo: continue ICU care   Lajuana Matte 08/06/2021 9:43 AM

## 2021-08-06 NOTE — Plan of Care (Signed)
  Problem: Activity: Goal: Risk for activity intolerance will decrease Outcome: Progressing   Problem: Cardiac: Goal: Will achieve and/or maintain hemodynamic stability Outcome: Progressing   Problem: Clinical Measurements: Goal: Postoperative complications will be avoided or minimized Outcome: Progressing   Problem: Respiratory: Goal: Respiratory status will improve Outcome: Progressing   Problem: Skin Integrity: Goal: Wound healing without signs and symptoms of infection Outcome: Progressing Goal: Risk for impaired skin integrity will decrease Outcome: Progressing   Problem: Urinary Elimination: Goal: Ability to achieve and maintain adequate renal perfusion and functioning will improve Outcome: Progressing   

## 2021-08-06 NOTE — Plan of Care (Signed)
Problem: Health Behavior/Discharge Planning: Goal: Ability to manage health-related needs will improve Outcome: Progressing   Problem: Clinical Measurements: Goal: Ability to maintain clinical measurements within normal limits will improve Outcome: Progressing Goal: Will remain free from infection Outcome: Progressing Goal: Diagnostic test results will improve Outcome: Progressing Goal: Respiratory complications will improve Outcome: Progressing Goal: Cardiovascular complication will be avoided Outcome: Progressing   Problem: Nutrition: Goal: Adequate nutrition will be maintained Outcome: Progressing Note: Pt not up for carb modified tray today but able to eat soup, crackers, and drinks Nausea seems to have resolved   Problem: Coping: Goal: Level of anxiety will decrease Outcome: Progressing   Problem: Elimination: Goal: Will not experience complications related to bowel motility Outcome: Progressing Note: Passing gas, nausea resolved   Problem: Pain Managment: Goal: General experience of comfort will improve Outcome: Progressing   Problem: Skin Integrity: Goal: Risk for impaired skin integrity will decrease Outcome: Progressing   Problem: Education: Goal: Understanding of cardiac disease, CV risk reduction, and recovery process will improve Outcome: Progressing   Problem: Activity: Goal: Ability to tolerate increased activity will improve Outcome: Progressing   Problem: Cardiac: Goal: Ability to achieve and maintain adequate cardiovascular perfusion will improve Outcome: Progressing   Problem: Education: Goal: Understanding of CV disease, CV risk reduction, and recovery process will improve Outcome: Progressing   Problem: Activity: Goal: Ability to return to baseline activity level will improve Outcome: Progressing   Problem: Cardiovascular: Goal: Ability to achieve and maintain adequate cardiovascular perfusion will improve Outcome: Progressing Goal:  Vascular access site(s) Level 0-1 will be maintained Outcome: Completed/Met

## 2021-08-06 NOTE — Progress Notes (Signed)
° °  Tolerated surgery well -extubated. Maintaining sinus rhythm. Op report suggests LAD looked better than at cath.  She is feels reasonably good today. Says surgery was not as bad as she thought it would be. No further suggestions at this time.   Pixie Casino, MD, [facility], [place] Director of the [facility]  [facility] Diplomate of the American Board of Clinical Lipidology Attending Cardiologist  Direct Dial: [phone]   Fax: [phone]  Website:  [email or web address]

## 2021-08-07 ENCOUNTER — Inpatient Hospital Stay (HOSPITAL_COMMUNITY): Payer: Medicare Other

## 2021-08-07 DIAGNOSIS — Z951 Presence of aortocoronary bypass graft: Secondary | ICD-10-CM | POA: Diagnosis not present

## 2021-08-07 DIAGNOSIS — I480 Paroxysmal atrial fibrillation: Secondary | ICD-10-CM

## 2021-08-07 DIAGNOSIS — I214 Non-ST elevation (NSTEMI) myocardial infarction: Secondary | ICD-10-CM | POA: Diagnosis not present

## 2021-08-07 LAB — CBC
HCT: 24.6 % — ABNORMAL LOW (ref 36.0–46.0)
Hemoglobin: 8.1 g/dL — ABNORMAL LOW (ref 12.0–15.0)
MCH: 31.8 pg (ref 26.0–34.0)
MCHC: 32.9 g/dL (ref 30.0–36.0)
MCV: 96.5 fL (ref 80.0–100.0)
Platelets: 148 10*3/uL — ABNORMAL LOW (ref 150–400)
RBC: 2.55 MIL/uL — ABNORMAL LOW (ref 3.87–5.11)
RDW: 13.2 % (ref 11.5–15.5)
WBC: 11.2 10*3/uL — ABNORMAL HIGH (ref 4.0–10.5)
nRBC: 0 % (ref 0.0–0.2)

## 2021-08-07 LAB — BASIC METABOLIC PANEL
Anion gap: 7 (ref 5–15)
BUN: 20 mg/dL (ref 8–23)
CO2: 25 mmol/L (ref 22–32)
Calcium: 8.3 mg/dL — ABNORMAL LOW (ref 8.9–10.3)
Chloride: 103 mmol/L (ref 98–111)
Creatinine, Ser: 0.81 mg/dL (ref 0.44–1.00)
GFR, Estimated: >60 mL/min (ref >60)
Glucose, Bld: 126 mg/dL — ABNORMAL HIGH (ref 70–99)
Potassium: 3.9 mmol/L (ref 3.5–5.1)
Sodium: 135 mmol/L (ref 135–145)

## 2021-08-07 LAB — GLUCOSE, CAPILLARY
Glucose-Capillary: 112 mg/dL — ABNORMAL HIGH (ref 70–99)
Glucose-Capillary: 172 mg/dL — ABNORMAL HIGH (ref 70–99)
Glucose-Capillary: 135 mg/dL — ABNORMAL HIGH (ref 70–99)
Glucose-Capillary: 115 mg/dL — ABNORMAL HIGH (ref 70–99)
Glucose-Capillary: 119 mg/dL — ABNORMAL HIGH (ref 70–99)
Glucose-Capillary: 105 mg/dL — ABNORMAL HIGH (ref 70–99)

## 2021-08-07 MED ORDER — POTASSIUM CHLORIDE CRYS ER 20 MEQ PO TBCR
20.0000 meq | EXTENDED_RELEASE_TABLET | Freq: Once | ORAL | Status: AC
  Administered 2021-08-07: 20 meq via ORAL
  Filled 2021-08-07: qty 1

## 2021-08-07 MED ORDER — FUROSEMIDE 10 MG/ML IJ SOLN
40.0000 mg | Freq: Every day | INTRAMUSCULAR | Status: AC
  Administered 2021-08-07 – 2021-08-09 (×3): 40 mg via INTRAVENOUS
  Filled 2021-08-07 (×3): qty 4

## 2021-08-07 MED ORDER — AMIODARONE LOAD VIA INFUSION
150.0000 mg | Freq: Once | INTRAVENOUS | Status: AC
  Administered 2021-08-07: 150 mg via INTRAVENOUS
  Filled 2021-08-07: qty 83.34

## 2021-08-07 MED ORDER — AMIODARONE HCL IN DEXTROSE 360-4.14 MG/200ML-% IV SOLN
30.0000 mg/h | INTRAVENOUS | Status: AC
  Administered 2021-08-07 – 2021-08-08 (×3): 30 mg/h via INTRAVENOUS
  Filled 2021-08-07 (×2): qty 200

## 2021-08-07 MED ORDER — AMIODARONE HCL IN DEXTROSE 360-4.14 MG/200ML-% IV SOLN
60.0000 mg/h | INTRAVENOUS | Status: AC
  Administered 2021-08-07 (×2): 60 mg/h via INTRAVENOUS
  Filled 2021-08-07: qty 400

## 2021-08-07 NOTE — Progress Notes (Addendum)
Epicardial wires pulled per order.

## 2021-08-07 NOTE — Progress Notes (Signed)
° °   °  [address]       [place] [address]             [phone]                 2 Days Post-Op Procedure(s) (LRB): CORONARY ARTERY BYPASS GRAFTING (CABG) TIMES THREE, USING LEFT INTERNAL MAMMARTY ARTERY AND ENDOSCOPICALLY HARVESTED RIGHT GREATER SAPHENOUS VEIN (N/A) TRANSESOPHAGEAL ECHOCARDIOGRAM (TEE) (N/A) ENDOVEIN HARVEST OF GREATER SAPHENOUS VEIN (Right)   Events: Afib overnight _______________________________________________________________ Vitals: BP (!) 84/53    Pulse 78    Temp 97.9 F (36.6 C) (Oral)    Resp 17    Ht 5\' 1"  (1.549 m)    Wt 73.4 kg    SpO2 92%    BMI 30.58 kg/m  Filed Weights   08/05/21 0118 08/06/21 0500 08/07/21 0600  Weight: 68.9 kg 78.3 kg 73.4 kg     - Neuro: alert NAD  - Cardiovascular: afib  Drips: amio 60    - Pulm: EWOB    ABG    Component Value Date/Time   PHART 7.392 08/05/2021 2100   PCO2ART 38.2 08/05/2021 2100   PO2ART 162 (H) 08/05/2021 2100   HCO3 23.3 08/05/2021 2100   TCO2 24 08/05/2021 2100   ACIDBASEDEF 2.0 08/05/2021 2100   O2SAT 99.0 08/05/2021 2100    - Abd: ND - Extremity: warm  .Intake/Output      02/04 0701 02/05 0700 02/05 0701 02/06 0700   P.O. 660 100   I.V. (mL/kg) 244.1 (3.3) 103.5 (1.4)   Blood     IV Piggyback 300 100   Total Intake(mL/kg) 1204.1 (16.4) 303.5 (4.1)   Urine (mL/kg/hr) 1245 (0.7)    Blood     Chest Tube 305    Total Output 1550    Net -345.9 +303.5           _______________________________________________________________ Labs: CBC Latest Ref Rng & Units 08/07/2021 08/06/2021 08/06/2021  WBC 4.0 - 10.5 K/uL 11.2(H) 13.6(H) 12.0(H)  Hemoglobin 12.0 - 15.0 g/dL 8.1(L) 9.1(L) 8.2(L)  Hematocrit 36.0 - 46.0 % 24.6(L) 27.0(L) 25.1(L)  Platelets 150 - 400 K/uL 148(L) 154 134(L)   CMP Latest Ref Rng & Units 08/07/2021 08/06/2021 08/06/2021  Glucose 70 - 99 mg/dL 126(H) 135(H) 136(H)  BUN 8 - 23 mg/dL 20 16 17   Creatinine 0.44 - 1.00 mg/dL 0.81 0.91 0.93  Sodium 135  - 145 mmol/L 135 138 135  Potassium 3.5 - 5.1 mmol/L 3.9 4.3 4.2  Chloride 98 - 111 mmol/L 103 105 103  CO2 22 - 32 mmol/L 25 23 23   Calcium 8.9 - 10.3 mg/dL 8.3(L) 8.7(L) 7.7(L)  Total Protein 6.5 - 8.1 g/dL - - -  Total Bilirubin 0.3 - 1.2 mg/dL - - -  Alkaline Phos 38 - 126 U/L - - -  AST 15 - 41 U/L - - -  ALT 0 - 44 U/L - - -    CXR: PV congestion  _______________________________________________________________  Assessment and Plan: POD 2 s/p CABG  Neuro: pain controlled CV: will remove wires and tubes.  On BB.  On A/S.  Will keep amio protocol Pulm: pulm Hyg Renal: creat stable.  Will diurese  GI: on diet Heme: stable ID: afebrile Endo: SSI Dispo: continue ICU care.  Floor likely tomorrow   Lajuana Matte 08/07/2021 10:14 AM

## 2021-08-07 NOTE — Progress Notes (Signed)
Patient rhythm afib, Dr.Lightfoot paged. Order given for amiodarone protocol.

## 2021-08-07 NOTE — Progress Notes (Signed)
EVENING ROUNDS NOTE :     [address]       [place] [address]             [phone]                 2 Days Post-Op Procedure(s) (LRB): CORONARY ARTERY BYPASS GRAFTING (CABG) TIMES THREE, USING LEFT INTERNAL MAMMARTY ARTERY AND ENDOSCOPICALLY HARVESTED RIGHT GREATER SAPHENOUS VEIN (N/A) TRANSESOPHAGEAL ECHOCARDIOGRAM (TEE) (N/A) ENDOVEIN HARVEST OF GREATER SAPHENOUS VEIN (Right)   Total Length of Stay:  LOS: 6 days  Events:   No events Rate controlled Continue amio    BP (!) 101/55    Pulse 96    Temp 98 F (36.7 C) (Oral)    Resp 20    Ht 5\' 1"  (1.549 m)    Wt 73.4 kg    SpO2 (!) 89%    BMI 30.58 kg/m          sodium chloride 10 mL/hr at 08/07/21 1800   sodium chloride 250 mL (08/06/21 0503)   sodium chloride     amiodarone 30 mg/hr (08/07/21 1800)   lactated ringers     lactated ringers Stopped (08/06/21 0955)   promethazine (PHENERGAN) injection (IM or IVPB) Stopped (08/06/21 1518)    I/O last 3 completed shifts: In: 1801.2 [P.O.:760; I.V.:641.2; IV Piggyback:400] Out: 2800 [Urine:2495; Chest Tube:305]   CBC Latest Ref Rng & Units 08/07/2021 08/06/2021 08/06/2021  WBC 4.0 - 10.5 K/uL 11.2(H) 13.6(H) 12.0(H)  Hemoglobin 12.0 - 15.0 g/dL 8.1(L) 9.1(L) 8.2(L)  Hematocrit 36.0 - 46.0 % 24.6(L) 27.0(L) 25.1(L)  Platelets 150 - 400 K/uL 148(L) 154 134(L)    BMP Latest Ref Rng & Units 08/07/2021 08/06/2021 08/06/2021  Glucose 70 - 99 mg/dL 126(H) 135(H) 136(H)  BUN 8 - 23 mg/dL 20 16 17   Creatinine 0.44 - 1.00 mg/dL 0.81 0.91 0.93  Sodium 135 - 145 mmol/L 135 138 135  Potassium 3.5 - 5.1 mmol/L 3.9 4.3 4.2  Chloride 98 - 111 mmol/L 103 105 103  CO2 22 - 32 mmol/L 25 23 23   Calcium 8.9 - 10.3 mg/dL 8.3(L) 8.7(L) 7.7(L)    ABG    Component Value Date/Time   PHART 7.392 08/05/2021 2100   PCO2ART 38.2 08/05/2021 2100   PO2ART 162 (H) 08/05/2021 2100   HCO3 23.3 08/05/2021 2100   TCO2 24 08/05/2021 2100   ACIDBASEDEF 2.0 08/05/2021 2100   O2SAT  99.0 08/05/2021 2100       Melodie Bouillon, MD 08/07/2021 8:12 PM

## 2021-08-07 NOTE — Progress Notes (Signed)
DAILY PROGRESS NOTE   Patient Name: Tiffany Odonnell Date of Encounter: 08/07/2021 Cardiologist: Pixie Casino, MD  Chief Complaint   No chest pain  Patient Profile   Tiffany Odonnell is a 71 y.o. female originally from the [place], with a hx of DM, HLD, HTN, neuropathy, Sjogren's dz, anxiety, who is being seen 08/01/2021 for the evaluation of chest pain at the request of Dr Mariane Masters.  Subjective   Noted to go into afib overnight- started on amiodarone this morning. Net negative 345 cc overnight- 1.5L negative.  Labs stable. K and Mg ok.  She feels fatigued and swollen.  Objective   Vitals:   08/07/21 0400 08/07/21 0500 08/07/21 0600 08/07/21 0700  BP: 110/63 103/73 102/69 114/87  Pulse: 75 77 83 90  Resp: 12 14 14 20   Temp:      TempSrc:      SpO2: 92% 91% 91% 91%  Weight:   73.4 kg   Height:        Intake/Output Summary (Last 24 hours) at 08/07/2021 0810 Last data filed at 08/07/2021 [id number] Gross per 24 hour  Intake 1124.53 ml  Output 1455 ml  Net -330.47 ml   Filed Weights   08/05/21 0118 08/06/21 0500 08/07/21 0600  Weight: 68.9 kg 78.3 kg 73.4 kg    Physical Exam   General appearance: alert and no distress Lungs: clear to auscultation bilaterally Heart: irregularly irregular rhythm Extremities: extremities normal, atraumatic, no cyanosis or edema Neurologic: Grossly normal  Inpatient Medications    Scheduled Meds:  acetaminophen  1,000 mg Oral Q6H   Or   acetaminophen (TYLENOL) oral liquid 160 mg/5 mL  1,000 mg Per Tube Q6H   aspirin EC  325 mg Oral Daily   Or   aspirin  324 mg Per Tube Daily   bisacodyl  10 mg Oral Daily   Or   bisacodyl  10 mg Rectal Daily   Chlorhexidine Gluconate Cloth  6 each Topical Daily   docusate sodium  200 mg Oral Daily   enoxaparin (LOVENOX) injection  30 mg Subcutaneous QHS   hydrocortisone cream   Topical BID   insulin aspart  0-24 Units Subcutaneous Q4H   mouth rinse  15 mL Mouth Rinse BID   metoprolol  tartrate  25 mg Oral BID   pantoprazole  40 mg Oral Daily   sodium chloride flush  10-40 mL Intracatheter Q12H   sodium chloride flush  3 mL Intravenous Q12H    Continuous Infusions:  sodium chloride 10 mL/hr at 08/07/21 0600   sodium chloride 250 mL (08/06/21 0503)   sodium chloride     amiodarone 60 mg/hr (08/07/21 0754)   Followed by   amiodarone     insulin Stopped (08/06/21 1321)   lactated ringers     lactated ringers Stopped (08/06/21 0955)   promethazine (PHENERGAN) injection (IM or IVPB) Stopped (08/06/21 1518)    PRN Meds: sodium chloride, dextrose, metoprolol tartrate, midazolam, morphine injection, ondansetron (ZOFRAN) IV, oxyCODONE, promethazine (PHENERGAN) injection (IM or IVPB), sodium chloride flush, sodium chloride flush, traMADol   Labs   Results for orders placed or performed during the hospital encounter of 08/01/21 (from the past 48 hour(s))  I-STAT 7, (LYTES, BLD GAS, ICA, H+H)     Status: Abnormal   Collection Time: 08/05/21  8:12 AM  Result Value Ref Range   pH, Arterial 7.368 7.350 - 7.450   pCO2 arterial 40.8 32.0 - 48.0 mmHg   pO2, Arterial 230 (H)  83.0 - 108.0 mmHg   Bicarbonate 23.5 20.0 - 28.0 mmol/L   TCO2 25 22 - 32 mmol/L   O2 Saturation 100.0 %   Acid-base deficit 2.0 0.0 - 2.0 mmol/L   Sodium 136 135 - 145 mmol/L   Potassium 3.7 3.5 - 5.1 mmol/L   Calcium, Ion 1.25 1.15 - 1.40 mmol/L   HCT 39.0 36.0 - 46.0 %   Hemoglobin 13.3 12.0 - 15.0 g/dL   Sample type ARTERIAL   POCT Activated clotting time     Status: None   Collection Time: 08/05/21  9:33 AM  Result Value Ref Range   Activated Clotting Time 0 seconds    Comment: Reference range 74-137 seconds for patients not on anticoagulant therapy.  I-STAT 7, (LYTES, BLD GAS, ICA, H+H)     Status: Abnormal   Collection Time: 08/05/21  9:40 AM  Result Value Ref Range   pH, Arterial 7.443 7.350 - 7.450   pCO2 arterial 36.9 32.0 - 48.0 mmHg   pO2, Arterial 325 (H) 83.0 - 108.0 mmHg    Bicarbonate 25.3 20.0 - 28.0 mmol/L   TCO2 26 22 - 32 mmol/L   O2 Saturation 100.0 %   Acid-Base Excess 1.0 0.0 - 2.0 mmol/L   Sodium 139 135 - 145 mmol/L   Potassium 5.1 3.5 - 5.1 mmol/L   Calcium, Ion 1.02 (L) 1.15 - 1.40 mmol/L   HCT 25.0 (L) 36.0 - 46.0 %   Hemoglobin 8.5 (L) 12.0 - 15.0 g/dL   Sample type ARTERIAL   I-STAT, chem 8     Status: Abnormal   Collection Time: 08/05/21  9:45 AM  Result Value Ref Range   Sodium 138 135 - 145 mmol/L   Potassium 4.3 3.5 - 5.1 mmol/L   Chloride 101 98 - 111 mmol/L   BUN 18 8 - 23 mg/dL   Creatinine, Ser 0.70 0.44 - 1.00 mg/dL   Glucose, Bld 85 70 - 99 mg/dL    Comment: Glucose reference range applies only to samples taken after fasting for at least 8 hours.   Calcium, Ion 0.94 (L) 1.15 - 1.40 mmol/L   TCO2 26 22 - 32 mmol/L   Hemoglobin 7.8 (L) 12.0 - 15.0 g/dL   HCT 23.0 (L) 36.0 - 46.0 %  Prepare RBC (crossmatch)     Status: None   Collection Time: 08/05/21  9:51 AM  Result Value Ref Range   Order Confirmation      ORDER PROCESSED BY BLOOD BANK Performed at [facility], [place] [address] [place], [place] [address]   I-STAT 7, (LYTES, BLD GAS, ICA, H+H)     Status: Abnormal   Collection Time: 08/05/21  9:54 AM  Result Value Ref Range   pH, Arterial 7.501 (H) 7.350 - 7.450   pCO2 arterial 30.8 (L) 32.0 - 48.0 mmHg   pO2, Arterial 366 (H) 83.0 - 108.0 mmHg   Bicarbonate 24.1 20.0 - 28.0 mmol/L   TCO2 25 22 - 32 mmol/L   O2 Saturation 100.0 %   Acid-Base Excess 1.0 0.0 - 2.0 mmol/L   Sodium 138 135 - 145 mmol/L   Potassium 4.4 3.5 - 5.1 mmol/L   Calcium, Ion 0.97 (L) 1.15 - 1.40 mmol/L   HCT 23.0 (L) 36.0 - 46.0 %   Hemoglobin 7.8 (L) 12.0 - 15.0 g/dL   Sample type ARTERIAL   Hemoglobin and hematocrit, blood     Status: Abnormal   Collection Time: 08/05/21 10:13 AM  Result Value  Ref Range   Hemoglobin 7.9 (L) 12.0 - 15.0 g/dL    Comment: REPEATED TO VERIFY   HCT 22.9 (L) 36.0 - 46.0 %    Comment: Performed at [facility] [address] [place], [place] [address]  Platelet count     Status: None   Collection Time: 08/05/21 10:13 AM  Result Value Ref Range   Platelets 167 150 - 400 K/uL    Comment: Performed at [facility], [place] [address], [place], [place] [address]  I-STAT, [place] Clap 8     Status: Abnormal   Collection Time: 08/05/21 10:21 AM  Result Value Ref Range   Sodium 136 135 - 145 mmol/L   Potassium 5.5 (H) 3.5 - 5.1 mmol/L   Chloride 104 98 - 111 mmol/L   BUN 19 8 - 23 mg/dL   Creatinine, Ser 0.70 0.44 - 1.00 mg/dL   Glucose, Bld 128 (H) 70 - 99 mg/dL    Comment: Glucose reference range applies only to samples taken after fasting for at least 8 hours.   Calcium, Ion 1.04 (L) 1.15 - 1.40 mmol/L   TCO2 25 22 - 32 mmol/L   Hemoglobin 8.2 (L) 12.0 - 15.0 g/dL   HCT 24.0 (L) 36.0 - 46.0 %  I-STAT 7, (LYTES, BLD GAS, ICA, H+H)     Status: Abnormal   Collection Time: 08/05/21 10:56 AM  Result Value Ref Range   pH, Arterial 7.406 7.350 - 7.450   pCO2 arterial 40.8 32.0 - 48.0 mmHg   pO2, Arterial 356 (H) 83.0 - 108.0 mmHg   Bicarbonate 25.6 20.0 - 28.0 mmol/L   TCO2 27 22 - 32 mmol/L   O2 Saturation 100.0 %   Acid-Base Excess 1.0 0.0 - 2.0 mmol/L   Sodium 137 135 - 145 mmol/L   Potassium 4.5 3.5 - 5.1 mmol/L   Calcium, Ion 1.07 (L) 1.15 - 1.40 mmol/L   HCT 25.0 (L) 36.0 - 46.0 %   Hemoglobin 8.5 (L) 12.0 - 15.0 g/dL   Sample type ARTERIAL   I-STAT, chem 8     Status: Abnormal   Collection Time: 08/05/21 11:00 AM  Result Value Ref Range   Sodium 136 135 - 145 mmol/L   Potassium 4.4 3.5 - 5.1 mmol/L   Chloride 101 98 - 111 mmol/L   BUN 19 8 - 23 mg/dL   Creatinine, Ser 0.80 0.44 - 1.00 mg/dL   Glucose, Bld 136 (H) 70 - 99 mg/dL    Comment: Glucose reference range applies only to samples taken after fasting for at least 8 hours.   Calcium, Ion 1.05 (L) 1.15 - 1.40 mmol/L   TCO2 24 22 - 32 mmol/L   Hemoglobin 8.5 (L) 12.0 - 15.0 g/dL   HCT 25.0 (L) 36.0 - 46.0 %  CBC      Status: Abnormal   Collection Time: 08/05/21 12:23 PM  Result Value Ref Range   WBC 13.9 (H) 4.0 - 10.5 K/uL   RBC 2.93 (L) 3.87 - 5.11 MIL/uL   Hemoglobin 9.3 (L) 12.0 - 15.0 g/dL   HCT 28.3 (L) 36.0 - 46.0 %   MCV 96.6 80.0 - 100.0 fL   MCH 31.7 26.0 - 34.0 pg   MCHC 32.9 30.0 - 36.0 g/dL   RDW 12.5 11.5 - 15.5 %   Platelets 152 150 - 400 K/uL   nRBC 0.0 0.0 - 0.2 %    Comment: Performed at [facility], [place]  [address], [place], [place] [address]  Protime-INR     Status: Abnormal   Collection Time: 08/05/21 12:23 PM  Result Value Ref Range   Prothrombin Time 16.1 (H) 11.4 - 15.2 seconds   INR 1.3 (H) 0.8 - 1.2    Comment: (NOTE) INR goal varies based on device and disease states. Performed at [facility], [place] [address], [place], [place] [address]   APTT     Status: None   Collection Time: 08/05/21 12:23 PM  Result Value Ref Range   aPTT 31 24 - 36 seconds    Comment: Performed at [facility] [address] [place], [place] [address]  Glucose, capillary     Status: Abnormal   Collection Time: 08/05/21 12:25 PM  Result Value Ref Range   Glucose-Capillary 105 (H) 70 - 99 mg/dL    Comment: Glucose reference range applies only to samples taken after fasting for at least 8 hours.  I-STAT 7, (LYTES, BLD GAS, ICA, H+H)     Status: Abnormal   Collection Time: 08/05/21 12:25 PM  Result Value Ref Range   pH, Arterial 7.252 (L) 7.350 - 7.450   pCO2 arterial 47.7 32.0 - 48.0 mmHg   pO2, Arterial 73 (L) 83.0 - 108.0 mmHg   Bicarbonate 21.5 20.0 - 28.0 mmol/L   TCO2 23 22 - 32 mmol/L   O2 Saturation 93.0 %   Acid-base deficit 6.0 (H) 0.0 - 2.0 mmol/L   Sodium 140 135 - 145 mmol/L   Potassium 3.9 3.5 - 5.1 mmol/L   Calcium, Ion 1.14 (L) 1.15 - 1.40 mmol/L   HCT 30.0 (L) 36.0 - 46.0 %   Hemoglobin 10.2 (L) 12.0 - 15.0 g/dL   Patient temperature 35.2 C    Sample type ARTERIAL   Glucose, capillary     Status: Abnormal   Collection Time: 08/05/21  1:28 PM   Result Value Ref Range   Glucose-Capillary 155 (H) 70 - 99 mg/dL    Comment: Glucose reference range applies only to samples taken after fasting for at least 8 hours.  I-STAT 7, (LYTES, BLD GAS, ICA, H+H)     Status: Abnormal   Collection Time: 08/05/21  2:09 PM  Result Value Ref Range   pH, Arterial 7.416 7.350 - 7.450   pCO2 arterial 36.2 32.0 - 48.0 mmHg   pO2, Arterial 126 (H) 83.0 - 108.0 mmHg   Bicarbonate 23.5 20.0 - 28.0 mmol/L   TCO2 25 22 - 32 mmol/L   O2 Saturation 99.0 %   Acid-base deficit 1.0 0.0 - 2.0 mmol/L   Sodium 143 135 - 145 mmol/L   Potassium 3.8 3.5 - 5.1 mmol/L   Calcium, Ion 1.07 (L) 1.15 - 1.40 mmol/L   HCT 28.0 (L) 36.0 - 46.0 %   Hemoglobin 9.5 (L) 12.0 - 15.0 g/dL   Patient temperature 35.8 C    Sample type ARTERIAL   Glucose, capillary     Status: Abnormal   Collection Time: 08/05/21  2:39 PM  Result Value Ref Range   Glucose-Capillary 148 (H) 70 - 99 mg/dL    Comment: Glucose reference range applies only to samples taken after fasting for at least 8 hours.  Glucose, capillary     Status: Abnormal   Collection Time: 08/05/21  4:14 PM  Result Value Ref Range   Glucose-Capillary 132 (H) 70 - 99 mg/dL    Comment: Glucose reference range applies only to samples taken after fasting for at least 8 hours.  Glucose, capillary     Status: Abnormal   Collection Time: 08/05/21  5:06 PM  Result Value Ref Range   Glucose-Capillary 129 (H) 70 - 99 mg/dL    Comment: Glucose reference range applies only to samples taken after fasting for at least 8 hours.  Basic metabolic panel     Status: Abnormal   Collection Time: 08/05/21  6:01 PM  Result Value Ref Range   Sodium 138 135 - 145 mmol/L   Potassium 4.6 3.5 - 5.1 mmol/L   Chloride 109 98 - 111 mmol/L   CO2 22 22 - 32 mmol/L   Glucose, Bld 125 (H) 70 - 99 mg/dL    Comment: Glucose reference range applies only to samples taken after fasting for at least 8 hours.   BUN 17 8 - 23 mg/dL   Creatinine, Ser 0.97  0.44 - 1.00 mg/dL   Calcium 7.6 (L) 8.9 - 10.3 mg/dL   GFR, Estimated >60 >60 mL/min    Comment: (NOTE) Calculated using the CKD-EPI Creatinine Equation (2021)    Anion gap 7 5 - 15    Comment: Performed at [facility] [address] [place], [place] [address]  CBC     Status: Abnormal   Collection Time: 08/05/21  6:01 PM  Result Value Ref Range   WBC 13.4 (H) 4.0 - 10.5 K/uL   RBC 2.89 (L) 3.87 - 5.11 MIL/uL   Hemoglobin 9.2 (L) 12.0 - 15.0 g/dL   HCT 27.1 (L) 36.0 - 46.0 %   MCV 93.8 80.0 - 100.0 fL   MCH 31.8 26.0 - 34.0 pg   MCHC 33.9 30.0 - 36.0 g/dL   RDW 12.3 11.5 - 15.5 %   Platelets 155 150 - 400 K/uL   nRBC 0.0 0.0 - 0.2 %    Comment: Performed at [facility], [place] [address], [place], [place] [address]  Magnesium     Status: Abnormal   Collection Time: 08/05/21  6:01 PM  Result Value Ref Range   Magnesium 3.2 (H) 1.7 - 2.4 mg/dL    Comment: Performed at [facility] [address], [place], [place] [address]  I-STAT 7, (LYTES, BLD GAS, ICA, H+H)     Status: Abnormal   Collection Time: 08/05/21  6:01 PM  Result Value Ref Range   pH, Arterial 7.378 7.350 - 7.450   pCO2 arterial 40.2 32.0 - 48.0 mmHg   pO2, Arterial 159 (H) 83.0 - 108.0 mmHg   Bicarbonate 23.7 20.0 - 28.0 mmol/L   TCO2 25 22 - 32 mmol/L   O2 Saturation 99.0 %   Acid-base deficit 1.0 0.0 - 2.0 mmol/L   Sodium 141 135 - 145 mmol/L   Potassium 4.7 3.5 - 5.1 mmol/L   Calcium, Ion 1.08 (L) 1.15 - 1.40 mmol/L   HCT 28.0 (L) 36.0 - 46.0 %   Hemoglobin 9.5 (L) 12.0 - 15.0 g/dL   Patient temperature 36.9 C    Sample type ARTERIAL   Glucose, capillary     Status: Abnormal   Collection Time: 08/05/21  6:01 PM  Result Value Ref Range   Glucose-Capillary 116 (H) 70 - 99 mg/dL    Comment: Glucose reference range applies only to samples taken after fasting for at least 8 hours.  I-STAT 7, (LYTES, BLD GAS, ICA, H+H)     Status: Abnormal   Collection Time: 08/05/21  7:16 PM  Result  Value Ref Range   pH, Arterial 7.366 7.350 -  7.450   pCO2 arterial 40.4 32.0 - 48.0 mmHg   pO2, Arterial 133 (H) 83.0 - 108.0 mmHg   Bicarbonate 23.2 20.0 - 28.0 mmol/L   TCO2 24 22 - 32 mmol/L   O2 Saturation 99.0 %   Acid-base deficit 2.0 0.0 - 2.0 mmol/L   Sodium 139 135 - 145 mmol/L   Potassium 4.6 3.5 - 5.1 mmol/L   Calcium, Ion 1.10 (L) 1.15 - 1.40 mmol/L   HCT 26.0 (L) 36.0 - 46.0 %   Hemoglobin 8.8 (L) 12.0 - 15.0 g/dL   Patient temperature 29.5 C    Sample type ARTERIAL   Glucose, capillary     Status: Abnormal   Collection Time: 08/05/21  8:35 PM  Result Value Ref Range   Glucose-Capillary 167 (H) 70 - 99 mg/dL    Comment: Glucose reference range applies only to samples taken after fasting for at least 8 hours.  I-STAT 7, (LYTES, BLD GAS, ICA, H+H)     Status: Abnormal   Collection Time: 08/05/21  9:00 PM  Result Value Ref Range   pH, Arterial 7.392 7.350 - 7.450   pCO2 arterial 38.2 32.0 - 48.0 mmHg   pO2, Arterial 162 (H) 83.0 - 108.0 mmHg   Bicarbonate 23.3 20.0 - 28.0 mmol/L   TCO2 24 22 - 32 mmol/L   O2 Saturation 99.0 %   Acid-base deficit 2.0 0.0 - 2.0 mmol/L   Sodium 141 135 - 145 mmol/L   Potassium 4.2 3.5 - 5.1 mmol/L   Calcium, Ion 1.06 (L) 1.15 - 1.40 mmol/L   HCT 16.0 (L) 36.0 - 46.0 %   Hemoglobin 5.4 (LL) 12.0 - 15.0 g/dL   Patient temperature 62.1 C    Sample type ARTERIAL    Comment MD NOTIFIED, SUGGEST RECOLLECT   CBC with Differential/Platelet     Status: Abnormal   Collection Time: 08/05/21  9:15 PM  Result Value Ref Range   WBC 11.7 (H) 4.0 - 10.5 K/uL   RBC 2.60 (L) 3.87 - 5.11 MIL/uL   Hemoglobin 8.4 (L) 12.0 - 15.0 g/dL   HCT 30.8 (L) 65.7 - 84.6 %   MCV 95.0 80.0 - 100.0 fL   MCH 32.3 26.0 - 34.0 pg   MCHC 34.0 30.0 - 36.0 g/dL   RDW 96.2 95.2 - 84.1 %   Platelets 136 (L) 150 - 400 K/uL    Comment: REPEATED TO VERIFY   nRBC 0.0 0.0 - 0.2 %   Neutrophils Relative % 92 %   Neutro Abs 10.7 (H) 1.7 - 7.7 K/uL   Lymphocytes Relative  4 %   Lymphs Abs 0.5 (L) 0.7 - 4.0 K/uL   Monocytes Relative 4 %   Monocytes Absolute 0.5 0.1 - 1.0 K/uL   Eosinophils Relative 0 %   Eosinophils Absolute 0.0 0.0 - 0.5 K/uL   Basophils Relative 0 %   Basophils Absolute 0.0 0.0 - 0.1 K/uL   Immature Granulocytes 0 %   Abs Immature Granulocytes 0.03 0.00 - 0.07 K/uL    Comment: Performed at [facility], [address] [place], [place] [address]  Basic metabolic panel     Status: Abnormal   Collection Time: 08/05/21  9:15 PM  Result Value Ref Range   Sodium 140 135 - 145 mmol/L   Potassium 4.6 3.5 - 5.1 mmol/L   Chloride 109 98 - 111 mmol/L   CO2 23 22 - 32 mmol/L   Glucose, Bld 147 (H) 70 - 99 mg/dL  Comment: Glucose reference range applies only to samples taken after fasting for at least 8 hours.   BUN 17 8 - 23 mg/dL   Creatinine, Ser 0.98 0.44 - 1.00 mg/dL   Calcium 7.7 (L) 8.9 - 10.3 mg/dL   GFR, Estimated >60 >60 mL/min    Comment: (NOTE) Calculated using the CKD-EPI Creatinine Equation (2021)    Anion gap 8 5 - 15    Comment: Performed at [facility] [address] [place], [place] [address]  Magnesium     Status: Abnormal   Collection Time: 08/05/21  9:15 PM  Result Value Ref Range   Magnesium 2.8 (H) 1.7 - 2.4 mg/dL    Comment: Performed at [facility] [address], [place], [place] [address]  Glucose, capillary     Status: Abnormal   Collection Time: 08/05/21  9:38 PM  Result Value Ref Range   Glucose-Capillary 133 (H) 70 - 99 mg/dL    Comment: Glucose reference range applies only to samples taken after fasting for at least 8 hours.  Glucose, capillary     Status: None   Collection Time: 08/05/21 10:51 PM  Result Value Ref Range   Glucose-Capillary 96 70 - 99 mg/dL    Comment: Glucose reference range applies only to samples taken after fasting for at least 8 hours.  Glucose, capillary     Status: Abnormal   Collection Time: 08/05/21 11:38 PM  Result Value Ref Range    Glucose-Capillary 150 (H) 70 - 99 mg/dL    Comment: Glucose reference range applies only to samples taken after fasting for at least 8 hours.  Glucose, capillary     Status: Abnormal   Collection Time: 08/06/21 12:47 AM  Result Value Ref Range   Glucose-Capillary 153 (H) 70 - 99 mg/dL    Comment: Glucose reference range applies only to samples taken after fasting for at least 8 hours.  Glucose, capillary     Status: Abnormal   Collection Time: 08/06/21  2:25 AM  Result Value Ref Range   Glucose-Capillary 144 (H) 70 - 99 mg/dL    Comment: Glucose reference range applies only to samples taken after fasting for at least 8 hours.  CBC     Status: Abnormal   Collection Time: 08/06/21  2:32 AM  Result Value Ref Range   WBC 12.0 (H) 4.0 - 10.5 K/uL   RBC 2.63 (L) 3.87 - 5.11 MIL/uL   Hemoglobin 8.2 (L) 12.0 - 15.0 g/dL   HCT 25.1 (L) 36.0 - 46.0 %   MCV 95.4 80.0 - 100.0 fL   MCH 31.2 26.0 - 34.0 pg   MCHC 32.7 30.0 - 36.0 g/dL   RDW 12.5 11.5 - 15.5 %   Platelets 134 (L) 150 - 400 K/uL   nRBC 0.0 0.0 - 0.2 %    Comment: Performed at [facility], [place] [address] [place], [place] [id number]  Basic metabolic panel     Status: Abnormal   Collection Time: 08/06/21  2:32 AM  Result Value Ref Range   Sodium 135 135 - 145 mmol/L   Potassium 4.2 3.5 - 5.1 mmol/L   Chloride 103 98 - 111 mmol/L   CO2 23 22 - 32 mmol/L   Glucose, Bld 136 (H) 70 - 99 mg/dL    Comment: Glucose reference range applies only to samples taken after fasting for at least 8 hours.   BUN 17 8 - 23 mg/dL   Creatinine, Ser  0.93 0.44 - 1.00 mg/dL   Calcium 7.7 (L) 8.9 - 10.3 mg/dL   GFR, Estimated >60 >60 mL/min    Comment: (NOTE) Calculated using the CKD-EPI Creatinine Equation (2021)    Anion gap 9 5 - 15    Comment: Performed at [facility] [address], [place],  [address]  Magnesium     Status: Abnormal   Collection Time: 08/06/21  2:32 AM  Result Value Ref Range   Magnesium 2.5 (H) 1.7 -  2.4 mg/dL    Comment: Performed at [facility] [address], [place], [place] [address]  Glucose, capillary     Status: None   Collection Time: 08/06/21  4:06 AM  Result Value Ref Range   Glucose-Capillary 97 70 - 99 mg/dL    Comment: Glucose reference range applies only to samples taken after fasting for at least 8 hours.  Glucose, capillary     Status: Abnormal   Collection Time: 08/06/21  5:06 AM  Result Value Ref Range   Glucose-Capillary 127 (H) 70 - 99 mg/dL    Comment: Glucose reference range applies only to samples taken after fasting for at least 8 hours.  Glucose, capillary     Status: Abnormal   Collection Time: 08/06/21  5:56 AM  Result Value Ref Range   Glucose-Capillary 167 (H) 70 - 99 mg/dL    Comment: Glucose reference range applies only to samples taken after fasting for at least 8 hours.  Glucose, capillary     Status: Abnormal   Collection Time: 08/06/21  6:41 AM  Result Value Ref Range   Glucose-Capillary 159 (H) 70 - 99 mg/dL    Comment: Glucose reference range applies only to samples taken after fasting for at least 8 hours.  Glucose, capillary     Status: Abnormal   Collection Time: 08/06/21  8:32 AM  Result Value Ref Range   Glucose-Capillary 139 (H) 70 - 99 mg/dL    Comment: Glucose reference range applies only to samples taken after fasting for at least 8 hours.  Glucose, capillary     Status: Abnormal   Collection Time: 08/06/21 10:30 AM  Result Value Ref Range   Glucose-Capillary 120 (H) 70 - 99 mg/dL    Comment: Glucose reference range applies only to samples taken after fasting for at least 8 hours.  Glucose, capillary     Status: Abnormal   Collection Time: 08/06/21 12:35 PM  Result Value Ref Range   Glucose-Capillary 147 (H) 70 - 99 mg/dL    Comment: Glucose reference range applies only to samples taken after fasting for at least 8 hours.  Glucose, capillary     Status: Abnormal   Collection Time: 08/06/21  1:19 PM  Result Value Ref  Range   Glucose-Capillary 150 (H) 70 - 99 mg/dL    Comment: Glucose reference range applies only to samples taken after fasting for at least 8 hours.  Glucose, capillary     Status: Abnormal   Collection Time: 08/06/21  4:14 PM  Result Value Ref Range   Glucose-Capillary 132 (H) 70 - 99 mg/dL    Comment: Glucose reference range applies only to samples taken after fasting for at least 8 hours.  Basic metabolic panel     Status: Abnormal   Collection Time: 08/06/21  4:20 PM  Result Value Ref Range   Sodium 138 135 - 145 mmol/L   Potassium 4.3 3.5 - 5.1 mmol/L   Chloride 105 98 -  111 mmol/L   CO2 23 22 - 32 mmol/L   Glucose, Bld 135 (H) 70 - 99 mg/dL    Comment: Glucose reference range applies only to samples taken after fasting for at least 8 hours.   BUN 16 8 - 23 mg/dL   Creatinine, Ser 4.090.91 0.44 - 1.00 mg/dL   Calcium 8.7 (L) 8.9 - 10.3 mg/dL   GFR, Estimated >81>60 >19>60 mL/min    Comment: (NOTE) Calculated using the CKD-EPI Creatinine Equation (2021)    Anion gap 10 5 - 15    Comment: Performed at [facility], [address] [place], [place] [address]  Magnesium     Status: None   Collection Time: 08/06/21  4:20 PM  Result Value Ref Range   Magnesium 2.3 1.7 - 2.4 mg/dL    Comment: Performed at [facility], [address] [place], [place] [address]  CBC     Status: Abnormal   Collection Time: 08/06/21  4:20 PM  Result Value Ref Range   WBC 13.6 (H) 4.0 - 10.5 K/uL   RBC 2.86 (L) 3.87 - 5.11 MIL/uL   Hemoglobin 9.1 (L) 12.0 - 15.0 g/dL   HCT 30.827.0 (L) 65.736.0 - 84.646.0 %   MCV 94.4 80.0 - 100.0 fL   MCH 31.8 26.0 - 34.0 pg   MCHC 33.7 30.0 - 36.0 g/dL   RDW 96.212.9 95.211.5 - 84.115.5 %   Platelets 154 150 - 400 K/uL   nRBC 0.0 0.0 - 0.2 %    Comment: Performed at [facility], [address] [place], [place] [id number]  Glucose, capillary     Status: Abnormal   Collection Time: 08/06/21  7:31 PM  Result Value Ref Range   Glucose-Capillary 118 (H) 70 - 99 mg/dL     Comment: Glucose reference range applies only to samples taken after fasting for at least 8 hours.  Glucose, capillary     Status: Abnormal   Collection Time: 08/06/21 11:52 PM  Result Value Ref Range   Glucose-Capillary 139 (H) 70 - 99 mg/dL    Comment: Glucose reference range applies only to samples taken after fasting for at least 8 hours.  Basic metabolic panel     Status: Abnormal   Collection Time: 08/07/21  4:33 AM  Result Value Ref Range   Sodium 135 135 - 145 mmol/L   Potassium 3.9 3.5 - 5.1 mmol/L   Chloride 103 98 - 111 mmol/L   CO2 25 22 - 32 mmol/L   Glucose, Bld 126 (H) 70 - 99 mg/dL    Comment: Glucose reference range applies only to samples taken after fasting for at least 8 hours.   BUN 20 8 - 23 mg/dL   Creatinine, Ser 1.020.81 0.44 - 1.00 mg/dL   Calcium 8.3 (L) 8.9 - 10.3 mg/dL   GFR, Estimated >72>60 >53>60 mL/min    Comment: (NOTE) Calculated using the CKD-EPI Creatinine Equation (2021)    Anion gap 7 5 - 15    Comment: Performed at [facility], [address] [place], [place] [id number]  CBC     Status: Abnormal   Collection Time: 08/07/21  4:33 AM  Result Value Ref Range   WBC 11.2 (H) 4.0 - 10.5 K/uL   RBC 2.55 (L) 3.87 - 5.11 MIL/uL   Hemoglobin 8.1 (L) 12.0 - 15.0 g/dL   HCT 34.724.6 (L) 42.536.0 - 95.646.0 %   MCV 96.5 80.0 - 100.0 fL  MCH 31.8 26.0 - 34.0 pg   MCHC 32.9 30.0 - 36.0 g/dL   RDW 13.2 11.5 - 15.5 %   Platelets 148 (L) 150 - 400 K/uL   nRBC 0.0 0.0 - 0.2 %    Comment: Performed at [facility], [place] [address] [place], [place] [address]  Glucose, capillary     Status: Abnormal   Collection Time: 08/07/21  4:36 AM  Result Value Ref Range   Glucose-Capillary 112 (H) 70 - 99 mg/dL    Comment: Glucose reference range applies only to samples taken after fasting for at least 8 hours.    ECG   N/A - Personally Reviewed  Telemetry   Afib with CVR - Personally Reviewed  Radiology    DG Chest Port 1 View  Result Date: 08/06/2021 CLINICAL  DATA:  Status post coronary artery bypass grafting. EXAM: PORTABLE CHEST 1 VIEW COMPARISON:  Chest radiograph dated August 05, 2021. FINDINGS: Interval removal of the endotracheal tube. Right IJ access swans Ganz catheter with distal tip in the right ventricle outflow tract, unchanged. Left-sided chest tube and mediastinal drain are unchanged. The heart is enlarged. Low lung volumes. Left basilar atelectasis and/or small effusion. No evidence of pneumothorax. Sternotomy wires are intact. No acute osseous abnormality. IMPRESSION: 1.  Lines and tubes as above. 2. Cardiomegaly with left basilar atelectasis and/small effusion, unchanged. No pneumothorax. Electronically Signed   By: Keane Police D.O.   On: 08/06/2021 08:11   DG Chest Port 1 View  Result Date: 08/05/2021 CLINICAL DATA:  Postop CABG. EXAM: PORTABLE CHEST 1 VIEW COMPARISON:  Radiographs 08/01/2021 and 07/15/2021. FINDINGS: 1220 hours. Interval median sternotomy and CABG. Endotracheal tube tip is 1.6 cm above the carina. Enteric tube projects below the diaphragm, tip not visualized. Right IJ Swan-Ganz catheter terminates in the right ventricular outflow tract or main pulmonary artery. Mediastinal drain and left chest tubes are in place. There is mild left lower lobe atelectasis and a possible small left pleural effusion. No evidence of pneumothorax or edema. IMPRESSION: Interval CABG without demonstrated complication. Mild left lower lobe atelectasis and possible small left pleural effusion. Electronically Signed   By: Richardean Sale M.D.   On: 08/05/2021 12:31    Cardiac Studies   Above  Assessment   Principal Problem:   NSTEMI (non-ST elevated myocardial infarction) [facility]) Active Problems:   S/P CABG x 3   Plan   Ms. Balcerzak continues to improve, but went into afib overnight- now back in sinus for short periods of time alternating with afib. Continue IV amiodarone load, may convert to po tomorrow. Suspect she will go back into sinus.  Needs diuresis. Admit weight was 70 kg -now 73.4 kg, will start lasix 40 mg IV daily.  Time Spent Directly with Patient:  I have spent a total of 25 minutes with the patient reviewing hospital notes, telemetry, EKGs, labs and examining the patient as well as establishing an assessment and plan that was discussed personally with the patient.  > 50% of time was spent in direct patient care.  Length of Stay:  LOS: 6 days   Pixie Casino, MD, [facility], [place] Director of the Advanced Lipid Disorders &  Cardiovascular Risk Reduction Clinic Diplomate of the American Board of Clinical Lipidology Attending Cardiologist  Direct Dial: [phone]   Fax: [phone]  Website:  [email or web address]  Nadean Corwin Zelphia Glover 08/07/2021, 8:10 AM

## 2021-08-08 ENCOUNTER — Inpatient Hospital Stay (HOSPITAL_COMMUNITY): Payer: Medicare Other

## 2021-08-08 ENCOUNTER — Encounter (HOSPITAL_COMMUNITY): Payer: Self-pay | Admitting: Surgery

## 2021-08-08 LAB — GLUCOSE, CAPILLARY
Glucose-Capillary: 114 mg/dL — ABNORMAL HIGH (ref 70–99)
Glucose-Capillary: 140 mg/dL — ABNORMAL HIGH (ref 70–99)
Glucose-Capillary: 127 mg/dL — ABNORMAL HIGH (ref 70–99)
Glucose-Capillary: 101 mg/dL — ABNORMAL HIGH (ref 70–99)
Glucose-Capillary: 96 mg/dL (ref 70–99)

## 2021-08-08 LAB — TYPE AND SCREEN
ABO/RH(D): O POS
Antibody Screen: NEGATIVE
Unit division: 0
Unit division: 0

## 2021-08-08 LAB — BPAM RBC
Blood Product Expiration Date: 202303032359
Blood Product Expiration Date: 202303042359
ISSUE DATE / TIME: 202302030953
ISSUE DATE / TIME: 202302030953
Unit Type and Rh: 5100
Unit Type and Rh: 5100

## 2021-08-08 LAB — BASIC METABOLIC PANEL
Anion gap: 7 (ref 5–15)
BUN: 18 mg/dL (ref 8–23)
CO2: 26 mmol/L (ref 22–32)
Calcium: 8.1 mg/dL — ABNORMAL LOW (ref 8.9–10.3)
Chloride: 98 mmol/L (ref 98–111)
Creatinine, Ser: 0.85 mg/dL (ref 0.44–1.00)
GFR, Estimated: >60 mL/min (ref >60)
Glucose, Bld: 120 mg/dL — ABNORMAL HIGH (ref 70–99)
Potassium: 3.6 mmol/L (ref 3.5–5.1)
Sodium: 131 mmol/L — ABNORMAL LOW (ref 135–145)

## 2021-08-08 LAB — CBC
HCT: 24.3 % — ABNORMAL LOW (ref 36.0–46.0)
Hemoglobin: 7.8 g/dL — ABNORMAL LOW (ref 12.0–15.0)
MCH: 31.3 pg (ref 26.0–34.0)
MCHC: 32.1 g/dL (ref 30.0–36.0)
MCV: 97.6 fL (ref 80.0–100.0)
Platelets: 154 10*3/uL (ref 150–400)
RBC: 2.49 MIL/uL — ABNORMAL LOW (ref 3.87–5.11)
RDW: 13.2 % (ref 11.5–15.5)
WBC: 11.6 10*3/uL — ABNORMAL HIGH (ref 4.0–10.5)
nRBC: 0 % (ref 0.0–0.2)

## 2021-08-08 MED ORDER — INSULIN ASPART 100 UNIT/ML IJ SOLN
0.0000 [IU] | Freq: Three times a day (TID) | INTRAMUSCULAR | Status: DC
  Administered 2021-08-08: 2 [IU] via SUBCUTANEOUS

## 2021-08-08 MED ORDER — AMIODARONE HCL 200 MG PO TABS
400.0000 mg | ORAL_TABLET | Freq: Two times a day (BID) | ORAL | Status: DC
  Administered 2021-08-08 – 2021-08-09 (×3): 400 mg via ORAL
  Filled 2021-08-08 (×3): qty 2

## 2021-08-08 MED ORDER — ~~LOC~~ CARDIAC SURGERY, PATIENT & FAMILY EDUCATION: Freq: Once | Status: AC

## 2021-08-08 MED ORDER — SODIUM CHLORIDE 0.9% FLUSH
3.0000 mL | Freq: Two times a day (BID) | INTRAVENOUS | Status: DC
  Administered 2021-08-08 – 2021-08-09 (×3): 3 mL via INTRAVENOUS

## 2021-08-08 MED ORDER — SODIUM CHLORIDE 0.9% FLUSH: 3.0000 mL | INTRAVENOUS | Status: DC | PRN

## 2021-08-08 MED ORDER — AMIODARONE IV BOLUS ONLY 150 MG/100ML
150.0000 mg | Freq: Once | INTRAVENOUS | Status: AC
  Administered 2021-08-08: 150 mg via INTRAVENOUS
  Filled 2021-08-08: qty 100

## 2021-08-08 MED ORDER — POTASSIUM CHLORIDE CRYS ER 20 MEQ PO TBCR
20.0000 meq | EXTENDED_RELEASE_TABLET | ORAL | Status: AC
  Administered 2021-08-08 (×3): 20 meq via ORAL
  Filled 2021-08-08 (×3): qty 1

## 2021-08-08 MED ORDER — SODIUM CHLORIDE 0.9 % IV SOLN: 250.0000 mL | INTRAVENOUS | Status: DC | PRN

## 2021-08-08 MED ORDER — FE FUMARATE-B12-VIT C-FA-IFC PO CAPS
1.0000 | ORAL_CAPSULE | Freq: Two times a day (BID) | ORAL | Status: DC
  Administered 2021-08-08 – 2021-08-11 (×7): 1 via ORAL
  Filled 2021-08-08 (×7): qty 1

## 2021-08-08 MED ORDER — MAGNESIUM HYDROXIDE 400 MG/5ML PO SUSP: 30.0000 mL | Freq: Every day | ORAL | Status: DC | PRN

## 2021-08-08 MED FILL — Magnesium Sulfate Inj 50%: INTRAMUSCULAR | Qty: 10 | Status: AC

## 2021-08-08 MED FILL — Potassium Chloride Inj 2 mEq/ML: INTRAVENOUS | Qty: 40 | Status: AC

## 2021-08-08 MED FILL — Heparin Sodium (Porcine) Inj 1000 Unit/ML: Qty: 1000 | Status: AC

## 2021-08-08 NOTE — Progress Notes (Signed)
[address]       [place] [address]             [phone]      3 Days Post-Op Procedure(s) (LRB): CORONARY ARTERY BYPASS GRAFTING (CABG) TIMES THREE, USING LEFT INTERNAL MAMMARTY ARTERY AND ENDOSCOPICALLY HARVESTED RIGHT GREATER SAPHENOUS VEIN (N/A) TRANSESOPHAGEAL ECHOCARDIOGRAM (TEE) (N/A) ENDOVEIN HARVEST OF GREATER SAPHENOUS VEIN (Right) Subjective: C/p mild constipation , no abdominal pain, + flatus  Objective: Vital signs in last 24 hours: Temp:  [97.9 F (36.6 C)-98.6 F (37 C)] 98.6 F (37 C) (02/06 0400) Pulse Rate:  [63-96] 79 (02/06 0800) Cardiac Rhythm: Normal sinus rhythm (02/06 0800) Resp:  [11-28] 17 (02/06 0800) BP: (84-123)/(53-75) 103/61 (02/06 0630) SpO2:  [89 %-99 %] 93 % (02/06 0800) Weight:  [72.2 kg] 72.2 kg (02/06 0600)  Hemodynamic parameters for last 24 hours:    Intake/Output from previous day: 02/05 0701 - 02/06 0700 In: 892.2 [P.O.:100; I.V.:692.2; IV Piggyback:100] Out: 1950 [id number] Intake/Output this shift: Total I/O In: 16.7 [I.V.:16.7] Out: -   General appearance: alert, cooperative, and no distress Heart: regular rate and rhythm Lungs: mildly dim on bases Abdomen: benign Extremities: minor edema Wound: incis healing well( EVH) with mod echymosis, chest dressing intact  Lab Results: Recent Labs    08/07/21 0433 08/08/21 0406  WBC 11.2* 11.6*  HGB 8.1* 7.8*  HCT 24.6* 24.3*  PLT 148* 154   BMET:  Recent Labs    08/07/21 0433 08/08/21 0406  NA 135 131*  K 3.9 3.6  CL 103 98  CO2 25 26  GLUCOSE 126* 120*  BUN 20 18  CREATININE 0.81 0.85  CALCIUM 8.3* 8.1*    PT/INR:  Recent Labs    08/05/21 1223  LABPROT 16.1*  INR 1.3*   ABG    Component Value Date/Time   PHART 7.392 08/05/2021 2100   HCO3 23.3 08/05/2021 2100   TCO2 24 08/05/2021 2100   ACIDBASEDEF 2.0 08/05/2021 2100   O2SAT 99.0 08/05/2021 2100   CBG (last 3)  Recent Labs    08/07/21 2315 08/08/21 0404  08/08/21 0807  GLUCAP 105* 114* 140*    Meds Scheduled Meds:  acetaminophen  1,000 mg Oral Q6H   Or   acetaminophen (TYLENOL) oral liquid 160 mg/5 mL  1,000 mg Per Tube Q6H   aspirin EC  325 mg Oral Daily   Or   aspirin  324 mg Per Tube Daily   bisacodyl  10 mg Oral Daily   Or   bisacodyl  10 mg Rectal Daily   Chlorhexidine Gluconate Cloth  6 each Topical Daily   docusate sodium  200 mg Oral Daily   enoxaparin (LOVENOX) injection  30 mg Subcutaneous QHS   furosemide  40 mg Intravenous Daily   hydrocortisone cream   Topical BID   insulin aspart  0-24 Units Subcutaneous Q4H   mouth rinse  15 mL Mouth Rinse BID   metoprolol tartrate  25 mg Oral BID   pantoprazole  40 mg Oral Daily   potassium chloride  20 mEq Oral Q4H   sodium chloride flush  10-40 mL Intracatheter Q12H   sodium chloride flush  3 mL Intravenous Q12H   Continuous Infusions:  sodium chloride Stopped (08/08/21 0402)   sodium chloride 250 mL (08/06/21 0503)   sodium chloride     amiodarone 30 mg/hr (08/08/21 0800)   lactated ringers     lactated ringers Stopped (08/06/21 0955)   promethazine (  PHENERGAN) injection (IM or IVPB) Stopped (08/06/21 1518)   PRN Meds:.sodium chloride, metoprolol tartrate, midazolam, morphine injection, ondansetron (ZOFRAN) IV, oxyCODONE, promethazine (PHENERGAN) injection (IM or IVPB), sodium chloride flush, sodium chloride flush, traMADol  Xrays DG Chest Port 1 View  Result Date: 08/07/2021 CLINICAL DATA:  Follow-up pleural effusion.  Status post CABG. EXAM: PORTABLE CHEST 1 VIEW COMPARISON:  08/06/2021 FINDINGS: Status post median sternotomy and CABG procedure. Pulmonary arterial catheter has been removed. The right IJ Cordis projects over the distal SVC. Stable mediastinal drain and left chest tube without pneumothorax. Small residual pleural effusions identified. Left base atelectasis appears improved from previous exam. IMPRESSION: 1. Small residual pleural effusions. 2. Improved  aeration to the left base. Electronically Signed   By: Kerby Moors M.D.   On: 08/07/2021 08:09    Assessment/Plan: S/P Procedure(s) (LRB): CORONARY ARTERY BYPASS GRAFTING (CABG) TIMES THREE, USING LEFT INTERNAL MAMMARTY ARTERY AND ENDOSCOPICALLY HARVESTED RIGHT GREATER SAPHENOUS VEIN (N/A) TRANSESOPHAGEAL ECHOCARDIOGRAM (TEE) (N/A) ENDOVEIN HARVEST OF GREATER SAPHENOUS VEIN (Right) POD#3  1 afeb, VSS S BP 84-123, on amio gtt for afib- convert to po soon, current in sinus rhythm, no inotrope/pressors 2 sats ok on RA 3 good UOP, weight up approx 4 kg from preop, normal renal fxn, conts lasix 4 minor leukocytosis, stable 5 expected ABLA stable hgb 7.8- start trinsicon 6 CXR - bibasilar atx- push pulm hygiene/rehab as able 7 BS adeq controlled- A1C 5.0 on no meds    LOS: 7 days    John Giovanni PA-C Pager [phone] 08/08/2021

## 2021-08-08 NOTE — Progress Notes (Addendum)
Patient converted to atrial fib on monitor, rate 115-120s,  patient states she is "somewhat" light headed can feel her heart rate when it changed. Bp 116/54. Naveen Lorusso, Randall An rN   [facility] made aware and orders received. Amanie Mcculley, Randall An RN

## 2021-08-08 NOTE — Progress Notes (Signed)
Came earlier for ambulation but pt was eating. Ambulated second walk with RN before returning to bed. Looks good. Encouraged IS and a 3rd walk this evening. [phone] Ethelda Chick CES, ACSM 1:31 PM 08/08/2021

## 2021-08-08 NOTE — Progress Notes (Signed)
Pt arrived to 4e from 2h. Pt oriented to room and staff. CHG bath completed. Vitals obtained. Telemetry box applied and CCMD notified x2 verifiers. Pt assisted to chair per pt request.

## 2021-08-09 ENCOUNTER — Inpatient Hospital Stay (HOSPITAL_COMMUNITY): Payer: Medicare Other

## 2021-08-09 LAB — BASIC METABOLIC PANEL
Anion gap: 8 (ref 5–15)
BUN: 22 mg/dL (ref 8–23)
CO2: 26 mmol/L (ref 22–32)
Calcium: 8.6 mg/dL — ABNORMAL LOW (ref 8.9–10.3)
Chloride: 101 mmol/L (ref 98–111)
Creatinine, Ser: 1.08 mg/dL — ABNORMAL HIGH (ref 0.44–1.00)
GFR, Estimated: 55 mL/min — ABNORMAL LOW (ref >60)
Glucose, Bld: 171 mg/dL — ABNORMAL HIGH (ref 70–99)
Potassium: 4.5 mmol/L (ref 3.5–5.1)
Sodium: 135 mmol/L (ref 135–145)

## 2021-08-09 LAB — GLUCOSE, CAPILLARY
Glucose-Capillary: 107 mg/dL — ABNORMAL HIGH (ref 70–99)
Glucose-Capillary: 119 mg/dL — ABNORMAL HIGH (ref 70–99)
Glucose-Capillary: 110 mg/dL — ABNORMAL HIGH (ref 70–99)
Glucose-Capillary: 127 mg/dL — ABNORMAL HIGH (ref 70–99)

## 2021-08-09 LAB — CBC
HCT: 25.5 % — ABNORMAL LOW (ref 36.0–46.0)
Hemoglobin: 8.3 g/dL — ABNORMAL LOW (ref 12.0–15.0)
MCH: 31.2 pg (ref 26.0–34.0)
MCHC: 32.5 g/dL (ref 30.0–36.0)
MCV: 95.9 fL (ref 80.0–100.0)
Platelets: 194 10*3/uL (ref 150–400)
RBC: 2.66 MIL/uL — ABNORMAL LOW (ref 3.87–5.11)
RDW: 13 % (ref 11.5–15.5)
WBC: 12.8 10*3/uL — ABNORMAL HIGH (ref 4.0–10.5)
nRBC: 0.2 % (ref 0.0–0.2)

## 2021-08-09 MED ORDER — ROSUVASTATIN CALCIUM 20 MG PO TABS
20.0000 mg | ORAL_TABLET | Freq: Every day | ORAL | Status: DC
  Administered 2021-08-09 – 2021-08-11 (×3): 20 mg via ORAL
  Filled 2021-08-09 (×3): qty 1

## 2021-08-09 MED ORDER — AMIODARONE HCL 200 MG PO TABS
200.0000 mg | ORAL_TABLET | Freq: Two times a day (BID) | ORAL | Status: DC
  Administered 2021-08-09 – 2021-08-11 (×4): 200 mg via ORAL
  Filled 2021-08-09 (×4): qty 1

## 2021-08-09 MED ORDER — CLOPIDOGREL BISULFATE 75 MG PO TABS
75.0000 mg | ORAL_TABLET | Freq: Every day | ORAL | Status: DC
  Administered 2021-08-09 – 2021-08-11 (×3): 75 mg via ORAL
  Filled 2021-08-09 (×3): qty 1

## 2021-08-09 MED ORDER — ASPIRIN EC 81 MG PO TBEC
81.0000 mg | DELAYED_RELEASE_TABLET | Freq: Every day | ORAL | Status: DC
  Administered 2021-08-10 – 2021-08-11 (×2): 81 mg via ORAL
  Filled 2021-08-09 (×2): qty 1

## 2021-08-09 MED FILL — Thrombin (Recombinant) For Soln 20000 Unit: CUTANEOUS | Qty: 1 | Status: AC

## 2021-08-09 NOTE — Progress Notes (Signed)
No changes from previous assessment. °

## 2021-08-09 NOTE — Care Management Important Message (Signed)
Important Message  Patient Details  Name: Tiffany Odonnell MRN: [id number] Date of Birth: 30-Nov-1950   Medicare Important Message Given:  Yes     Shelda Altes 08/09/2021, 9:30 AM

## 2021-08-09 NOTE — Progress Notes (Signed)
CARDIAC REHAB PHASE I   PRE:  Rate/Rhythm: 72 SR    BP: sitting 110/61    SaO2: 97 RA  MODE:  Ambulation: 370 ft   POST:  Rate/Rhythm: 92 SR    BP: sitting 118/78     SaO2: 97 RA  Pt stood independently and walked with RW. Slow and steady, fatigue with distance. Rest x2. Return to recliner, no afib. Encouraged x2 more walks.  [phone]   Tiffany Odonnell CES, ACSM 08/09/2021 10:19 AM

## 2021-08-09 NOTE — Plan of Care (Signed)
°  Problem: Education: Goal: Knowledge of General Education information will improve Description: Including pain rating scale, medication(s)/side effects and non-pharmacologic comfort measures Outcome: Progressing   Problem: Health Behavior/Discharge Planning: Goal: Ability to manage health-related needs will improve Outcome: Progressing   Problem: Clinical Measurements: Goal: Ability to maintain clinical measurements within normal limits will improve Outcome: Progressing Goal: Will remain free from infection Outcome: Progressing Goal: Diagnostic test results will improve Outcome: Progressing Goal: Respiratory complications will improve Outcome: Progressing Goal: Cardiovascular complication will be avoided Outcome: Progressing   Problem: Nutrition: Goal: Adequate nutrition will be maintained Outcome: Progressing   Problem: Coping: Goal: Level of anxiety will decrease Outcome: Progressing   Problem: Elimination: Goal: Will not experience complications related to bowel motility Outcome: Progressing Goal: Will not experience complications related to urinary retention Outcome: Progressing   Problem: Pain Managment: Goal: General experience of comfort will improve Outcome: Progressing   Problem: Safety: Goal: Ability to remain free from injury will improve Outcome: Progressing   Problem: Skin Integrity: Goal: Risk for impaired skin integrity will decrease Outcome: Progressing   Problem: Education: Goal: Understanding of cardiac disease, CV risk reduction, and recovery process will improve Outcome: Progressing Goal: Individualized Educational Video(s) Outcome: Progressing   Problem: Activity: Goal: Ability to tolerate increased activity will improve Outcome: Progressing   Problem: Cardiac: Goal: Ability to achieve and maintain adequate cardiovascular perfusion will improve Outcome: Progressing   Problem: Health Behavior/Discharge Planning: Goal: Ability to  safely manage health-related needs after discharge will improve Outcome: Progressing   Problem: Education: Goal: Understanding of CV disease, CV risk reduction, and recovery process will improve Outcome: Progressing Goal: Individualized Educational Video(s) Outcome: Progressing   Problem: Activity: Goal: Ability to return to baseline activity level will improve Outcome: Progressing   Problem: Cardiovascular: Goal: Ability to achieve and maintain adequate cardiovascular perfusion will improve Outcome: Progressing   Problem: Health Behavior/Discharge Planning: Goal: Ability to safely manage health-related needs after discharge will improve Outcome: Progressing   Problem: Education: Goal: Will demonstrate proper wound care and an understanding of methods to prevent future damage Outcome: Progressing Goal: Knowledge of disease or condition will improve Outcome: Progressing Goal: Knowledge of the prescribed therapeutic regimen will improve Outcome: Progressing Goal: Individualized Educational Video(s) Outcome: Progressing   Problem: Activity: Goal: Risk for activity intolerance will decrease Outcome: Progressing   Problem: Cardiac: Goal: Will achieve and/or maintain hemodynamic stability Outcome: Progressing   Problem: Clinical Measurements: Goal: Postoperative complications will be avoided or minimized Outcome: Progressing   Problem: Respiratory: Goal: Respiratory status will improve Outcome: Progressing   Problem: Skin Integrity: Goal: Wound healing without signs and symptoms of infection Outcome: Progressing Goal: Risk for impaired skin integrity will decrease Outcome: Progressing   Problem: Urinary Elimination: Goal: Ability to achieve and maintain adequate renal perfusion and functioning will improve Outcome: Progressing

## 2021-08-09 NOTE — Progress Notes (Signed)
[address]       [place] [address]             [phone]      4 Days Post-Op Procedure(s) (LRB): CORONARY ARTERY BYPASS GRAFTING (CABG) TIMES THREE, USING LEFT INTERNAL MAMMARTY ARTERY AND ENDOSCOPICALLY HARVESTED RIGHT GREATER SAPHENOUS VEIN (N/A) TRANSESOPHAGEAL ECHOCARDIOGRAM (TEE) (N/A) ENDOVEIN HARVEST OF GREATER SAPHENOUS VEIN (Right) Subjective: Feeling much netter, + BM, appetite improving  Objective: Vital signs in last 24 hours: Temp:  [97.8 F (36.6 C)-99.2 F (37.3 C)] 98.6 F (37 C) (02/07 [id number]) Pulse Rate:  [69-95] 73 (02/07 0608) Cardiac Rhythm: Normal sinus rhythm (02/07 0333) Resp:  [13-25] 19 (02/07 0608) BP: (91-128)/(54-80) 100/61 (02/07 0608) SpO2:  [93 %-96 %] 95 % (02/07 0608) FiO2 (%):  [21 %] 21 % (02/06 1015) Weight:  [70.4 kg] 70.4 kg (02/07 0608)  Hemodynamic parameters for last 24 hours:    Intake/Output from previous day: 02/06 0701 - 02/07 0700 In: 952.8 [P.O.:740; I.V.:212.8] Out: 0  Intake/Output this shift: No intake/output data recorded.  General appearance: alert, cooperative, and no distress Heart: regular rate and rhythm Lungs: dim in bases Abdomen: benign Extremities: edemais improved Wound: incis healing well without signs of infection  Lab Results: Recent Labs    08/08/21 0406 08/09/21 0053  WBC 11.6* 12.8*  HGB 7.8* 8.3*  HCT 24.3* 25.5*  PLT 154 194   BMET:  Recent Labs    08/08/21 0406 08/09/21 0053  NA 131* 135  K 3.6 4.5  CL 98 101  CO2 26 26  GLUCOSE 120* 171*  BUN 18 22  CREATININE 0.85 1.08*  CALCIUM 8.1* 8.6*    PT/INR: No results for input(s): LABPROT, INR in the last 72 hours. ABG    Component Value Date/Time   PHART 7.392 08/05/2021 2100   HCO3 23.3 08/05/2021 2100   TCO2 24 08/05/2021 2100   ACIDBASEDEF 2.0 08/05/2021 2100   O2SAT 99.0 08/05/2021 2100   CBG (last 3)  Recent Labs    08/08/21 1631 08/08/21 2246 08/09/21 0605  GLUCAP 101* 96 107*     Meds Scheduled Meds:  acetaminophen  1,000 mg Oral Q6H   Or   acetaminophen (TYLENOL) oral liquid 160 mg/5 mL  1,000 mg Per Tube Q6H   amiodarone  400 mg Oral BID   aspirin EC  325 mg Oral Daily   Or   aspirin  324 mg Per Tube Daily   bisacodyl  10 mg Oral Daily   Or   bisacodyl  10 mg Rectal Daily   Chlorhexidine Gluconate Cloth  6 each Topical Daily   docusate sodium  200 mg Oral Daily   enoxaparin (LOVENOX) injection  30 mg Subcutaneous QHS   ferrous Q000111Q C-folic acid  1 capsule Oral BID PC   furosemide  40 mg Intravenous Daily   hydrocortisone cream   Topical BID   insulin aspart  0-24 Units Subcutaneous TID AC & HS   mouth rinse  15 mL Mouth Rinse BID   metoprolol tartrate  25 mg Oral BID   pantoprazole  40 mg Oral Daily   sodium chloride flush  10-40 mL Intracatheter Q12H   sodium chloride flush  3 mL Intravenous Q12H   sodium chloride flush  3 mL Intravenous Q12H   Continuous Infusions:  sodium chloride Stopped (08/08/21 0402)   sodium chloride 250 mL (08/06/21 0503)   sodium chloride     sodium chloride  lactated ringers     lactated ringers Stopped (08/06/21 0955)   promethazine (PHENERGAN) injection (IM or IVPB) Stopped (08/06/21 1518)   PRN Meds:.sodium chloride, sodium chloride, magnesium hydroxide, metoprolol tartrate, midazolam, morphine injection, ondansetron (ZOFRAN) IV, oxyCODONE, promethazine (PHENERGAN) injection (IM or IVPB), sodium chloride flush, sodium chloride flush, sodium chloride flush, traMADol  Xrays DG Chest Port 1 View  Result Date: 08/08/2021 CLINICAL DATA:  Postop from open heart surgery. EXAM: PORTABLE CHEST 1 VIEW COMPARISON:  08/07/2021 FINDINGS: Left-sided chest tube and mediastinal drains have been removed since prior exam. No pneumothorax visualized. Right jugular Cordis remains in place. Stable mild cardiomegaly. Bibasilar atelectasis shows mild worsening since prior study. IMPRESSION: Postop chest. No  pneumothorax visualized. Mild worsening of bibasilar atelectasis. Electronically Signed   By: Marlaine Hind M.D.   On: 08/08/2021 08:13    Assessment/Plan: S/P Procedure(s) (LRB): CORONARY ARTERY BYPASS GRAFTING (CABG) TIMES THREE, USING LEFT INTERNAL MAMMARTY ARTERY AND ENDOSCOPICALLY HARVESTED RIGHT GREATER SAPHENOUS VEIN (N/A) TRANSESOPHAGEAL ECHOCARDIOGRAM (TEE) (N/A) ENDOVEIN HARVEST OF GREATER SAPHENOUS VEIN (Right)  POD#4  1 Tmax 99.2, Sbp 91-128, sinus rhythm currently, some afib with CVR last evening- now on po amio 2 sats good on RA 3 good UOP , not measured but frequent voids, weight approaching preop. Normal renal fxn 4 slightly increased leukocytosis w/ WBC 12.8 5 H/H improved w/equalibration 6 BS fine, A1c 5.9 on no preop meds 7 CXR improving atx , minor blunting of CP angle c/w s,mall effusions 8 cont pulm hygiene and cardiac rehab, hopefully home 1-2 days if no new issues  LOS: 8 days    John Giovanni PA-C Pager [phone] 08/09/2021

## 2021-08-10 LAB — GLUCOSE, CAPILLARY: Glucose-Capillary: 130 mg/dL — ABNORMAL HIGH (ref 70–99)

## 2021-08-10 NOTE — Progress Notes (Addendum)
[address]       [place] [address]             [phone]      5 Days Post-Op Procedure(s) (LRB): CORONARY ARTERY BYPASS GRAFTING (CABG) TIMES THREE, USING LEFT INTERNAL MAMMARTY ARTERY AND ENDOSCOPICALLY HARVESTED RIGHT GREATER SAPHENOUS VEIN (N/A) TRANSESOPHAGEAL ECHOCARDIOGRAM (TEE) (N/A) ENDOVEIN HARVEST OF GREATER SAPHENOUS VEIN (Right) Subjective: Feels well  Objective: Vital signs in last 24 hours: Temp:  [98.4 F (36.9 C)-98.8 F (37.1 C)] 98.8 F (37.1 C) (02/07 1952) Pulse Rate:  [73-86] 86 (02/07 1952) Cardiac Rhythm: Normal sinus rhythm (02/07 2056) Resp:  [17-20] 20 (02/07 1952) BP: (95-127)/(50-81) 127/81 (02/07 1952) SpO2:  [95 %-98 %] 96 % (02/07 1952)  Hemodynamic parameters for last 24 hours:    Intake/Output from previous day: 02/07 0701 - 02/08 0700 In: 480 [P.O.:480] Out: 650 [Urine:650] Intake/Output this shift: No intake/output data recorded.  General appearance: alert, cooperative, and no distress Heart: regular rate and rhythm Lungs: clear to auscultation bilaterally Abdomen: benign Extremities: no edema Wound: incis healing well  Lab Results: Recent Labs    08/08/21 0406 08/09/21 0053  WBC 11.6* 12.8*  HGB 7.8* 8.3*  HCT 24.3* 25.5*  PLT 154 194   BMET:  Recent Labs    08/08/21 0406 08/09/21 0053  NA 131* 135  K 3.6 4.5  CL 98 101  CO2 26 26  GLUCOSE 120* 171*  BUN 18 22  CREATININE 0.85 1.08*  CALCIUM 8.1* 8.6*    PT/INR: No results for input(s): LABPROT, INR in the last 72 hours. ABG    Component Value Date/Time   PHART 7.392 08/05/2021 2100   HCO3 23.3 08/05/2021 2100   TCO2 24 08/05/2021 2100   ACIDBASEDEF 2.0 08/05/2021 2100   O2SAT 99.0 08/05/2021 2100   CBG (last 3)  Recent Labs    08/09/21 1157 08/09/21 1603 08/09/21 2038  GLUCAP 119* 110* 127*    Meds Scheduled Meds:  acetaminophen  1,000 mg Oral Q6H   Or   acetaminophen (TYLENOL) oral liquid 160 mg/5 mL  1,000 mg  Per Tube Q6H   amiodarone  200 mg Oral BID   aspirin EC  81 mg Oral Daily   bisacodyl  10 mg Oral Daily   Or   bisacodyl  10 mg Rectal Daily   Chlorhexidine Gluconate Cloth  6 each Topical Daily   clopidogrel  75 mg Oral Daily   docusate sodium  200 mg Oral Daily   enoxaparin (LOVENOX) injection  30 mg Subcutaneous QHS   ferrous Q000111Q C-folic acid  1 capsule Oral BID PC   hydrocortisone cream   Topical BID   mouth rinse  15 mL Mouth Rinse BID   metoprolol tartrate  25 mg Oral BID   pantoprazole  40 mg Oral Daily   rosuvastatin  20 mg Oral Daily   sodium chloride flush  3 mL Intravenous Q12H   sodium chloride flush  3 mL Intravenous Q12H   Continuous Infusions:  sodium chloride Stopped (08/08/21 0402)   sodium chloride 250 mL (08/06/21 0503)   sodium chloride     lactated ringers     lactated ringers Stopped (08/06/21 0955)   promethazine (PHENERGAN) injection (IM or IVPB) Stopped (08/06/21 1518)   PRN Meds:.sodium chloride, sodium chloride, magnesium hydroxide, metoprolol tartrate, morphine injection, ondansetron (ZOFRAN) IV, oxyCODONE, promethazine (PHENERGAN) injection (IM or IVPB), sodium chloride flush, sodium chloride flush, sodium chloride flush, traMADol  Xrays  DG Chest 2 View  Result Date: 08/09/2021 CLINICAL DATA:  History of CABG EXAM: CHEST - 2 VIEW COMPARISON:  08/08/2021 FINDINGS: Interval removal of right IJ sheath. Stable heart size status post CABG. Left basilar atelectasis. Overall improving aeration of the lung bases. No pneumothorax. IMPRESSION: Improving aeration of the lung bases. Electronically Signed   By: Davina Poke D.O.   On: 08/09/2021 08:17    Assessment/Plan: S/P Procedure(s) (LRB): CORONARY ARTERY BYPASS GRAFTING (CABG) TIMES THREE, USING LEFT INTERNAL MAMMARTY ARTERY AND ENDOSCOPICALLY HARVESTED RIGHT GREATER SAPHENOUS VEIN (N/A) TRANSESOPHAGEAL ECHOCARDIOGRAM (TEE) (N/A) ENDOVEIN HARVEST OF GREATER SAPHENOUS VEIN  (Right)  POD#5 1 afeb, VSS s BP 90's-120's, maintaining sinus rhythm 2 sats good on RA 3 not weighed yet today, UOP appears adeq 4 no new labs/CXR 5 BS controlled, not a diabetic 6 home soon, daughter should be in town tomorrow    LOS: 9 days    John Giovanni PA-C Pager [phone] 08/10/2021    Chart reviewed, patient examined, agree with above. She is doing well. Wt is about 3 lbs over preop. She can go home tomorrow with her daughter. I would resume Atenolol 50 mg daily, indapamide 2.5 mg, potassium replacement and amiodarone 200 bid at discharge. I will decrease amio to 200 mg daily when I see her in the office.

## 2021-08-10 NOTE — Progress Notes (Signed)
CARDIAC REHAB PHASE I   PRE:  Rate/Rhythm: 91 SR    BP: sitting 135/74    SaO2: 97 RA  MODE:  Ambulation: 940 ft   POST:  Rate/Rhythm: 107 ST    BP: sitting 145/81     SaO2: 100 ra  Pt stood independently and took a few steps to RW. Walked hall independently with RW, slow and steady, talked entire walk. Some SOB but did not rest. No c/o, return to recliner. [phone]   Harriet Masson CES, ACSM 08/10/2021 10:57 AM

## 2021-08-11 ENCOUNTER — Other Ambulatory Visit (HOSPITAL_COMMUNITY): Payer: Self-pay

## 2021-08-11 MED ORDER — OXYCODONE HCL 5 MG PO TABS
5.0000 mg | ORAL_TABLET | Freq: Four times a day (QID) | ORAL | 0 refills | Status: DC | PRN
  Filled 2021-08-11: qty 28, 7d supply, fill #0

## 2021-08-11 MED ORDER — ATENOLOL 25 MG PO TABS
50.0000 mg | ORAL_TABLET | Freq: Every morning | ORAL | Status: DC
  Administered 2021-08-11: 50 mg via ORAL
  Filled 2021-08-11: qty 2

## 2021-08-11 MED ORDER — ROSUVASTATIN CALCIUM 20 MG PO TABS
20.0000 mg | ORAL_TABLET | Freq: Every day | ORAL | 1 refills | Status: DC
  Filled 2021-08-11: qty 30, 30d supply, fill #0

## 2021-08-11 MED ORDER — AMIODARONE HCL 200 MG PO TABS
200.0000 mg | ORAL_TABLET | Freq: Two times a day (BID) | ORAL | 1 refills | Status: DC
  Filled 2021-08-11: qty 60, 30d supply, fill #0

## 2021-08-11 MED ORDER — ATENOLOL 50 MG PO TABS
50.0000 mg | ORAL_TABLET | Freq: Every morning | ORAL | 1 refills | Status: DC
  Filled 2021-08-11: qty 30, 30d supply, fill #0

## 2021-08-11 MED ORDER — CLOPIDOGREL BISULFATE 75 MG PO TABS
75.0000 mg | ORAL_TABLET | Freq: Every day | ORAL | 1 refills | Status: DC
  Filled 2021-08-11: qty 30, 30d supply, fill #0

## 2021-08-11 MED ORDER — INDAPAMIDE 2.5 MG PO TABS
2.5000 mg | ORAL_TABLET | Freq: Every morning | ORAL | Status: DC
  Administered 2021-08-11: 2.5 mg via ORAL
  Filled 2021-08-11: qty 1

## 2021-08-11 MED ORDER — FE FUMARATE-B12-VIT C-FA-IFC PO CAPS
1.0000 | ORAL_CAPSULE | Freq: Two times a day (BID) | ORAL | 1 refills | Status: DC
  Filled 2021-08-11: qty 60, 30d supply, fill #0

## 2021-08-11 MED FILL — Mannitol IV Soln 20%: INTRAVENOUS | Qty: 500 | Status: AC

## 2021-08-11 MED FILL — Sodium Chloride IV Soln 0.9%: INTRAVENOUS | Qty: 2000 | Status: AC

## 2021-08-11 MED FILL — Sodium Bicarbonate IV Soln 8.4%: INTRAVENOUS | Qty: 50 | Status: AC

## 2021-08-11 MED FILL — Electrolyte-R (PH 7.4) Solution: INTRAVENOUS | Qty: 3000 | Status: AC

## 2021-08-11 MED FILL — Heparin Sodium (Porcine) Inj 1000 Unit/ML: INTRAMUSCULAR | Qty: 10 | Status: AC

## 2021-08-11 MED FILL — Lidocaine HCl (Cardiac) IV PF Soln 100 MG/5ML (2%): INTRAVENOUS | Qty: 5 | Status: AC

## 2021-08-11 NOTE — Progress Notes (Addendum)
[address]       [place] [address]             [phone]      6 Days Post-Op Procedure(s) (LRB): CORONARY ARTERY BYPASS GRAFTING (CABG) TIMES THREE, USING LEFT INTERNAL MAMMARTY ARTERY AND ENDOSCOPICALLY HARVESTED RIGHT GREATER SAPHENOUS VEIN (N/A) TRANSESOPHAGEAL ECHOCARDIOGRAM (TEE) (N/A) ENDOVEIN HARVEST OF GREATER SAPHENOUS VEIN (Right) Subjective: Conts to feel well  Objective: Vital signs in last 24 hours: Temp:  [98 F (36.7 C)-99.4 F (37.4 C)] 98 F (36.7 C) (02/08 2317) Pulse Rate:  [65-86] 77 (02/08 2317) Cardiac Rhythm: Normal sinus rhythm (02/08 1902) Resp:  [15-20] 20 (02/08 2317) BP: (107-151)/(63-133) 150/86 (02/08 2317) SpO2:  [97 %-98 %] 98 % (02/08 2317)  Hemodynamic parameters for last 24 hours:    Intake/Output from previous day: No intake/output data recorded. Intake/Output this shift: No intake/output data recorded.  General appearance: alert, cooperative, and no distress Heart: regular rate and rhythm Lungs: mildly dim left base Abdomen: benign Extremities: no edema Wound: incis healing well, some right thigh echymosis   Lab Results: Recent Labs    08/09/21 0053  WBC 12.8*  HGB 8.3*  HCT 25.5*  PLT 194   BMET:  Recent Labs    08/09/21 0053  NA 135  K 4.5  CL 101  CO2 26  GLUCOSE 171*  BUN 22  CREATININE 1.08*  CALCIUM 8.6*    PT/INR: No results for input(s): LABPROT, INR in the last 72 hours. ABG    Component Value Date/Time   PHART 7.392 08/05/2021 2100   HCO3 23.3 08/05/2021 2100   TCO2 24 08/05/2021 2100   ACIDBASEDEF 2.0 08/05/2021 2100   O2SAT 99.0 08/05/2021 2100   CBG (last 3)  Recent Labs    08/09/21 1603 08/09/21 2038 08/10/21 1150  GLUCAP 110* 127* 130*    Meds Scheduled Meds:  amiodarone  200 mg Oral BID   aspirin EC  81 mg Oral Daily   bisacodyl  10 mg Oral Daily   Or   bisacodyl  10 mg Rectal Daily   clopidogrel  75 mg Oral Daily   docusate sodium  200 mg Oral  Daily   enoxaparin (LOVENOX) injection  30 mg Subcutaneous QHS   ferrous fumarate-b12-vitamic C-folic acid  1 capsule Oral BID PC   hydrocortisone cream   Topical BID   mouth rinse  15 mL Mouth Rinse BID   metoprolol tartrate  25 mg Oral BID   pantoprazole  40 mg Oral Daily   rosuvastatin  20 mg Oral Daily   Continuous Infusions:  sodium chloride 250 mL (08/06/21 0503)   lactated ringers Stopped (08/06/21 0955)   promethazine (PHENERGAN) injection (IM or IVPB) Stopped (08/06/21 1518)   PRN Meds:.magnesium hydroxide, metoprolol tartrate, morphine injection, ondansetron (ZOFRAN) IV, oxyCODONE, promethazine (PHENERGAN) injection (IM or IVPB), traMADol  Xrays No results found.  Assessment/Plan: S/P Procedure(s) (LRB): CORONARY ARTERY BYPASS GRAFTING (CABG) TIMES THREE, USING LEFT INTERNAL MAMMARTY ARTERY AND ENDOSCOPICALLY HARVESTED RIGHT GREATER SAPHENOUS VEIN (N/A) TRANSESOPHAGEAL ECHOCARDIOGRAM (TEE) (N/A) ENDOVEIN HARVEST OF GREATER SAPHENOUS VEIN (Right)  POD#6 1 Tmax 99.4, VSS s BP 100's-150's, will resume lozol and change metoprolol to 50 mg atenolol( she was on 100 at home 2 sats good on RA 3 in sinus rhythm, cont po amio 4 no labs or xrays 5 stable for d/c 6 reviewed instructions     LOS: 10 days    Rowe Clack PA-C Pager [phone]  [phone] 08/11/2021    Chart reviewed, patient examined, agree with above. She looks good. Plan home today.

## 2021-08-11 NOTE — Plan of Care (Signed)
°  Problem: Education: Goal: Knowledge of General Education information will improve Description: Including pain rating scale, medication(s)/side effects and non-pharmacologic comfort measures Outcome: Adequate for Discharge   Problem: Health Behavior/Discharge Planning: Goal: Ability to manage health-related needs will improve Outcome: Adequate for Discharge   Problem: Clinical Measurements: Goal: Ability to maintain clinical measurements within normal limits will improve Outcome: Adequate for Discharge Goal: Will remain free from infection Outcome: Adequate for Discharge Goal: Diagnostic test results will improve Outcome: Adequate for Discharge Goal: Respiratory complications will improve Outcome: Adequate for Discharge Goal: Cardiovascular complication will be avoided Outcome: Adequate for Discharge   Problem: Nutrition: Goal: Adequate nutrition will be maintained Outcome: Adequate for Discharge   Problem: Coping: Goal: Level of anxiety will decrease Outcome: Adequate for Discharge   Problem: Elimination: Goal: Will not experience complications related to bowel motility Outcome: Adequate for Discharge Goal: Will not experience complications related to urinary retention Outcome: Adequate for Discharge   Problem: Pain Managment: Goal: General experience of comfort will improve Outcome: Adequate for Discharge   Problem: Safety: Goal: Ability to remain free from injury will improve Outcome: Adequate for Discharge   Problem: Skin Integrity: Goal: Risk for impaired skin integrity will decrease Outcome: Adequate for Discharge   Problem: Education: Goal: Understanding of cardiac disease, CV risk reduction, and recovery process will improve Outcome: Adequate for Discharge Goal: Individualized Educational Video(s) Outcome: Adequate for Discharge   Problem: Activity: Goal: Ability to tolerate increased activity will improve Outcome: Adequate for Discharge   Problem:  Cardiac: Goal: Ability to achieve and maintain adequate cardiovascular perfusion will improve Outcome: Adequate for Discharge   Problem: Health Behavior/Discharge Planning: Goal: Ability to safely manage health-related needs after discharge will improve Outcome: Adequate for Discharge   Problem: Education: Goal: Understanding of CV disease, CV risk reduction, and recovery process will improve Outcome: Adequate for Discharge Goal: Individualized Educational Video(s) Outcome: Adequate for Discharge   Problem: Activity: Goal: Ability to return to baseline activity level will improve Outcome: Adequate for Discharge   Problem: Cardiovascular: Goal: Ability to achieve and maintain adequate cardiovascular perfusion will improve Outcome: Adequate for Discharge   Problem: Health Behavior/Discharge Planning: Goal: Ability to safely manage health-related needs after discharge will improve Outcome: Adequate for Discharge   Problem: Education: Goal: Will demonstrate proper wound care and an understanding of methods to prevent future damage Outcome: Adequate for Discharge Goal: Knowledge of disease or condition will improve Outcome: Adequate for Discharge Goal: Knowledge of the prescribed therapeutic regimen will improve Outcome: Adequate for Discharge Goal: Individualized Educational Video(s) Outcome: Adequate for Discharge   Problem: Activity: Goal: Risk for activity intolerance will decrease Outcome: Adequate for Discharge   Problem: Cardiac: Goal: Will achieve and/or maintain hemodynamic stability Outcome: Adequate for Discharge   Problem: Clinical Measurements: Goal: Postoperative complications will be avoided or minimized Outcome: Adequate for Discharge   Problem: Respiratory: Goal: Respiratory status will improve Outcome: Adequate for Discharge   Problem: Skin Integrity: Goal: Wound healing without signs and symptoms of infection Outcome: Adequate for Discharge Goal:  Risk for impaired skin integrity will decrease Outcome: Adequate for Discharge   Problem: Urinary Elimination: Goal: Ability to achieve and maintain adequate renal perfusion and functioning will improve Outcome: Adequate for Discharge

## 2021-08-11 NOTE — Progress Notes (Signed)
Discussed with pt IS, sternal precautions, exercise, diet, and CRPII. Good reception. Will refer to [place] CRPII.  [id number] Ethelda Chick CES, ACSM 9:46 AM 08/11/2021

## 2021-08-11 NOTE — Progress Notes (Signed)
Patient given discharge instructions and stated understanding.  Patient is waiting on her daughter to bring her clothes before she leaves.  Patient is not appropriate for DC lounge.

## 2021-08-11 NOTE — Progress Notes (Signed)
Mobility Specialist Progress Note   08/11/21 1100  Mobility  Activity Ambulated with assistance in hallway  Level of Assistance Independent after set-up  Assistive Device  (HHA)  Distance Ambulated (ft) 260 ft  Activity Response Tolerated well  $Mobility charge 1 Mobility   Received pt standing in room having no complaints and agreeable. Pt having x2 LOB but corrected themselves quickly. For safety and used HHA for ambulation in hall. Returned back to chair w/ no further complaints, call bell in reach.  Frederico Hamman Mobility Specialist Phone Number [phone]

## 2021-08-11 NOTE — Care Management Important Message (Signed)
Important Message  Patient Details  Name: Tiffany Odonnell MRN: [id number] Date of Birth: 02/24/1951   Medicare Important Message Given:  Yes     Renie Ora 08/11/2021, 9:28 AM

## 2021-08-15 ENCOUNTER — Telehealth (HOSPITAL_COMMUNITY): Payer: Self-pay

## 2021-08-15 NOTE — Telephone Encounter (Signed)
Pt insurance is active and benefits verified through Medicare a/b Co-pay 0, DED $226/$226 met, out of pocket 0/0 met, co-insurance 20%. no pre-authorization required. Passport, 08/15/2021@10 :13am, REF# [id number]   2ndary insurance is active and benefits verified through [facility]. Co-pay 0, DED 0/0 met, out of pocket 0/0 met, co-insurance 0%. No pre-authorization required.   Will contact patient to see if she is interested in the Cardiac Rehab Program. If interested, patient will need to complete follow up appt. Once completed, patient will be contacted for scheduling upon review by the RN Navigator.

## 2021-08-15 NOTE — Telephone Encounter (Signed)
Called patient to see if she is interested in the Cardiac Rehab Program. Patient expressed interest. Explained scheduling process and went over insurance, patient verbalized understanding. Will contact patient for scheduling once f/u has been completed. 

## 2021-08-16 ENCOUNTER — Other Ambulatory Visit: Payer: Self-pay

## 2021-08-16 ENCOUNTER — Ambulatory Visit (INDEPENDENT_AMBULATORY_CARE_PROVIDER_SITE_OTHER): Payer: Self-pay

## 2021-08-16 DIAGNOSIS — Z4802 Encounter for removal of sutures: Secondary | ICD-10-CM

## 2021-08-16 NOTE — Progress Notes (Signed)
Patient arrived for nurse visit to remove sutures post- procedure CABG x3 08/05/21 with Dr. Cyndia Bent.  Three Sutures removed from mid-abdomen with no signs/ symptoms of infection noted.  Patient tolerated procedure well.  Patient instructed to keep the incision sites clean and dry.  Patient acknowledged instructions given.

## 2021-08-19 ENCOUNTER — Other Ambulatory Visit (HOSPITAL_COMMUNITY): Payer: Self-pay

## 2021-08-24 ENCOUNTER — Telehealth (HOSPITAL_COMMUNITY): Payer: Self-pay

## 2021-08-24 ENCOUNTER — Other Ambulatory Visit (HOSPITAL_COMMUNITY): Payer: Self-pay

## 2021-08-24 NOTE — Telephone Encounter (Signed)
Transitions of Care Pharmacy   Call attempted for a pharmacy transitions of care follow-up. HIPAA appropriate voicemail was left with call back information provided.   Call attempt #2. Will follow-up in 2-3 days.    

## 2021-08-25 ENCOUNTER — Telehealth (HOSPITAL_COMMUNITY): Payer: Self-pay

## 2021-08-25 NOTE — Telephone Encounter (Signed)
Transitions of Care Pharmacy   Call attempted for a pharmacy transitions of care follow-up. Unable to leave voicemail.  Call attempt #3. Will no longer attempt follow up for TOC pharmacy   

## 2021-08-26 ENCOUNTER — Ambulatory Visit: Payer: Medicare Other | Admitting: General Practice

## 2021-08-30 ENCOUNTER — Other Ambulatory Visit: Payer: Self-pay | Admitting: Surgery

## 2021-08-30 DIAGNOSIS — Z951 Presence of aortocoronary bypass graft: Secondary | ICD-10-CM

## 2021-08-31 ENCOUNTER — Ambulatory Visit (INDEPENDENT_AMBULATORY_CARE_PROVIDER_SITE_OTHER): Payer: Self-pay | Admitting: Surgery

## 2021-08-31 ENCOUNTER — Other Ambulatory Visit: Payer: Self-pay

## 2021-08-31 ENCOUNTER — Ambulatory Visit
Admission: RE | Admit: 2021-08-31 | Discharge: 2021-08-31 | Disposition: A | Discharge status: Home / Self Care | Payer: Medicare Other | Source: Ambulatory Visit | Attending: Surgery | Admitting: Surgery

## 2021-08-31 ENCOUNTER — Encounter: Payer: Self-pay | Admitting: Surgery

## 2021-08-31 VITALS — BP 147/82 | HR 60 | Resp 20 | Ht 61.0 in | Wt 154.0 lb

## 2021-08-31 DIAGNOSIS — Z951 Presence of aortocoronary bypass graft: Secondary | ICD-10-CM

## 2021-08-31 NOTE — Progress Notes (Signed)
? ? ?HPI: ?Patient returns for routine postoperative follow-up having undergone coronary bypass graft surgery x3 on 08/05/2021. ?The patient's early postoperative recovery while in the hospital was notable for an uncomplicated postoperative course.  She did develop atrial fibrillation and was started on amiodarone with conversion to sinus rhythm. ?Since hospital discharge the patient reports that she has been feeling well.  She is walking daily without chest pain or shortness of breath.  She has not noticed any tachypalpitations.  She does report having some nosebleeds on aspirin and Plavix. ? ? ?Current Outpatient Medications  ?Medication Sig Dispense Refill  ? acetaminophen (TYLENOL) 500 MG tablet Take 500 mg by mouth daily as needed for headache (pain).    ? amiodarone (PACERONE) 200 MG tablet Take 1 tablet (200 mg total) by mouth 2 (two) times daily. 60 tablet 1  ? Ascorbic Acid (VITAMIN C) 1000 MG tablet Take 2,000 mg by mouth daily as needed (immune support).    ? aspirin EC 81 MG tablet Take 1 tablet (81 mg total) by mouth daily. 14 tablet 1  ? atenolol (TENORMIN) 50 MG tablet Take 1 tablet (50 mg total) by mouth every morning. 30 tablet 1  ? betamethasone dipropionate 0.05 % lotion Apply 1 application topically 2 (two) times daily as needed (scalp rash).  2  ? Carboxymethylcellulose Sodium (THERATEARS OP) Place 1 drop into both eyes 2 (two) times daily as needed (dry eyes).    ? clopidogrel (PLAVIX) 75 MG tablet Take 1 tablet (75 mg total) by mouth daily. 30 tablet 1  ? docusate sodium (COLACE) 100 MG capsule Take 1 capsule (100 mg total) by mouth daily as needed for up to 30 doses. (Patient taking differently: Take 100 mg by mouth daily as needed (constipation).) 30 capsule 0  ? ECHINACEA PO Take 2 capsules by mouth daily as needed (immune system boost).    ? ferrous [id number] C-folic acid (TRINSICON / FOLTRIN) capsule Take 1 capsule by mouth 2 (two) times daily after a meal. 60 capsule 1  ?  hydrocortisone (ANUSOL-HC) 2.5 % rectal cream Place 1 application rectally 3 (three) times daily as needed for hemorrhoids.    ? indapamide (LOZOL) 2.5 MG tablet Take 2.5 mg by mouth every morning.    ? MAGNESIUM PO Take 1 tablet by mouth at bedtime as needed (relax nervew).    ? Multiple Vitamins-Minerals (ZINC PO) Take 1 tablet by mouth at bedtime as needed (to relax nerves).    ? oxyCODONE (OXY IR/ROXICODONE) 5 MG immediate release tablet Take 1 tablet (5 mg total) by mouth every 6 (six) hours as needed for severe pain. 28 tablet 0  ? Potassium Citrate 15 MEQ (1620 MG) TBCR Take 30 mEq by mouth every morning.    ? rosuvastatin (CRESTOR) 20 MG tablet Take 1 tablet (20 mg total) by mouth daily. 30 tablet 1  ? ?No current facility-administered medications for this visit.  ? ? ?Physical Exam: ?BP (!) 147/82 (BP Location: Right Arm, Patient Position: Sitting)   Pulse 60   Resp 20   Ht 5\' 1"  (1.549 m)   Wt 154 lb (69.9 kg)   SpO2 96% Comment: Ra  BMI 29.10 kg/m?  ?She looks well. ?Cardiac exam shows a regular rate and rhythm with normal heart sounds. ?Lungs are clear. ?The chest incision is healing well and the sternum is stable. ?Her leg incision is healing well and there is no peripheral edema. ? ?Diagnostic Tests: ? ?Narrative & Impression  ?CLINICAL DATA:  Status  post CABG ?  ?EXAM: ?CHEST - 2 VIEW ?  ?COMPARISON:  08/09/2021 ?  ?FINDINGS: ?3 ?  ?No focal consolidation. No pleural effusion or pneumothorax. Heart ?and mediastinal contours are unremarkable. Prior CABG. ?  ?No acute osseous abnormality. ?  ?IMPRESSION: ?No active cardiopulmonary disease. ?  ?  ?Electronically Signed ?  By: Kathreen Devoid M.D. ?  On: 08/31/2021 09:51  ? ? ?Impression: ? ?Overall she is progressing well following coronary bypass surgery.  I told her to decrease the amiodarone to 200 mg once daily.  Since she is having nosebleeds I told her to discontinue the Plavix and continue aspirin 81 mg daily.  I told her she could return to  driving a car at this time but should refrain from lifting anything heavier than 10 pounds for 3 months postoperatively.  I think she can begin cardiac rehab at this time. ? ?Plan: ? ?She has a follow-up appointment with cardiology on 09/19/2021.  They can decide on the appropriate time to discontinue the amiodarone.  She will return to see me if she has any problems with her incisions. ? ? ?Gaye Pollack, MD ?[facility] ?[phone] ? ?

## 2021-09-05 ENCOUNTER — Other Ambulatory Visit (HOSPITAL_COMMUNITY): Payer: Self-pay

## 2021-09-06 ENCOUNTER — Other Ambulatory Visit (HOSPITAL_COMMUNITY): Payer: Self-pay

## 2021-09-13 NOTE — Progress Notes (Signed)
error 

## 2021-09-16 NOTE — Progress Notes (Signed)
? ? ?Office Visit  ?  ?Patient Name: Tiffany Odonnell ?Date of Encounter: 09/19/2021 ? ?Primary Care Provider:  Beverley Fiedler, FNP ?Primary Cardiologist:  Pixie Casino, MD ?Primary Electrophysiologist: None ?Chief Complaint  ?  ?Tiffany Odonnell is a 71 y.o. female presents today for post CABG hospitalization follow-up ? ?History of Present Illness  ?  ?Tiffany Odonnell is a 71 y.o. female originally from [place] with PMH of HTN, HLD, fibromyalgia, DM II, Sjogren's.  Patient presented to the emergency room on 08/01/2021 with complaints of chest pain with sternal radiation.  Patient had noticed shortness of breath with exertion over the past few months.  She was walking on treadmill and developed sharp substernal pain.  Patient was transferred via EMS and given 4 baby aspirin and nitroglycerin. Blood pressure was 210/140.EKG with borderline ST segment elevations and troponins mildly elevated at 73-433.heparin drip was initiated and patient was admitted for additional ischemic work-up.  Echo completed 1/31 with LVEF of 60-65%, grade 1 DD, mild aortic valve calcification, no LVH. S/P cardiac catheterization completed 2/1 withProx LAD lesion is 75% stenosed,Prox LAD to Mid LAD lesion is 100% stenosed.  Right to left collaterals fill the mid to distal LAD.1st Diag lesion is 95% stenosed. CVTS consulted for CABG evaluation.  CABG x3 completed 2/3 with internal mammary and harvested right great saphenous vein.  Patient tolerated procedure well and did develop atrial fibrillation but converted to sinus rhythm after being on amiodarone.  She was discharged 2/9 in stable condition. ? ?Tiffany Odonnell completed postop visit on 3/1 and was doing well with no complications.  Incision looks well.  She is walking daily with no chest pain or shortness of breath she does however notice nosebleeds on aspirin and Plavix.  Plavix was discontinued and aspirin 81 mg continued.  Amiodarone was decreased to 200 mg once daily.    ? ?Since being discharged from the hospital Tiffany Odonnell reports that she is is doing well.  She is accompanied today by her grandson.  She currently denies chest pain, palpitations, dyspnea, PND, orthopnea, nausea, vomiting, dizziness, syncope, edema, weight gain, or early satiety.  She has been walking 1-2 blocks and is interested in cardiac rehab. She is taking all medication medications as directed and has no barriers for affordability.  She does have pain in her shoulders and mid neck that started after procedure.  She does have occasional dizziness when standing from sitting position.  She has sinus bradycardia today in the 50s with blood pressure of 118/78. ? ?Past Medical History  ?  ?Past Medical History:  ?Diagnosis Date  ? Anxiety   ? Blood in urine   ? Cervical disc disorder   ? bulging disc  ? Diabetes mellitus without complication ([place])   ? Dizziness   ? Falls   ? Fibromyalgia   ? H/O Salmonella gastroenteritis   ? High cholesterol   ? History of frequent urinary tract infections   ? History of hiatal hernia   ? History of vertigo   ? Hypertension   ? Imbalance   ? Kidney stones   ? Lichen planopilaris   ? Lumbar herniated disc   ? L5-L6  ? Numbness and tingling   ? hands and feet bilat   ? Oscillopsia   ? Peripheral neuropathy   ? Septicemia ([place])   ? 2013  ? Sinusitis   ? Sjogren's disease ([place])   ? Tinnitus   ? Wears glasses   ? ?Past Surgical  History:  ?Procedure Laterality Date  ? ABDOMINAL HYSTERECTOMY    ? 2014  ? CORONARY ARTERY BYPASS GRAFT N/A 08/05/2021  ? Procedure: CORONARY ARTERY BYPASS GRAFTING (CABG) TIMES THREE, USING LEFT INTERNAL MAMMARTY ARTERY AND ENDOSCOPICALLY HARVESTED RIGHT GREATER SAPHENOUS VEIN;  Surgeon: Gaye Pollack, MD;  Location: [facility];  Service: Open Heart Surgery;  Laterality: N/A;  ? ENDOVEIN HARVEST OF GREATER SAPHENOUS VEIN Right 08/05/2021  ? Procedure: ENDOVEIN HARVEST OF GREATER SAPHENOUS VEIN;  Surgeon: Gaye Pollack, MD;  Location: [facility];  Service: Open Heart  Surgery;  Laterality: Right;  ? IR URETERAL STENT RIGHT NEW ACCESS W/O SEP NEPHROSTOMY CATH  07/15/2021  ? KNEE ARTHROSCOPY WITH MEDIAL MENISECTOMY Left 10/29/2019  ? Procedure: KNEE ARTHROSCOPY WITH MEDIAL AND LATERAL MENISECTOMY WITH DEBRIDEMENT;  Surgeon: Susa Day, MD;  Location: [facility];  Service: Orthopedics;  Laterality: Left;  60 mins  ? LEFT HEART CATH AND CORONARY ANGIOGRAPHY N/A 08/02/2021  ? Procedure: LEFT HEART CATH AND CORONARY ANGIOGRAPHY;  Surgeon: Jettie Booze, MD;  Location: [facility];  Service: Cardiovascular;  Laterality: N/A;  ? NASAL SINUS SURGERY    ? NEPHROLITHOTOMY Right 05/31/2015  ? Procedure: RIGHT PERCUTANEOUS NEPHROLITHOTOMY  ;  Surgeon: Kathie Rhodes, MD;  Location: [facility];  Service: Urology;  Laterality: Right;  ? NEPHROLITHOTOMY Right 07/15/2021  ? Procedure: NEPHROLITHOTOMY PERCUTANEOUS;  Surgeon: Janith Lima, MD;  Location: [facility];  Service: Urology;  Laterality: Right;  ? prolapsed bladder and rectocele surgery     ? TEE WITHOUT CARDIOVERSION N/A 08/05/2021  ? Procedure: TRANSESOPHAGEAL ECHOCARDIOGRAM (TEE);  Surgeon: Gaye Pollack, MD;  Location: [facility];  Service: Open Heart Surgery;  Laterality: N/A;  ? TUBAL LIGATION    ? 1983  ? URETERAL STENT PLACEMENT    ? times 2 secondary to kidney stone   ? ? ?Allergies ? ?Allergies  ?Allergen Reactions  ? Amoxicillin-Pot Clavulanate Nausea And Vomiting  ? Ciprofloxacin Hives  ? Sulfa Antibiotics Other (See Comments)  ?  Trembling, paralyzing pain, red eyes, skin crawling  ? Latex Itching  ? Macrolides And Ketolides Diarrhea and Nausea And Vomiting  ? Tetracyclines & Related Other (See Comments) and Tinitus  ?  Vertigo  ? ? ?Home Medications  ?  ?Current Outpatient Medications  ?Medication Sig Dispense Refill  ? acetaminophen (TYLENOL) 500 MG tablet Take 500 mg by mouth daily as needed for headache (pain).    ? amiodarone (PACERONE) 200 MG tablet Take 200 mg by mouth daily.    ? Ascorbic Acid (VITAMIN C) 1000 MG tablet  Take 2,000 mg by mouth daily as needed (immune support).    ? aspirin EC 81 MG tablet Take 1 tablet (81 mg total) by mouth daily. 14 tablet 1  ? atenolol (TENORMIN) 50 MG tablet Take 1 tablet (50 mg total) by mouth every morning. 30 tablet 1  ? betamethasone dipropionate 0.05 % lotion Apply 1 application topically 2 (two) times daily as needed (scalp rash).  2  ? Carboxymethylcellulose Sodium (THERATEARS OP) Place 1 drop into both eyes 2 (two) times daily as needed (dry eyes).    ? docusate sodium (COLACE) 100 MG capsule Take 1 capsule (100 mg total) by mouth daily as needed for up to 30 doses. (Patient taking differently: Take 100 mg by mouth daily as needed (constipation).) 30 capsule 0  ? ECHINACEA PO Take 2 capsules by mouth daily as needed (immune system boost).    ? ferrous Q000111Q C-folic acid (TRINSICON /  FOLTRIN) capsule Take 1 capsule by mouth 2 (two) times daily after a meal. 60 capsule 1  ? hydrocortisone (ANUSOL-HC) 2.5 % rectal cream Place 1 application rectally 3 (three) times daily as needed for hemorrhoids.    ? indapamide (LOZOL) 2.5 MG tablet Take 2.5 mg by mouth every morning.    ? MAGNESIUM PO Take 1 tablet by mouth at bedtime as needed (relax nervew).    ? Multiple Vitamins-Minerals (ZINC PO) Take 1 tablet by mouth at bedtime as needed (to relax nerves).    ? oxyCODONE (OXY IR/ROXICODONE) 5 MG immediate release tablet Take 1 tablet (5 mg total) by mouth every 6 (six) hours as needed for severe pain. 28 tablet 0  ? Potassium Citrate 15 MEQ (1620 MG) TBCR Take 30 mEq by mouth every morning.    ? rosuvastatin (CRESTOR) 20 MG tablet Take 1 tablet (20 mg total) by mouth daily. 30 tablet 1  ? ?No current facility-administered medications for this visit.  ?  ? ?Review of Systems  ?Please see the history of present illness.    ?(+) Midsternal pain ?(+) Shoulder neck pain ? ?All other systems reviewed and are otherwise negative except as noted above. ? ?Physical Exam  ?  ?Wt Readings from  Last 3 Encounters:  ?09/19/21 155 lb 6.4 oz (70.5 kg)  ?08/31/21 154 lb (69.9 kg)  ?08/09/21 155 lb 3.2 oz (70.4 kg)  ? ?VS: ?Vitals:  ? 09/19/21 0808  ?BP: 118/78  ?Pulse: (!) 50  ?,Body mass index is 29.36

## 2021-09-19 ENCOUNTER — Other Ambulatory Visit: Payer: Self-pay

## 2021-09-19 ENCOUNTER — Encounter (HOSPITAL_BASED_OUTPATIENT_CLINIC_OR_DEPARTMENT_OTHER): Payer: Self-pay | Admitting: General Practice

## 2021-09-19 ENCOUNTER — Ambulatory Visit (INDEPENDENT_AMBULATORY_CARE_PROVIDER_SITE_OTHER): Payer: Medicare Other | Admitting: Nurse Practitioner

## 2021-09-19 VITALS — BP 118/78 | HR 50 | Ht 61.0 in | Wt 155.4 lb

## 2021-09-19 DIAGNOSIS — I214 Non-ST elevation (NSTEMI) myocardial infarction: Secondary | ICD-10-CM | POA: Diagnosis not present

## 2021-09-19 DIAGNOSIS — I10 Essential (primary) hypertension: Secondary | ICD-10-CM

## 2021-09-19 DIAGNOSIS — E785 Hyperlipidemia, unspecified: Secondary | ICD-10-CM | POA: Diagnosis not present

## 2021-09-19 DIAGNOSIS — I25708 Atherosclerosis of coronary artery bypass graft(s), unspecified, with other forms of angina pectoris: Secondary | ICD-10-CM

## 2021-09-19 NOTE — Patient Instructions (Signed)
Medication Instructions:  ?STOP CLOPIDOGREL (PLAVIX)  ? ?CONTINUE ASPIRIN  ? ?DECREASE AMIODARONE TO 200 MG DAILY  ? ?*If you need a refill on your cardiac medications before your next appointment, please call your pharmacy* ? ?Lab Work: ?BMET/CBC TODAY  ? ?If you have labs (blood work) drawn today and your tests are completely normal, you will receive your results only by: ?MyChart Message (if you have MyChart) OR ?A paper copy in the mail ?If you have any lab test that is abnormal or we need to change your treatment, we will call you to review the results. ? ?Testing/Procedures: ?NONE ? ?Follow-Up: ?At [facility], you and your health needs are our priority.  As part of our continuing mission to provide you with exceptional heart care, we have created designated Provider Care Teams.  These Care Teams include your primary Cardiologist (physician) and Advanced Practice Providers (APPs -  Physician Assistants and Nurse Practitioners) who all work together to provide you with the care you need, when you need it. ? ?We recommend signing up for the patient portal called "MyChart".  Sign up information is provided on this After Visit Summary.  MyChart is used to connect with patients for Virtual Visits (Telemedicine).  Patients are able to view lab/test results, encounter notes, upcoming appointments, etc.  Non-urgent messages can be sent to your provider as well.   ?To learn more about what you can do with MyChart, go to [email or web address].   ? ?Your next appointment:   ?3 month(s) ? ?The format for your next appointment:   ?In Person ? ?Provider:   ?Chrystie Nose, MD { ? ?Other Instructions ? ?You have been referred to CARDIAC REHAB. IF YOU DO NOT HEAR FROM THEM IN 2 WEEKS CALL THE OFFICE TO FOLLOW UP  ?

## 2021-09-20 LAB — CBC WITH DIFFERENTIAL/PLATELET
Basophils Absolute: 0.1 10*3/uL (ref 0.0–0.2)
Basos: 1 %
EOS (ABSOLUTE): 0.2 10*3/uL (ref 0.0–0.4)
Eos: 3 %
Hematocrit: 41.4 % (ref 34.0–46.6)
Hemoglobin: 13.6 g/dL (ref 11.1–15.9)
Immature Grans (Abs): 0 10*3/uL (ref 0.0–0.1)
Immature Granulocytes: 0 %
Lymphocytes Absolute: 1.5 10*3/uL (ref 0.7–3.1)
Lymphs: 26 %
MCH: 31.9 pg (ref 26.6–33.0)
MCHC: 32.9 g/dL (ref 31.5–35.7)
MCV: 97 fL (ref 79–97)
Monocytes Absolute: 0.9 10*3/uL (ref 0.1–0.9)
Monocytes: 15 %
Neutrophils Absolute: 3.2 10*3/uL (ref 1.4–7.0)
Neutrophils: 55 %
Platelets: 245 10*3/uL (ref 150–450)
RBC: 4.27 x10E6/uL (ref 3.77–5.28)
RDW: 12.3 % (ref 11.7–15.4)
WBC: 5.7 10*3/uL (ref 3.4–10.8)

## 2021-09-20 LAB — BASIC METABOLIC PANEL
BUN/Creatinine Ratio: 18 (ref 12–28)
BUN: 19 mg/dL (ref 8–27)
CO2: 23 mmol/L (ref 20–29)
Calcium: 9.6 mg/dL (ref 8.7–10.3)
Chloride: 100 mmol/L (ref 96–106)
Creatinine, Ser: 1.07 mg/dL — ABNORMAL HIGH (ref 0.57–1.00)
Glucose: 114 mg/dL — ABNORMAL HIGH (ref 70–99)
Potassium: 4.9 mmol/L (ref 3.5–5.2)
Sodium: 138 mmol/L (ref 134–144)
eGFR: 56 mL/min/{1.73_m2} — ABNORMAL LOW (ref >59)

## 2021-09-21 ENCOUNTER — Telehealth (HOSPITAL_BASED_OUTPATIENT_CLINIC_OR_DEPARTMENT_OTHER): Payer: Self-pay

## 2021-09-21 NOTE — Telephone Encounter (Addendum)
Called patient and received busy signal. Will reattempt.  ? ? ?----- Message from Elizabeth Palau, NP sent at 09/21/2021  8:08 AM EDT ----- ?Stable kidney function. Normal electrolytes. Good result!  ? ?Robin Searing, NP ?

## 2021-09-23 ENCOUNTER — Ambulatory Visit: Payer: Medicare Other | Admitting: Internal Medicine

## 2021-10-01 ENCOUNTER — Other Ambulatory Visit: Payer: Self-pay | Admitting: Surgical

## 2021-10-24 ENCOUNTER — Telehealth: Payer: Self-pay | Admitting: Internal Medicine

## 2021-10-24 ENCOUNTER — Other Ambulatory Visit: Payer: Self-pay | Admitting: Endocrinology

## 2021-10-24 DIAGNOSIS — Z1231 Encounter for screening mammogram for malignant neoplasm of breast: Secondary | ICD-10-CM

## 2021-10-24 NOTE — Telephone Encounter (Signed)
Would discuss with Luther Parody on Thursday- ? ?Dr Rexene Edison ?

## 2021-10-24 NOTE — Telephone Encounter (Signed)
STAT if HR is under 50 or over 120 ?(normal HR is 60-100 beats per minute) ? ?What is your heart rate? 49 ? ?Do you have a log of your heart rate readings (document readings)? 42,49 ? ?Do you have any other symptoms? Patient just feels faint and unsteady  ?

## 2021-10-24 NOTE — Telephone Encounter (Signed)
Spoke with pt's daughter, Maryann Conners (ok per DPR), who is currently with pt regarding low heart rates. Pt was feeling fine today until she suddenly felt faint, foggy and off balance, at this time daughter took pt's blood pressure 127/77 with heart rate 42bpm. Daughter waiting a few minutes and took blood pressure again 130/78 with heart rate 49bpm. Daughter then had pt put her feet up and re-took the blood pressure 143/77 with heart rate 60bpm. Pt is feeling a bit better now. Per daughter, pt is taking all of her medication as prescribed. Office visit scheduled for pt to see Garnetta Buddy, NP on Thursday. Will forward to primary cardiologist.  ?

## 2021-10-25 ENCOUNTER — Telehealth (HOSPITAL_BASED_OUTPATIENT_CLINIC_OR_DEPARTMENT_OTHER): Payer: Self-pay

## 2021-10-25 NOTE — Progress Notes (Signed)
? ?Office Visit  ?  ?Patient Name: Tiffany Odonnell ?Date of Encounter: 10/27/2021 ? ?PCP:  Beverley Fiedler, FNP ?  ?[place]  ?Cardiologist:  Pixie Casino, MD  ?Advanced Practice Provider:  No care team member to display ?Electrophysiologist:  None  ?   ? ?Chief Complaint  ?  ?[name] is a 71 y.o. female with a hx of CAD s/p CABG, HTN, HLD, fibromyalgia, DMII, Sjogren's presents today for bradycardia  ? ?Past Medical History  ?  ?Past Medical History:  ?Diagnosis Date  ? Anxiety   ? Blood in urine   ? Cervical disc disorder   ? bulging disc  ? Diabetes mellitus without complication ([place])   ? Dizziness   ? Falls   ? Fibromyalgia   ? H/O Salmonella gastroenteritis   ? High cholesterol   ? History of frequent urinary tract infections   ? History of hiatal hernia   ? History of vertigo   ? Hypertension   ? Imbalance   ? Kidney stones   ? Lichen planopilaris   ? Lumbar herniated disc   ? L5-L6  ? Numbness and tingling   ? hands and feet bilat   ? Oscillopsia   ? Peripheral neuropathy   ? Septicemia ([place])   ? 2013  ? Sinusitis   ? Sjogren's disease ([place])   ? Tinnitus   ? Wears glasses   ? ?Past Surgical History:  ?Procedure Laterality Date  ? ABDOMINAL HYSTERECTOMY    ? 2014  ? CORONARY ARTERY BYPASS GRAFT N/A 08/05/2021  ? Procedure: CORONARY ARTERY BYPASS GRAFTING (CABG) TIMES THREE, USING LEFT INTERNAL MAMMARTY ARTERY AND ENDOSCOPICALLY HARVESTED RIGHT GREATER SAPHENOUS VEIN;  Surgeon: Gaye Pollack, MD;  Location: [facility];  Service: Open Heart Surgery;  Laterality: N/A;  ? ENDOVEIN HARVEST OF GREATER SAPHENOUS VEIN Right 08/05/2021  ? Procedure: ENDOVEIN HARVEST OF GREATER SAPHENOUS VEIN;  Surgeon: Gaye Pollack, MD;  Location: [facility];  Service: Open Heart Surgery;  Laterality: Right;  ? IR URETERAL STENT RIGHT NEW ACCESS W/O SEP NEPHROSTOMY CATH  07/15/2021  ? KNEE ARTHROSCOPY WITH MEDIAL MENISECTOMY Left 10/29/2019  ? Procedure: KNEE ARTHROSCOPY WITH MEDIAL AND LATERAL MENISECTOMY  WITH DEBRIDEMENT;  Surgeon: Susa Day, MD;  Location: [facility];  Service: Orthopedics;  Laterality: Left;  60 mins  ? LEFT HEART CATH AND CORONARY ANGIOGRAPHY N/A 08/02/2021  ? Procedure: LEFT HEART CATH AND CORONARY ANGIOGRAPHY;  Surgeon: Jettie Booze, MD;  Location: [facility];  Service: Cardiovascular;  Laterality: N/A;  ? NASAL SINUS SURGERY    ? NEPHROLITHOTOMY Right 05/31/2015  ? Procedure: RIGHT PERCUTANEOUS NEPHROLITHOTOMY  ;  Surgeon: Kathie Rhodes, MD;  Location: [facility];  Service: Urology;  Laterality: Right;  ? NEPHROLITHOTOMY Right 07/15/2021  ? Procedure: NEPHROLITHOTOMY PERCUTANEOUS;  Surgeon: Janith Lima, MD;  Location: [facility];  Service: Urology;  Laterality: Right;  ? prolapsed bladder and rectocele surgery     ? TEE WITHOUT CARDIOVERSION N/A 08/05/2021  ? Procedure: TRANSESOPHAGEAL ECHOCARDIOGRAM (TEE);  Surgeon: Gaye Pollack, MD;  Location: [facility];  Service: Open Heart Surgery;  Laterality: N/A;  ? TUBAL LIGATION    ? 1983  ? URETERAL STENT PLACEMENT    ? times 2 secondary to kidney stone   ? ? ?Allergies ? ?Allergies  ?Allergen Reactions  ? Amoxicillin-Pot Clavulanate Nausea And Vomiting  ? Ciprofloxacin Hives  ? Sulfa Antibiotics Other (See Comments)  ?  Trembling, paralyzing pain, red  eyes, skin crawling  ? Latex Itching  ? Macrolides And Ketolides Diarrhea and Nausea And Vomiting  ? Tetracyclines & Related Other (See Comments) and Tinitus  ?  Vertigo  ? ? ?History of Present Illness  ?  ?Tiffany Odonnell is a 71 y.o. female with a hx of CAD s/p CABG, HTN, HLD, fibromyalgia, DMII, Sjogren's last seen 09/19/21. ? ?Presented to the ED 08/01/21 with chest pain. HS toponin 73 ? 433. Echo 08/02/21 LVEF 60-65%, gr1DD. Cardiac catheterization 08/03/21 with pLAD 75% stenosed, prox-mid LAD 100% stenosis, right to left collaterals, 1st diagonal 95% stenosed. On 08/05/21 underwent CABG x3. She developed postoperative atrial fibrillation and converted with Amiodarone.  ? ?Seen 08/31/21 by  cardiothoracic surgeon doing well. Plavix discontinued due to nosebleed and Aspirin continued. Amiodarone decreased to 200mg  daily.  ? ?Seen in cardiology clinic 09/19/21 doing well, walking 1-2 blocks without dyspnea. Still with some shoulder and neck pain. No changes were made at that time. ? ?Contacted the office 10/24/21 noting heart rate 49 bpm with feeling faint, lightheaded. Bp at that time 130/78. A few minutes later BP 143/77 and HR 60 bpm.  ? ?She presents today for follow up with her daughter. Pleasant lady who is originally from [place]. Tells me two days ago symptoms started. She felt very jittery. That morning she had gotten up to go for a walk. During her walk felt she wasn't focusing well. Tells me she felt "off" described as nervous, lightheaded. She has not had dizziness or sensation of room spinning. She feels more lightheaded as if she needs to sit down. Tells me her balance feels affected - it was not good prior to surgery but feels worse since surgery. Notes her tinnitus has also increased since her surgery. ? ?EKGs/Labs/Other Studies Reviewed:  ? ?The following studies were reviewed today: ? ?LHC 08/02/21 ?  Prox LAD lesion is 75% stenosed. ?  Prox LAD to Mid LAD lesion is 100% stenosed.  Right to left collaterals fill the mid to distal LAD. ?  1st Diag lesion is 95% stenosed. ?  Prox RCA lesion is 40% stenosed. ?  Mid RCA lesion is 25% stenosed. ?  The left ventricular systolic function is normal. ?  LV end diastolic pressure is normal. ?  The left ventricular ejection fraction is 55-65% by visual estimate. ?  There is no aortic valve stenosis. ?  ?Complex, calcified proximal to mid LAD chronic total occlusion, at the origin of large diagonal.   ?  ?Plan for cardiac surgery consult ? ?Doppler 08/03/21 ? ?Summary:  ?Right Carotid: The extracranial vessels were near-normal with only minimal  ?wall  ?              thickening or plaque.  ? ?Left Carotid: The extracranial vessels were  near-normal with only minimal  ?wall  ?             thickening or plaque.  ?Vertebrals:  Bilateral vertebral arteries demonstrate antegrade flow.  ?Subclavians: Normal flow hemodynamics were seen in bilateral subclavian  ?             arteries.  ? ?Echo 08/02/21 ? 1. Left ventricular ejection fraction, by estimation, is 60 to 65%. The  ?left ventricle has normal function. The left ventricle has no regional  ?wall motion abnormalities. Left ventricular diastolic parameters are  ?consistent with Grade I diastolic  ?dysfunction (impaired relaxation).  ? 2. Right ventricular systolic function is normal. The right ventricular  ?size is  normal. There is normal pulmonary artery systolic pressure.  ? 3. The mitral valve is normal in structure. Mild mitral valve  ?regurgitation. No evidence of mitral stenosis.  ? 4. The aortic valve is tricuspid. There is mild calcification of the  ?aortic valve. Aortic valve regurgitation is not visualized. Aortic valve  ?sclerosis is present, with no evidence of aortic valve stenosis.  ? 5. The inferior vena cava is normal in size with greater than 50%  ?respiratory variability, suggesting right atrial pressure of 3 mmHg.  ? ?EKG:  EKG is ordered today.  The ekg ordered today demonstrates SB 50 bpm with no acute ST/T wave changes. ? ?Recent Labs: ?08/01/2021: TSH 0.871 ?08/02/2021: ALT 21 ?08/06/2021: Magnesium 2.3 ?09/19/2021: BUN 19; Creatinine, Ser 1.07; Hemoglobin 13.6; Platelets 245; Potassium 4.9; Sodium 138  ?Recent Lipid Panel ?   ?Component Value Date/Time  ? CHOL 222 (H) 08/02/2021 0400  ? TRIG 98 08/02/2021 0400  ? HDL 56 08/02/2021 0400  ? CHOLHDL 4.0 08/02/2021 0400  ? VLDL 20 08/02/2021 0400  ? LDLCALC 146 (H) 08/02/2021 0400  ? ? ? ?Home Medications  ? ?Current Meds  ?Medication Sig  ? acetaminophen (TYLENOL) 500 MG tablet Take 500 mg by mouth daily as needed for headache (pain).  ? Ascorbic Acid (VITAMIN C) 1000 MG tablet Take 2,000 mg by mouth daily as needed (immune support).   ? aspirin EC 81 MG tablet Take 1 tablet (81 mg total) by mouth daily.  ? atenolol (TENORMIN) 25 MG tablet Take 1 tablet (25 mg total) by mouth daily.  ? betamethasone dipropionate 0.05 % lotion Apply 1 application top

## 2021-10-25 NOTE — Telephone Encounter (Signed)
RN was asked to call patients daughter. Called no answer, unable to leave VM. Patient was triaged on 4/24 and was told to follow up on 4/24 with Gillian Shields to address concerns per Dr. Rennis Golden, will also send a [facility].  ? ? ? ? ? ? ?Copied from note on 4/24-  ? ?"Spoke with pt's daughter, Tiffany Odonnell (ok per DPR), who is currently with pt regarding low heart rates. Pt was feeling fine today until she suddenly felt faint, foggy and off balance, at this time daughter took pt's blood pressure 127/77 with heart rate 42bpm. Daughter waiting a few minutes and took blood pressure again 130/78 with heart rate 49bpm. Daughter then had pt put her feet up and re-took the blood pressure 143/77 with heart rate 60bpm. Pt is feeling a bit better now. Per daughter, pt is taking all of her medication as prescribed. Office visit scheduled for pt to see Garnetta Buddy, NP on Thursday. Will forward to primary cardiologist. " Per Dr. Rennis Golden follow up with Gillian Shields, NP on Thursday 4/27 ?

## 2021-10-27 ENCOUNTER — Ambulatory Visit (INDEPENDENT_AMBULATORY_CARE_PROVIDER_SITE_OTHER): Payer: Medicare Other

## 2021-10-27 ENCOUNTER — Ambulatory Visit (INDEPENDENT_AMBULATORY_CARE_PROVIDER_SITE_OTHER): Payer: Medicare Other | Admitting: Family

## 2021-10-27 ENCOUNTER — Encounter (HOSPITAL_BASED_OUTPATIENT_CLINIC_OR_DEPARTMENT_OTHER): Payer: Self-pay | Admitting: Family

## 2021-10-27 VITALS — BP 126/76 | HR 50 | Ht 61.0 in | Wt 153.0 lb

## 2021-10-27 DIAGNOSIS — I10 Essential (primary) hypertension: Secondary | ICD-10-CM | POA: Diagnosis not present

## 2021-10-27 DIAGNOSIS — I4891 Unspecified atrial fibrillation: Secondary | ICD-10-CM

## 2021-10-27 DIAGNOSIS — I9789 Other postprocedural complications and disorders of the circulatory system, not elsewhere classified: Secondary | ICD-10-CM

## 2021-10-27 DIAGNOSIS — R001 Bradycardia, unspecified: Secondary | ICD-10-CM

## 2021-10-27 DIAGNOSIS — Z951 Presence of aortocoronary bypass graft: Secondary | ICD-10-CM

## 2021-10-27 DIAGNOSIS — R55 Syncope and collapse: Secondary | ICD-10-CM

## 2021-10-27 DIAGNOSIS — E785 Hyperlipidemia, unspecified: Secondary | ICD-10-CM | POA: Diagnosis not present

## 2021-10-27 DIAGNOSIS — I25708 Atherosclerosis of coronary artery bypass graft(s), unspecified, with other forms of angina pectoris: Secondary | ICD-10-CM

## 2021-10-27 MED ORDER — ATENOLOL 25 MG PO TABS: 25.0000 mg | ORAL_TABLET | Freq: Every day | ORAL | 3 refills | Status: DC

## 2021-10-27 NOTE — Patient Instructions (Addendum)
Medication Instructions:  ?Your physician has recommended you make the following change in your medication:  ? ?STOP Amiodarone ? ?REDUCE Atenolol to 25mg  daily  ? ?*If you need a refill on your cardiac medications before your next appointment, please call your pharmacy* ? ? ?Lab Work: ?Your physician recommends that you return for lab work today: BMP, magnesium ? ?If you have labs (blood work) drawn today and your tests are completely normal, you will receive your results only by: ?MyChart Message (if you have MyChart) OR ?A paper copy in the mail ?If you have any lab test that is abnormal or we need to change your treatment, we will call you to review the results. ? ? ?Testing/Procedures: ?Your EKG today shows sinus bradycardia which is a slow but regular heart rhythm. ? ?Your physician has recommended that you wear a Zio AT Live monitor.  ? ?This monitor is a medical device that records the heart?s electrical activity. Doctors most often use these monitors to diagnose arrhythmias. Arrhythmias are problems with the speed or rhythm of the heartbeat. The monitor is a small device applied to your chest. You can wear one while you do your normal daily activities. While wearing this monitor if you have any symptoms to push the button and record what you felt. Once you have worn this monitor for the period of time provider prescribed (Usually 14 days), you will return the monitor device in the postage paid box/bag. Once it is returned they will download the data collected and provide with a report which the provider will then review and we will call you with those results.  ? ?Important tips: ? ?Avoid showering during the first 24 hours of wearing the monitor. ?Avoid excessive sweating to help maximize wear time. ?Do not submerge the device, no hot tubs, and no swimming pools. ?Keep any lotions or oils away from the patch. ?After 24 hours you may shower with the patch on. Take brief showers with your back facing the  shower head.  ?Do not remove patch once it has been placed because that will interrupt data and decrease adhesive wear time. ?Push the button when you have any symptoms and write down what you were feeling. ?Once you have completed wearing your monitor, remove and place into box which has postage paid and place in your outgoing mailbox.  ?If for some reason you have misplaced your box then call our office and we can provide another box and/or mail it off for you. ?Keep the transmitter within 10 feet at all times.  ?Expect a welcome phone call within 24-48 hrs of application from Zio.  This call will include your copay information, so please answer any unknown phone calls while wearing Zio (it could also be important information about your heart) ?The envelope to return 05-21-1980 is in the back of the transmitter. Removal instructions are on the last page of the symptom diary.  ?Place the patch sticky side up inside the transmitter and the symptom diary inside the envelope to return on your last wear day inside your mailbox or any USPS mailbox.  ? ?Follow-Up: ?At [facility], you and your health needs are our priority.  As part of our continuing mission to provide you with exceptional heart care, we have created designated Provider Care Teams.  These Care Teams include your primary Cardiologist (physician) and Advanced Practice Providers (APPs -  Physician Assistants and Nurse Practitioners) who all work together to provide you with the care you need, when  you need it. ? ?We recommend signing up for the patient portal called "MyChart".  Sign up information is provided on this After Visit Summary.  MyChart is used to connect with patients for Virtual Visits (Telemedicine).  Patients are able to view lab/test results, encounter notes, upcoming appointments, etc.  Non-urgent messages can be sent to your provider as well.   ?To learn more about what you can do with MyChart, go to [email or web address].   ? ?Your next  appointment:   ?As scheduled  ? ? ?Other Instructions ? ?Heart Healthy Diet Recommendations: ?A low-salt diet is recommended. Meats should be grilled, baked, or boiled. Avoid fried foods. Focus on lean protein sources like fish or chicken with vegetables and fruits. The American Heart Association is a Chief Technology Officer!  American Heart Association Diet and Lifeystyle Recommendations   ? ?Exercise recommendations: ?The American Heart Association recommends 150 minutes of moderate intensity exercise weekly. ?Try 30 minutes of moderate intensity exercise 4-5 times per week. ?This could include walking, jogging, or swimming. ? ? ?Important Information About Sugar ? ? ? ? ?  ?

## 2021-10-28 ENCOUNTER — Telehealth (HOSPITAL_BASED_OUTPATIENT_CLINIC_OR_DEPARTMENT_OTHER): Payer: Self-pay

## 2021-10-28 DIAGNOSIS — I10 Essential (primary) hypertension: Secondary | ICD-10-CM

## 2021-10-28 LAB — BASIC METABOLIC PANEL
BUN/Creatinine Ratio: 17 (ref 12–28)
BUN: 24 mg/dL (ref 8–27)
CO2: 31 mmol/L — ABNORMAL HIGH (ref 20–29)
Calcium: 9.9 mg/dL (ref 8.7–10.3)
Chloride: 98 mmol/L (ref 96–106)
Creatinine, Ser: 1.45 mg/dL — ABNORMAL HIGH (ref 0.57–1.00)
Glucose: 98 mg/dL (ref 70–99)
Potassium: 5 mmol/L (ref 3.5–5.2)
Sodium: 139 mmol/L (ref 134–144)
eGFR: 39 mL/min/{1.73_m2} — ABNORMAL LOW (ref >59)

## 2021-10-28 LAB — MAGNESIUM: Magnesium: 2 mg/dL (ref 1.6–2.3)

## 2021-10-28 NOTE — Telephone Encounter (Addendum)
Call attempt, no answer, unable to leave message  ? ? ?----- Message from Alver Sorrow, NP sent at 10/28/2021  7:40 AM EDT ----- ?Normal electrolytes including magnesium, potassium.  Her kidney function is decreased compared to previous.  Suggestive of dehydration.  Recommend hold indapamide (lozol)  for 2 days then resume usual dosing.  Please increase fluid intake.  Repeat BMP in 2 weeks for monitoring. ?

## 2021-10-28 NOTE — Telephone Encounter (Addendum)
Results call to patients daughter, okper DPR, verbalizes understanding and labs mailed to patients home  ? ? ? ?----- Message from Alver Sorrow, NP sent at 10/28/2021  7:40 AM EDT ----- ?Normal electrolytes including magnesium, potassium.  Her kidney function is decreased compared to previous.  Suggestive of dehydration.  Recommend hold indapamide for 2 days then resume usual dosing.  Please increase fluid intake.  Repeat BMP in 2 weeks for monitoring. ?

## 2021-11-02 ENCOUNTER — Encounter (HOSPITAL_COMMUNITY): Payer: Self-pay

## 2021-11-02 ENCOUNTER — Telehealth (HOSPITAL_COMMUNITY): Payer: Self-pay

## 2021-11-02 NOTE — Telephone Encounter (Signed)
Attempted to call patient in regards to Cardiac Rehab - unable to leave VM, VM box not set up.   Mailed letter 

## 2021-11-03 ENCOUNTER — Other Ambulatory Visit: Payer: Self-pay | Admitting: Surgical

## 2021-11-03 ENCOUNTER — Other Ambulatory Visit: Payer: Self-pay | Admitting: Internal Medicine

## 2021-11-04 ENCOUNTER — Ambulatory Visit
Admission: RE | Admit: 2021-11-04 | Discharge: 2021-11-04 | Disposition: A | Discharge status: Home / Self Care | Payer: Medicare Other | Source: Ambulatory Visit | Attending: Endocrinology | Admitting: Endocrinology

## 2021-11-04 DIAGNOSIS — Z1231 Encounter for screening mammogram for malignant neoplasm of breast: Secondary | ICD-10-CM

## 2021-11-10 ENCOUNTER — Telehealth (HOSPITAL_COMMUNITY): Payer: Self-pay

## 2021-11-10 ENCOUNTER — Encounter (HOSPITAL_BASED_OUTPATIENT_CLINIC_OR_DEPARTMENT_OTHER): Payer: Self-pay

## 2021-11-10 NOTE — Telephone Encounter (Signed)
Pt returned phone call and stated that she received the letter that was mailed, and that she would like to schedule for cardiac rehab. I advised pt that we were full for the month of May and that we would give her a call when the June schedule was available. Pt was upset and stated that her phone number beginning with [phone] needed to be called first. I advised pt that if we had any cancellations for the month of May we would give her a call. ?

## 2021-11-14 ENCOUNTER — Telehealth (HOSPITAL_BASED_OUTPATIENT_CLINIC_OR_DEPARTMENT_OTHER): Payer: Self-pay

## 2021-11-14 NOTE — Telephone Encounter (Addendum)
Call attempt no answer, unable to leave message  ? ?----- Message from Laurann Montana, PA-C sent at 11/14/2021  4:22 PM EDT ----- ?Please let pt know monitor was overall reassuring, average HR 55. There were very rare skips. She did have low HRs tending to occur during nighttime hours. Overall these findings are all very common. We sometimes see the lower nighttime HRs related to medication or in patients who have sleep apnea. ? ? ? Can you find out how she is feeling since Rena Lara adjusted her medicines, and whether she snores or has daytime fatigue?  ? ?Luther Parody had stopped amiodarone and decreased atenolol from 50mg  to 25mg  but both atenolols are still on med list so just need make sure she made those changes. Based on her reply we will advise any further med changes/testing. Thanks! ?

## 2021-11-15 ENCOUNTER — Telehealth (HOSPITAL_COMMUNITY): Payer: Self-pay

## 2021-11-15 NOTE — Telephone Encounter (Signed)
Call attempt, no answer, unable to leave message  ?

## 2021-11-15 NOTE — Telephone Encounter (Signed)
Successful telephone encounter to patient to confirm cardiac rehab orientation appointment for 11/17/21 at 10:00 am. Health history completed. Spoke with daughter per patient request to provide directions to appointment and cardiac rehab contact. All questions answered. Patient and daughter appreciative of call.  ?

## 2021-11-15 NOTE — Addendum Note (Signed)
Addended by: Marlene Lard on: 11/15/2021 10:21 AM ? ? Modules accepted: Orders ? ?

## 2021-11-17 ENCOUNTER — Telehealth (HOSPITAL_BASED_OUTPATIENT_CLINIC_OR_DEPARTMENT_OTHER): Payer: Self-pay

## 2021-11-17 ENCOUNTER — Encounter (HOSPITAL_COMMUNITY): Payer: Self-pay

## 2021-11-17 ENCOUNTER — Encounter (HOSPITAL_COMMUNITY)
Admission: RE | Admit: 2021-11-17 | Discharge: 2021-11-17 | Disposition: A | Discharge status: Home / Self Care | Payer: Medicare Other | Source: Ambulatory Visit | Attending: Internal Medicine | Admitting: Internal Medicine

## 2021-11-17 VITALS — BP 122/70 | HR 56 | Ht 59.0 in | Wt 155.3 lb

## 2021-11-17 DIAGNOSIS — I252 Old myocardial infarction: Secondary | ICD-10-CM | POA: Insufficient documentation

## 2021-11-17 DIAGNOSIS — Z951 Presence of aortocoronary bypass graft: Secondary | ICD-10-CM | POA: Insufficient documentation

## 2021-11-17 DIAGNOSIS — E1165 Type 2 diabetes mellitus with hyperglycemia: Secondary | ICD-10-CM | POA: Insufficient documentation

## 2021-11-17 DIAGNOSIS — I214 Non-ST elevation (NSTEMI) myocardial infarction: Secondary | ICD-10-CM

## 2021-11-17 HISTORY — DX: Atherosclerotic heart disease of native coronary artery without angina pectoris: I25.10

## 2021-11-17 NOTE — Progress Notes (Addendum)
Cardiac Individual Treatment Plan  Patient Details  Name: Tiffany Odonnell MRN: [id number] Date of Birth: 1950-11-26 Referring Provider:   Flowsheet Row CARDIAC REHAB PHASE II ORIENTATION from 11/17/2021 in [facility]  Referring Provider Dr. 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  Patient's Home Medications on Admission:  Current Outpatient Medications:    acetaminophen (TYLENOL) 500 MG tablet, Take 1,000 mg by mouth every 6 (six) hours as needed for headache (pain)., Disp: , Rfl:    aspirin EC 81 MG tablet, Take 1 tablet (81 mg total) by mouth daily., Disp: 14 tablet, Rfl: 1   atenolol (TENORMIN) 25 MG tablet, Take 1 tablet (25 mg total) by mouth daily., Disp: 90 tablet, Rfl: 3   betamethasone dipropionate 0.05 % lotion, Apply 1 application topically 2 (two) times daily as needed (scalp rash)., Disp: , Rfl: 2   Carboxymethylcellulose Sodium (THERATEARS OP), Place 1 drop into both eyes 2 (two) times daily as needed (dry eyes)., Disp: , Rfl:    docusate sodium (COLACE) 100 MG capsule, Take 1 capsule (100 mg total) by mouth daily as needed for up to 30 doses. (Patient taking differently: Take 100 mg by mouth daily as needed (constipation).), Disp: 30 capsule, Rfl: 0   hydrocortisone (ANUSOL-HC) 2.5 % rectal cream, Place 1 application rectally 3 (three) times daily as needed for hemorrhoids., Disp: , Rfl:    indapamide (LOZOL) 2.5 MG tablet, Take 2.5 mg by mouth every morning., Disp: , Rfl:    Potassium Citrate 15 MEQ (1620 MG) TBCR, Take 15 mEq by mouth in the morning and at bedtime., Disp: , Rfl:    rosuvastatin (CRESTOR) 20 MG tablet, TAKE 1 TABLET EVERY DAY, Disp: 30 tablet, Rfl: 10   Ascorbic Acid (VITAMIN C) 1000 MG tablet, Take 2,000 mg by mouth daily as  needed (immune support)., Disp: , Rfl:    ECHINACEA PO, Take 2 capsules by mouth daily as needed (immune system boost)., Disp: , Rfl:    MAGNESIUM PO, Take 1 tablet by mouth at bedtime as needed (relax nervew)., Disp: , Rfl:    Multiple Vitamins-Minerals (ZINC PO), Take 1 tablet by mouth at bedtime as needed (to relax nerves)., Disp: , Rfl:    oxyCODONE (OXY IR/ROXICODONE) 5 MG immediate release tablet, Take 1 tablet (5 mg total) by mouth every 6 (six) hours as needed for severe pain. (Patient not taking: Reported on 11/16/2021), Disp: 28 tablet, Rfl: 0  Past Medical History: Past Medical History:  Diagnosis Date   Anxiety    Blood in urine    Cervical disc disorder    bulging disc   Coronary artery disease    Diabetes mellitus without complication (HCC)    Dizziness    Falls    Fibromyalgia    H/O Salmonella gastroenteritis    High cholesterol    History of frequent urinary tract infections    History of hiatal hernia    History of vertigo    Hypertension    Imbalance    Kidney stones    Lichen planopilaris    Lumbar herniated disc    L5-L6   Numbness and tingling    hands and feet bilat    Oscillopsia    Peripheral neuropathy  Septicemia ([place])    2013   Sinusitis    Sjogren's disease ([place])    Tinnitus    Wears glasses     Tobacco Use: Social History   Tobacco Use  Smoking Status Never  Smokeless Tobacco Never    Labs: Review Flowsheet        Latest Ref Rng & Units 06/22/2021 08/01/2021 08/02/2021 08/05/2021  Labs for ITP Cardiac and Pulmonary Rehab  Cholestrol 0 - 200 mg/dL   222     LDL (calc) 0 - 99 mg/dL   146     HDL-C >40 mg/dL   56     Trlycerides <150 mg/dL   98     Hemoglobin A1c 4.8 - 5.6 % 6.0   5.9      PH, Arterial 7.350 - 7.450    7.392     7.366     7.378     7.416     7.252     7.406     7.501     7.443     7.368     7.411    PCO2 arterial 32.0 - 48.0 mmHg    38.2     40.4     40.2     36.2     47.7     40.8     30.8     36.9      40.8     36.2    Bicarbonate 20.0 - 28.0 mmol/L    23.3     23.2     23.7     23.5     21.5     25.6     24.1     25.3     23.5     22.5    TCO2 22 - 32 mmol/L    24     24     25     25     23     24     27     25     25     26     26     25     25     Acid-base deficit 0.0 - 2.0 mmol/L    2.0     2.0     1.0     1.0     6.0     2.0     1.4    O2 Saturation %    99.0     99.0     99.0     99.0     93.0     100.0     100.0     100.0     100.0     95.8        Multiple values from one day are sorted in reverse-chronological order        Capillary Blood Glucose: Lab Results  Component Value Date   GLUCAP 130 (H) 08/10/2021   GLUCAP 127 (H) 08/09/2021   GLUCAP 110 (H) 08/09/2021   GLUCAP 119 (H) 08/09/2021   GLUCAP 107 (H) 08/09/2021     Exercise Target Goals: Exercise Program Goal: Individual exercise prescription set using results from initial 6 min walk test and THRR while considering  patient's activity barriers and safety.   Exercise Prescription Goal: Starting with aerobic activity 30 plus minutes a day, 3 days per week for initial exercise prescription. Provide home exercise prescription and guidelines that participant  acknowledges understanding prior to discharge.  Activity Barriers & Risk Stratification:  Activity Barriers & Cardiac Risk Stratification - 11/17/21 1254       Activity Barriers & Cardiac Risk Stratification   Activity Barriers Joint Problems;Balance Concerns;Deconditioning;History of Falls;Assistive Device;Incisional Pain    Cardiac Risk Stratification High             6 Minute Walk:  6 Minute Walk     Row Name 11/17/21 1249         6 Minute Walk   Phase Initial     Distance 1241 feet     Walk Time 6 minutes     # of Rest Breaks 0     MPH 2.35     METS 2.07     RPE 10     Perceived Dyspnea  0     VO2 Peak 7.26     Symptoms No     Resting HR 56 bpm     Resting BP 122/70     Resting Oxygen  Saturation  99 %     Exercise Oxygen Saturation  during 6 min walk 96 %     Max Ex. HR 71 bpm     Max Ex. BP 122/70     2 Minute Post BP 126/74              Oxygen Initial Assessment:   Oxygen Re-Evaluation:   Oxygen Discharge (Final Oxygen Re-Evaluation):   Initial Exercise Prescription:  Initial Exercise Prescription - 11/17/21 1200       Date of Initial Exercise RX and Referring Provider   Date 11/17/21    Referring Provider Dr. Lyman Bishop MD    Expected Discharge Date 01/13/22      NuStep   Level 1    SPM 80    Minutes 30    METs 1.7      Prescription Details   Frequency (times per week) 3    Duration Progress to 30 minutes of continuous aerobic without signs/symptoms of physical distress      Intensity   THRR 40-80% of Max Heartrate 60-120    Ratings of Perceived Exertion 11-13    Perceived Dyspnea 0-4      Progression   Progression Continue progressive overload as per policy without signs/symptoms or physical distress.      Resistance Training   Training Prescription Yes    Weight 2    Reps 10-15             Perform Capillary Blood Glucose checks as needed.  Exercise Prescription Changes:   Exercise Comments:   Exercise Goals and Review:   Exercise Goals     Row Name 11/17/21 1257             Exercise Goals   Increase Physical Activity Yes       Intervention Provide advice, education, support and counseling about physical activity/exercise needs.;Develop an individualized exercise prescription for aerobic and resistive training based on initial evaluation findings, risk stratification, comorbidities and participant's personal goals.       Expected Outcomes Short Term: Attend rehab on a regular basis to increase amount of physical activity.;Long Term: Add in home exercise to make exercise part of routine and to increase amount of physical activity.;Long Term: Exercising regularly at least 3-5 days a week.       Increase Strength  and Stamina Yes       Intervention Provide advice, education, support and counseling about physical activity/exercise  needs.;Develop an individualized exercise prescription for aerobic and resistive training based on initial evaluation findings, risk stratification, comorbidities and participant's personal goals.       Expected Outcomes Short Term: Increase workloads from initial exercise prescription for resistance, speed, and METs.;Short Term: Perform resistance training exercises routinely during rehab and add in resistance training at home;Long Term: Improve cardiorespiratory fitness, muscular endurance and strength as measured by increased METs and functional capacity (6MWT)       Able to understand and use rate of perceived exertion (RPE) scale Yes       Intervention Provide education and explanation on how to use RPE scale       Expected Outcomes Short Term: Able to use RPE daily in rehab to express subjective intensity level;Long Term:  Able to use RPE to guide intensity level when exercising independently       Knowledge and understanding of Target Heart Rate Range (THRR) Yes       Intervention Provide education and explanation of THRR including how the numbers were predicted and where they are located for reference       Expected Outcomes Short Term: Able to state/look up THRR;Long Term: Able to use THRR to govern intensity when exercising independently;Short Term: Able to use daily as guideline for intensity in rehab       Understanding of Exercise Prescription Yes       Intervention Provide education, explanation, and written materials on patient's individual exercise prescription       Expected Outcomes Short Term: Able to explain program exercise prescription;Long Term: Able to explain home exercise prescription to exercise independently                Exercise Goals Re-Evaluation :    Discharge Exercise Prescription (Final Exercise Prescription Changes):   Nutrition:  Target  Goals: Understanding of nutrition guidelines, daily intake of sodium <153m, cholesterol <2063m calories 30% from fat and 7% or less from saturated fats, daily to have 5 or more servings of fruits and vegetables.  Biometrics:  Pre Biometrics - 11/17/21 1242       Pre Biometrics   Waist Circumference 36 inches    Hip Circumference 42.25 inches    Waist to Hip Ratio 0.85 %    Triceps Skinfold 20 mm    % Body Fat 40.7 %    Grip Strength 19 kg    Flexibility 20.25 in    Single Leg Stand 1.6 seconds   1.6             Nutrition Therapy Plan and Nutrition Goals:   Nutrition Assessments:  MEDIFICTS Score Key: ?70 Need to make dietary changes  40-70 Heart Healthy Diet ? 40 Therapeutic Level Cholesterol Diet   Picture Your Plate Scores: <4<53nhealthy dietary pattern with much room for improvement. 41-50 Dietary pattern unlikely to meet recommendations for good health and room for improvement. 51-60 More healthful dietary pattern, with some room for improvement.  >60 Healthy dietary pattern, although there may be some specific behaviors that could be improved.    Nutrition Goals Re-Evaluation:   Nutrition Goals Discharge (Final Nutrition Goals Re-Evaluation):   Psychosocial: Target Goals: Acknowledge presence or absence of significant depression and/or stress, maximize coping skills, provide positive support system. Participant is able to verbalize types and ability to use techniques and skills needed for reducing stress and depression.  Initial Review & Psychosocial Screening:  Initial Psych Review & Screening - 11/17/21 1512       Initial  Review   Current issues with Current Stress Concerns    Source of Stress Concerns Family    Comments Abreanna's grandaughter just received a bone marrow biopsy. The whole family has been assisting with her recovery caring for her small great grandchildren      [place]? Yes   Lajuanda has her daughter for  support who she lives with   Comments Will review quality of life in the upcoming week and offer support as needed      Barriers   Psychosocial barriers to participate in program The patient should benefit from training in stress management and relaxation.      Screening Interventions   Interventions To provide support and resources with identified psychosocial needs;Encouraged to exercise;Provide feedback about the scores to participant    Expected Outcomes Long Term Goal: Stressors or current issues are controlled or eliminated.;Short Term goal: Identification and review with participant of any Quality of Life or Depression concerns found by scoring the questionnaire.;Long Term goal: The participant improves quality of Life and PHQ9 Scores as seen by post scores and/or verbalization of changes             Quality of Life Scores:  Quality of Life - 11/17/21 1241       Quality of Life   Select Quality of Life      Quality of Life Scores   Health/Function Pre 18.15 %    Socioeconomic Pre 21.5 %    Psych/Spiritual Pre 23.57 %    Family Pre 19.5 %    GLOBAL Pre 20.31 %            Scores of 19 and below usually indicate a poorer quality of life in these areas.  A difference of  2-3 points is a clinically meaningful difference.  A difference of 2-3 points in the total score of the Quality of Life Index has been associated with significant improvement in overall quality of life, self-image, physical symptoms, and general health in studies assessing change in quality of life.  PHQ-9: Review Flowsheet        11/17/2021  Depression screen PHQ 2/9  Decreased Interest 0  Down, Depressed, Hopeless 0  PHQ - 2 Score 0         Interpretation of Total Score  Total Score Depression Severity:  1-4 = Minimal depression, 5-9 = Mild depression, 10-14 = Moderate depression, 15-19 = Moderately severe depression, 20-27 = Severe depression   Psychosocial Evaluation and  Intervention:   Psychosocial Re-Evaluation:   Psychosocial Discharge (Final Psychosocial Re-Evaluation):   Vocational Rehabilitation: Provide vocational rehab assistance to qualifying candidates.   Vocational Rehab Evaluation & Intervention:  Vocational Rehab - 11/17/21 1516       Initial Vocational Rehab Evaluation & Intervention   Assessment shows need for Vocational Rehabilitation No   Giavanni is retired and does not need vocatinal rehab at this time            Education: Education Goals: Education classes will be provided on a weekly basis, covering required topics. Participant will state understanding/return demonstration of topics presented.  Learning Barriers/Preferences:  Learning Barriers/Preferences - 11/17/21 1300       Learning Barriers/Preferences   Learning Barriers Exercise Concerns    Learning Preferences Computer/Internet;Pictoral;Written Material             Education Topics: Hypertension, Hypertension Reduction -Define heart disease and high blood pressure. Discus how high blood pressure affects the body and  ways to reduce high blood pressure.   Exercise and Your Heart -Discuss why it is important to exercise, the FITT principles of exercise, normal and abnormal responses to exercise, and how to exercise safely.   Angina -Discuss definition of angina, causes of angina, treatment of angina, and how to decrease risk of having angina.   Cardiac Medications -Review what the following cardiac medications are used for, how they affect the body, and side effects that may occur when taking the medications.  Medications include Aspirin, Beta blockers, calcium channel blockers, ACE Inhibitors, angiotensin receptor blockers, diuretics, digoxin, and antihyperlipidemics.   Congestive Heart Failure -Discuss the definition of CHF, how to live with CHF, the signs and symptoms of CHF, and how keep track of weight and sodium intake.   Heart Disease and  Intimacy -Discus the effect sexual activity has on the heart, how changes occur during intimacy as we age, and safety during sexual activity.   Smoking Cessation / COPD -Discuss different methods to quit smoking, the health benefits of quitting smoking, and the definition of COPD.   Nutrition I: Fats -Discuss the types of cholesterol, what cholesterol does to the heart, and how cholesterol levels can be controlled.   Nutrition II: Labels -Discuss the different components of food labels and how to read food label   Heart Parts/Heart Disease and PAD -Discuss the anatomy of the heart, the pathway of blood circulation through the heart, and these are affected by heart disease.   Stress I: Signs and Symptoms -Discuss the causes of stress, how stress may lead to anxiety and depression, and ways to limit stress.   Stress II: Relaxation -Discuss different types of relaxation techniques to limit stress.   Warning Signs of Stroke / TIA -Discuss definition of a stroke, what the signs and symptoms are of a stroke, and how to identify when someone is having stroke.   Knowledge Questionnaire Score:  Knowledge Questionnaire Score - 11/17/21 1301       Knowledge Questionnaire Score   Pre Score 19/24             Core Components/Risk Factors/Patient Goals at Admission:  Personal Goals and Risk Factors at Admission - 11/17/21 1305       Core Components/Risk Factors/Patient Goals on Admission    Weight Management Yes;Obesity;Weight Loss    Intervention Weight Management: Develop a combined nutrition and exercise program designed to reach desired caloric intake, while maintaining appropriate intake of nutrient and fiber, sodium and fats, and appropriate energy expenditure required for the weight goal.;Weight Management: Provide education and appropriate resources to help participant work on and attain dietary goals.;Weight Management/Obesity: Establish reasonable short term and long  term weight goals.;Obesity: Provide education and appropriate resources to help participant work on and attain dietary goals.    Admit Weight 155 lb 6.8 oz (70.5 kg)    Expected Outcomes Short Term: Continue to assess and modify interventions until short term weight is achieved;Long Term: Adherence to nutrition and physical activity/exercise program aimed toward attainment of established weight goal;Weight Loss: Understanding of general recommendations for a balanced deficit meal plan, which promotes 1-2 lb weight loss per week and includes a negative energy balance of [phone] kcal/d;Understanding recommendations for meals to include 15-35% energy as protein, 25-35% energy from fat, 35-60% energy from carbohydrates, less than 230m of dietary cholesterol, 20-35 gm of total fiber daily;Understanding of distribution of calorie intake throughout the day with the consumption of 4-5 meals/snacks    Diabetes Yes  Intervention Provide education about signs/symptoms and action to take for hypo/hyperglycemia.;Provide education about proper nutrition, including hydration, and aerobic/resistive exercise prescription along with prescribed medications to achieve blood glucose in normal ranges: Fasting glucose 65-99 mg/dL    Expected Outcomes Short Term: Participant verbalizes understanding of the signs/symptoms and immediate care of hyper/hypoglycemia, proper foot care and importance of medication, aerobic/resistive exercise and nutrition plan for blood glucose control.;Long Term: Attainment of HbA1C < 7%.    Hypertension Yes    Intervention Provide education on lifestyle modifcations including regular physical activity/exercise, weight management, moderate sodium restriction and increased consumption of fresh fruit, vegetables, and low fat dairy, alcohol moderation, and smoking cessation.;Monitor prescription use compliance.    Expected Outcomes Short Term: Continued assessment and intervention until BP is < 140/47m  HG in hypertensive participants. < 130/81mHG in hypertensive participants with diabetes, heart failure or chronic kidney disease.;Long Term: Maintenance of blood pressure at goal levels.    Lipids Yes    Intervention Provide education and support for participant on nutrition & aerobic/resistive exercise along with prescribed medications to achieve LDL <7059mHDL >50m49m  Expected Outcomes Short Term: Participant states understanding of desired cholesterol values and is compliant with medications prescribed. Participant is following exercise prescription and nutrition guidelines.;Long Term: Cholesterol controlled with medications as prescribed, with individualized exercise RX and with personalized nutrition plan. Value goals: LDL < 70mg68mL > 40 mg.    Stress Yes    Intervention Offer individual and/or small group education and counseling on adjustment to heart disease, stress management and health-related lifestyle change. Teach and support self-help strategies.;Refer participants experiencing significant psychosocial distress to appropriate mental health specialists for further evaluation and treatment. When possible, include family members and significant others in education/counseling sessions.    Expected Outcomes Short Term: Participant demonstrates changes in health-related behavior, relaxation and other stress management skills, ability to obtain effective social support, and compliance with psychotropic medications if prescribed.;Long Term: Emotional wellbeing is indicated by absence of clinically significant psychosocial distress or social isolation.    Personal Goal Other Yes    Personal Goal Long and short term: strength, wt loss and balance    Intervention Will continue to monitor pt and progress workloads as tolerated without dign or symptom    Expected Outcomes Pt will achieve her goals             Core Components/Risk Factors/Patient Goals Review:    Core Components/Risk  Factors/Patient Goals at Discharge (Final Review):    ITP Comments:  ITP Comments     Row Name 11/17/21 1508           ITP Comments Dr TraciFransico HimMedical Director                Comments: LydiaAlma Friendlynded orientation on 11/17/2021 to review rules and guidelines for program.  Completed 6 minute walk test, Intitial ITP, and exercise prescription.  VSS. Telemetry-Sinus Brady, Sinus Rhythm.  Asymptomatic. LydiaChiniqua a rollator for stability. Safety measures and social distancing in place per CDC guidelines.MariaHarrell GaveSN

## 2021-11-17 NOTE — Telephone Encounter (Addendum)
3rd call attempt, no answer, results mailed to patient with instruction to call upon receipt.     ----- Message from Charlie Pitter, PA-C sent at 11/14/2021  4:22 PM EDT ----- Please let pt know monitor was overall reassuring, average HR 55. There were very rare skips. She did have low HRs tending to occur during nighttime hours. Overall these findings are all very common. We sometimes see the lower nighttime HRs related to medication or in patients who have sleep apnea. Can you find out how she is feeling since [place] adjusted her medicines, and whether she snores or has daytime fatigue? Urban Gibson had stopped amiodarone and decreased atenolol from 50mg  to 25mg  but both atenolols are still on med list so just need make sure she made those changes. Based on her reply we will advise any further med changes/testing. Thanks!

## 2021-11-21 ENCOUNTER — Encounter (HOSPITAL_COMMUNITY)
Admission: RE | Admit: 2021-11-21 | Discharge: 2021-11-21 | Disposition: A | Discharge status: Home / Self Care | Payer: Medicare Other | Source: Ambulatory Visit | Attending: Internal Medicine | Admitting: Internal Medicine

## 2021-11-21 DIAGNOSIS — I252 Old myocardial infarction: Secondary | ICD-10-CM | POA: Diagnosis present

## 2021-11-21 DIAGNOSIS — Z951 Presence of aortocoronary bypass graft: Secondary | ICD-10-CM | POA: Diagnosis not present

## 2021-11-21 DIAGNOSIS — E1165 Type 2 diabetes mellitus with hyperglycemia: Secondary | ICD-10-CM | POA: Diagnosis not present

## 2021-11-21 DIAGNOSIS — I214 Non-ST elevation (NSTEMI) myocardial infarction: Secondary | ICD-10-CM

## 2021-11-21 LAB — GLUCOSE, CAPILLARY
Glucose-Capillary: 94 mg/dL (ref 70–99)
Glucose-Capillary: 96 mg/dL (ref 70–99)

## 2021-11-21 NOTE — Progress Notes (Signed)
Daily Session Note  Patient Details  Name: Tiffany Odonnell MRN: [id number] Date of Birth: August 28, 1950 Referring Provider:   Flowsheet Row CARDIAC REHAB PHASE II ORIENTATION from 11/17/2021 in [place]  Referring Provider Dr. Lyman Bishop MD       Encounter Date: 11/21/2021  Check In:  Session Check In - 11/21/21 1458       Check-In   Supervising physician immediately available to respond to emergencies [facility] immediately available    Physician(s) Dr Tawanna Solo    Location [facility]    Staff Present Barnet Pall, RN, Deland Pretty, MS, ACSM CEP, Exercise Physiologist;David Lilyan Punt, MS, ACSM-CEP, CCRP, Exercise Physiologist    Virtual Visit No    Medication changes reported     No    Fall or balance concerns reported    No    Tobacco Cessation No Change    Warm-up and Cool-down Performed as group-led instruction    Resistance Training Performed No    VAD Patient? No    PAD/SET Patient? No      Pain Assessment   Currently in Pain? No/denies    Pain Score 0-No pain    Multiple Pain Sites No             Capillary Blood Glucose: Results for orders placed or performed during the hospital encounter of 11/21/21 (from the past 24 hour(s))  Glucose, capillary     Status: None   Collection Time: 11/21/21  3:05 PM  Result Value Ref Range   Glucose-Capillary 94 70 - 99 mg/dL  Glucose, capillary     Status: None   Collection Time: 11/21/21  3:46 PM  Result Value Ref Range   Glucose-Capillary 96 70 - 99 mg/dL     Exercise Prescription Changes - 11/21/21 1507       Response to Exercise   Blood Pressure (Admit) 118/72    Blood Pressure (Exercise) 146/86    Blood Pressure (Exit) 118/74    Heart Rate (Admit) 56 bpm    Heart Rate (Exercise) 70 bpm    Heart Rate (Exit) 55 bpm    Rating of Perceived Exertion (Exercise) 9    Symptoms None    Comments Off to a good start with exercise. Increased WL on NuStep  from 1 to 2.    Duration Continue with 30 min of aerobic exercise without signs/symptoms of physical distress.    Intensity THRR unchanged      Progression   Progression Continue to progress workloads to maintain intensity without signs/symptoms of physical distress.    Average METs 2      Resistance Training   Training Prescription No      Interval Training   Interval Training No      NuStep   Level 2    SPM 72    Minutes 30    METs 2             Social History   Tobacco Use  Smoking Status Never  Smokeless Tobacco Never    Goals Met:  Exercise tolerated well No report of concerns or symptoms today Strength training completed today  Goals Unmet:  Not Applicable  Comments: Pt started cardiac rehab today.  Pt tolerated light exercise without difficulty. VSS, telemetry-Sinus  Rhythm, first degree heart block, asymptomatic.  Medication list reconciled. Pt denies barriers to medicaiton compliance.  PSYCHOSOCIAL ASSESSMENT:  PHQ-0. Pt exhibits positive coping skills, hopeful outlook with supportive family. No psychosocial needs  identified at this time, no psychosocial interventions necessary.   Pt oriented to exercise equipment and routine.    Understanding verbalized. Harrell Gave RN BSN    Dr. Fransico Him is Medical Director for Cardiac Rehab at [facility].

## 2021-11-22 NOTE — Progress Notes (Signed)
Cardiac Rehab Medication Review by a Nurse  Does the patient  feel that his/her medications are working for him/her?  yes  Has the patient been experiencing any side effects to the medications prescribed?  no  Does the patient measure his/her own blood pressure or blood glucose at home?  no   Does the patient have any problems obtaining medications due to transportation or finances?   no  Understanding of regimen: good Understanding of indications: good Potential of compliance: excellent    Nurse comments: Tracye is staking her medications as prescribed and has a good understanding of what her medications are for. Rosalynn checks her CBG's daily.    Arta Bruce [facility] RN 11/22/2021 10:07 AM

## 2021-11-22 NOTE — Progress Notes (Signed)
Tiffany Odonnell 71 y.o. female  Tiffany Odonnell is motivated to make lifestyle changes to aid with cardiac rehab. Patient has medical history of NSTEMI, CABGx3,DM2, HTN, HLD, fibromyalgia, Sjogrren's, hx of kidney stones, vertigo. She lives at home with her husband. She reports concerns about what to eat regarding inflammation and to prevent kidney stones. She reports eliminating night shades recently (peppers, egg plant, tomatoes). She reports craving salt and sugar. She prefers beef ribs and lamb. She does report decreasing salty foods like chips.   Labs: Cr 1.45, eGFR 39, cholesterol 222, LDL 146, A1c 5.9   RMR: 1130  24 hour recall:  Breakfast: 2 slices 45 kcal Lynnae Sandhoff bread with smoked herring or tuna or sardines Lunch: banana, blueberries or Skips Dinner: lamb or beef ribs   Nutrition Diagnosis Obesity related to excessive energy intake as evidenced by a 31.37  Inappropriate intake of saturated fats related to food related knowledge deficit as evidenced by pt's diet recall and LDL value of 146  Nutrition Intervention Pt's individual nutrition plan reviewed with pt. Benefits of adopting Heart Healthy diet discussed.  Continue client-centered nutrition education by RD, as part of interdisciplinary care.  Monitor/Evaluation: Patient reports motivation to make lifestyle changes for adherence to heart healthy diet recommendation, blood sugar control, decreased intake of inflammatory foods. We discussed adequate hydration, the Mediterranean diet, reducing salt intake, and reducing refined carbohydrates. Handouts/notes given. Patient amicable to RD suggestions and verbalizes understanding. Will follow-up as needed.   23 minutes spent in review of topics related to a heart healthy diet including sodium intake, blood sugar control, weight management, and fiber intake.  Goal(s) Follow the Mediterranean Diet as a guide for heart health, blood sugar control, and reduced inflammation (handouts  given) Reduce salt intake to <2359m per day to support kidney health, heart health. Reduce sugar intake from refined carbohydrates, sugary drinks, and sweet treats to aid with blood sugar control, heart health, and kidney function. Increase fluid intake to at least ~64oz/day to support kidney health Pt to identify and limit food sources of saturated fat, trans fat, refined carbohydrates and sodium Pt to identify food quantities necessary to achieve weight loss of 6-24 lb at graduation from cardiac rehab.  Pt able to name foods that affect blood glucose. Continue to limit simple sugars, refined carbohydrates, sugary beverages, etc.  Pt to describe the benefit of including lean protein/plant proteins, fruits, vegetables, whole grains, nuts/seeds, and low-fat dairy products in a heart healthy meal plan. Pt to practice mindful and intuitive eating exercises  Plan:  Will provide client-centered nutrition education as part of interdisciplinary care Monitor and evaluate progress toward nutrition goal with team.   Arelie Kuzel [place] MS, RDN, LDN

## 2021-11-23 ENCOUNTER — Encounter (HOSPITAL_COMMUNITY)
Admission: RE | Admit: 2021-11-23 | Discharge: 2021-11-23 | Disposition: A | Discharge status: Home / Self Care | Payer: Medicare Other | Source: Ambulatory Visit | Attending: Internal Medicine | Admitting: Internal Medicine

## 2021-11-23 DIAGNOSIS — I214 Non-ST elevation (NSTEMI) myocardial infarction: Secondary | ICD-10-CM

## 2021-11-23 DIAGNOSIS — Z951 Presence of aortocoronary bypass graft: Secondary | ICD-10-CM

## 2021-11-23 DIAGNOSIS — I252 Old myocardial infarction: Secondary | ICD-10-CM | POA: Diagnosis not present

## 2021-11-25 ENCOUNTER — Encounter (HOSPITAL_COMMUNITY)
Admission: RE | Admit: 2021-11-25 | Discharge: 2021-11-25 | Disposition: A | Discharge status: Home / Self Care | Payer: Medicare Other | Source: Ambulatory Visit | Attending: Internal Medicine | Admitting: Internal Medicine

## 2021-11-25 DIAGNOSIS — I252 Old myocardial infarction: Secondary | ICD-10-CM | POA: Diagnosis not present

## 2021-11-30 ENCOUNTER — Encounter (HOSPITAL_COMMUNITY)
Admission: RE | Admit: 2021-11-30 | Discharge: 2021-11-30 | Disposition: A | Discharge status: Home / Self Care | Payer: Medicare Other | Source: Ambulatory Visit | Attending: Internal Medicine | Admitting: Internal Medicine

## 2021-11-30 DIAGNOSIS — I214 Non-ST elevation (NSTEMI) myocardial infarction: Secondary | ICD-10-CM

## 2021-11-30 DIAGNOSIS — I252 Old myocardial infarction: Secondary | ICD-10-CM | POA: Diagnosis not present

## 2021-11-30 DIAGNOSIS — Z951 Presence of aortocoronary bypass graft: Secondary | ICD-10-CM

## 2021-12-02 ENCOUNTER — Encounter (HOSPITAL_COMMUNITY)
Admission: RE | Admit: 2021-12-02 | Discharge: 2021-12-02 | Disposition: A | Discharge status: Home / Self Care | Payer: Medicare Other | Source: Ambulatory Visit | Attending: Internal Medicine | Admitting: Internal Medicine

## 2021-12-02 DIAGNOSIS — Z951 Presence of aortocoronary bypass graft: Secondary | ICD-10-CM

## 2021-12-02 DIAGNOSIS — I214 Non-ST elevation (NSTEMI) myocardial infarction: Secondary | ICD-10-CM | POA: Diagnosis present

## 2021-12-05 ENCOUNTER — Encounter (HOSPITAL_COMMUNITY)
Admission: RE | Admit: 2021-12-05 | Discharge: 2021-12-05 | Disposition: A | Discharge status: Home / Self Care | Payer: Medicare Other | Source: Ambulatory Visit | Attending: Internal Medicine | Admitting: Internal Medicine

## 2021-12-05 DIAGNOSIS — Z951 Presence of aortocoronary bypass graft: Secondary | ICD-10-CM

## 2021-12-05 DIAGNOSIS — I214 Non-ST elevation (NSTEMI) myocardial infarction: Secondary | ICD-10-CM | POA: Diagnosis not present

## 2021-12-07 ENCOUNTER — Encounter (HOSPITAL_COMMUNITY)
Admission: RE | Admit: 2021-12-07 | Discharge: 2021-12-07 | Disposition: A | Discharge status: Home / Self Care | Payer: Medicare Other | Source: Ambulatory Visit | Attending: Internal Medicine | Admitting: Internal Medicine

## 2021-12-07 DIAGNOSIS — I214 Non-ST elevation (NSTEMI) myocardial infarction: Secondary | ICD-10-CM | POA: Diagnosis not present

## 2021-12-07 DIAGNOSIS — Z951 Presence of aortocoronary bypass graft: Secondary | ICD-10-CM

## 2021-12-07 NOTE — Progress Notes (Signed)
QUALITY OF LIFE SCORE REVIEW  Pt completed Quality of Life survey as a participant in Cardiac Rehab.  Scores 19..0 or below are considered low.  Pt score very low in several areas Overall 20.31, Health and Function 18.15, socioeconomic 21.50, physiological and spiritual 23.57, family 19.50. Patient quality of life slightly altered by physical constraints which limits ability to perform as prior to recent cardiac illness. Tiffany Odonnell denies being depressed and reports feeling better and has more energy .  Offered emotional support and reassurance.  Will continue to monitor and intervene as necessary.Harrell Gave RN BSN

## 2021-12-07 NOTE — Progress Notes (Signed)
Cardiac Individual Treatment Plan  Patient Details  Name: Tiffany Odonnell MRN: [id number] Date of Birth: Jun 12, 1951 Referring Provider:   Flowsheet Row CARDIAC REHAB PHASE II ORIENTATION from 11/17/2021 in [facility]  Referring Provider Dr. 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  Patient's Home Medications on Admission:  Current Outpatient Medications:    acetaminophen (TYLENOL) 500 MG tablet, Take 1,000 mg by mouth every 6 (six) hours as needed for headache (pain)., Disp: , Rfl:    Ascorbic Acid (VITAMIN C) 1000 MG tablet, Take 2,000 mg by mouth daily as needed (immune support)., Disp: , Rfl:    aspirin EC 81 MG tablet, Take 1 tablet (81 mg total) by mouth daily., Disp: 14 tablet, Rfl: 1   atenolol (TENORMIN) 25 MG tablet, Take 1 tablet (25 mg total) by mouth daily., Disp: 90 tablet, Rfl: 3   betamethasone dipropionate 0.05 % lotion, Apply 1 application topically 2 (two) times daily as needed (scalp rash)., Disp: , Rfl: 2   Carboxymethylcellulose Sodium (THERATEARS OP), Place 1 drop into both eyes 2 (two) times daily as needed (dry eyes)., Disp: , Rfl:    docusate sodium (COLACE) 100 MG capsule, Take 1 capsule (100 mg total) by mouth daily as needed for up to 30 doses. (Patient taking differently: Take 100 mg by mouth daily as needed (constipation).), Disp: 30 capsule, Rfl: 0   ECHINACEA PO, Take 2 capsules by mouth daily as needed (immune system boost)., Disp: , Rfl:    hydrocortisone (ANUSOL-HC) 2.5 % rectal cream, Place 1 application rectally 3 (three) times daily as needed for hemorrhoids., Disp: , Rfl:    indapamide (LOZOL) 2.5 MG tablet, Take 2.5 mg by mouth every morning., Disp: , Rfl:    MAGNESIUM PO, Take 1 tablet by mouth at bedtime as needed (relax  nervew)., Disp: , Rfl:    Multiple Vitamins-Minerals (ZINC PO), Take 1 tablet by mouth at bedtime as needed (to relax nerves)., Disp: , Rfl:    oxyCODONE (OXY IR/ROXICODONE) 5 MG immediate release tablet, Take 1 tablet (5 mg total) by mouth every 6 (six) hours as needed for severe pain. (Patient not taking: Reported on 11/16/2021), Disp: 28 tablet, Rfl: 0   Potassium Citrate 15 MEQ (1620 MG) TBCR, Take 15 mEq by mouth in the morning and at bedtime., Disp: , Rfl:    rosuvastatin (CRESTOR) 20 MG tablet, TAKE 1 TABLET EVERY DAY, Disp: 30 tablet, Rfl: 10  Past Medical History: Past Medical History:  Diagnosis Date   Anxiety    Blood in urine    Cervical disc disorder    bulging disc   Coronary artery disease    Diabetes mellitus without complication (HCC)    Dizziness    Falls    Fibromyalgia    H/O Salmonella gastroenteritis    High cholesterol    History of frequent urinary tract infections    History of hiatal hernia    History of vertigo    Hypertension    Imbalance    Kidney stones    Lichen planopilaris    Lumbar herniated disc    L5-L6   Numbness and tingling    hands and feet bilat    Oscillopsia    Peripheral neuropathy  Septicemia ([place])    2013   Sinusitis    Sjogren's disease ([place])    Tinnitus    Wears glasses     Tobacco Use: Social History   Tobacco Use  Smoking Status Never  Smokeless Tobacco Never    Labs: Review Flowsheet        Latest Ref Rng & Units 06/22/2021 08/01/2021 08/02/2021 08/05/2021  Labs for ITP Cardiac and Pulmonary Rehab  Cholestrol 0 - 200 mg/dL   222     LDL (calc) 0 - 99 mg/dL   146     HDL-C >40 mg/dL   56     Trlycerides <150 mg/dL   98     Hemoglobin A1c 4.8 - 5.6 % 6.0   5.9      PH, Arterial 7.350 - 7.450    7.392     7.366     7.378     7.416     7.252     7.406     7.501     7.443     7.368     7.411    PCO2 arterial 32.0 - 48.0 mmHg    38.2     40.4     40.2     36.2     47.7     40.8     30.8     36.9      40.8     36.2    Bicarbonate 20.0 - 28.0 mmol/L    23.3     23.2     23.7     23.5     21.5     25.6     24.1     25.3     23.5     22.5    TCO2 22 - 32 mmol/L    _0 Acid-base deficit 0.0 - 2.0 mmol/L    2.0     2.0     1.0     1.0     6.0     2.0     1.4    O2 Saturation %    99.0     99.0     99.0     99.0     93.0     100.0     100.0     100.0     100.0     95.8        Multiple values from one day are sorted in reverse-chronological order        Capillary Blood Glucose: Lab Results  Component Value Date   GLUCAP 96 11/21/2021   GLUCAP 94 11/21/2021   GLUCAP 130 (H) 08/10/2021   GLUCAP 127 (H) 08/09/2021   GLUCAP 110 (H) 08/09/2021     Exercise Target Goals: Exercise Program Goal: Individual exercise prescription set using results from initial 6 min walk test and THRR while considering  patient's activity barriers and safety.   Exercise Prescription Goal: Starting with aerobic activity 30 plus minutes a day, 3 days per week for initial exercise prescription. Provide home exercise prescription and guidelines that participant acknowledges understanding  prior to discharge.  Activity Barriers & Risk Stratification:  Activity Barriers & Cardiac Risk Stratification - 11/17/21 1254       Activity Barriers & Cardiac Risk Stratification   Activity Barriers Joint Problems;Balance Concerns;Deconditioning;History of Falls;Assistive Device;Incisional Pain    Cardiac Risk Stratification High             6 Minute Walk:  6 Minute Walk     Row Name 11/17/21 1249         6 Minute Walk   Phase Initial     Distance 1241 feet     Walk Time 6 minutes     # of Rest Breaks 0     MPH 2.35     METS 2.07     RPE 10     Perceived Dyspnea  0     VO2 Peak 7.26     Symptoms No     Resting HR 56 bpm     Resting BP 122/70     Resting Oxygen Saturation  99 %      Exercise Oxygen Saturation  during 6 min walk 96 %     Max Ex. HR 71 bpm     Max Ex. BP 122/70     2 Minute Post BP 126/74              Oxygen Initial Assessment:   Oxygen Re-Evaluation:   Oxygen Discharge (Final Oxygen Re-Evaluation):   Initial Exercise Prescription:  Initial Exercise Prescription - 11/17/21 1200       Date of Initial Exercise RX and Referring Provider   Date 11/17/21    Referring Provider Dr. Lyman Bishop MD    Expected Discharge Date 01/13/22      NuStep   Level 1    SPM 80    Minutes 30    METs 1.7      Prescription Details   Frequency (times per week) 3    Duration Progress to 30 minutes of continuous aerobic without signs/symptoms of physical distress      Intensity   THRR 40-80% of Max Heartrate 60-120    Ratings of Perceived Exertion 11-13    Perceived Dyspnea 0-4      Progression   Progression Continue progressive overload as per policy without signs/symptoms or physical distress.      Resistance Training   Training Prescription Yes    Weight 2    Reps 10-15             Perform Capillary Blood Glucose checks as needed.  Exercise Prescription Changes:   Exercise Prescription Changes     Row Name 11/21/21 1507             Response to Exercise   Blood Pressure (Admit) 118/72       Blood Pressure (Exercise) 146/86       Blood Pressure (Exit) 118/74       Heart Rate (Admit) 56 bpm       Heart Rate (Exercise) 70 bpm       Heart Rate (Exit) 55 bpm       Rating of Perceived Exertion (Exercise) 9       Symptoms None       Comments Off to a good start with exercise. Increased WL on NuStep from 1 to 2.       Duration Continue with 30 min of aerobic exercise without signs/symptoms of physical distress.       Intensity THRR unchanged  Progression   Progression Continue to progress workloads to maintain intensity without signs/symptoms of physical distress.       Average METs 2         Resistance Training    Training Prescription No         Interval Training   Interval Training No         NuStep   Level 2       SPM 72       Minutes 30       METs 2                Exercise Comments:   Exercise Comments     Row Name 11/21/21 1559           Exercise Comments Paitent tolerated 1st session of exericse well without symptoms.                Exercise Goals and Review:   Exercise Goals     Row Name 11/17/21 1257             Exercise Goals   Increase Physical Activity Yes       Intervention Provide advice, education, support and counseling about physical activity/exercise needs.;Develop an individualized exercise prescription for aerobic and resistive training based on initial evaluation findings, risk stratification, comorbidities and participant's personal goals.       Expected Outcomes Short Term: Attend rehab on a regular basis to increase amount of physical activity.;Long Term: Add in home exercise to make exercise part of routine and to increase amount of physical activity.;Long Term: Exercising regularly at least 3-5 days a week.       Increase Strength and Stamina Yes       Intervention Provide advice, education, support and counseling about physical activity/exercise needs.;Develop an individualized exercise prescription for aerobic and resistive training based on initial evaluation findings, risk stratification, comorbidities and participant's personal goals.       Expected Outcomes Short Term: Increase workloads from initial exercise prescription for resistance, speed, and METs.;Short Term: Perform resistance training exercises routinely during rehab and add in resistance training at home;Long Term: Improve cardiorespiratory fitness, muscular endurance and strength as measured by increased METs and functional capacity (6MWT)       Able to understand and use rate of perceived exertion (RPE) scale Yes       Intervention Provide education and explanation on how to use RPE  scale       Expected Outcomes Short Term: Able to use RPE daily in rehab to express subjective intensity level;Long Term:  Able to use RPE to guide intensity level when exercising independently       Knowledge and understanding of Target Heart Rate Range (THRR) Yes       Intervention Provide education and explanation of THRR including how the numbers were predicted and where they are located for reference       Expected Outcomes Short Term: Able to state/look up THRR;Long Term: Able to use THRR to govern intensity when exercising independently;Short Term: Able to use daily as guideline for intensity in rehab       Understanding of Exercise Prescription Yes       Intervention Provide education, explanation, and written materials on patient's individual exercise prescription       Expected Outcomes Short Term: Able to explain program exercise prescription;Long Term: Able to explain home exercise prescription to exercise independently  Exercise Goals Re-Evaluation :  Exercise Goals Re-Evaluation     [place] Name 11/21/21 1559             Exercise Goal Re-Evaluation   Exercise Goals Review Increase Physical Activity;Able to understand and use rate of perceived exertion (RPE) scale       Comments Patient able to understand and use RPE scale appropriately. Currently walking 30-45 minutes at home and asked if it was OK to walk 30-45 minutes twice daily. I suggested patient stick to once a day for now. Patiient will walk 30-45 minutes on the days she doesn't attend cardiac rehab.       Expected Outcomes Progress workloads as tolerated to help improve cardiorespiratory fitness.                 Discharge Exercise Prescription (Final Exercise Prescription Changes):  Exercise Prescription Changes - 11/21/21 1507       Response to Exercise   Blood Pressure (Admit) 118/72    Blood Pressure (Exercise) 146/86    Blood Pressure (Exit) 118/74    Heart Rate (Admit) 56 bpm    Heart  Rate (Exercise) 70 bpm    Heart Rate (Exit) 55 bpm    Rating of Perceived Exertion (Exercise) 9    Symptoms None    Comments Off to a good start with exercise. Increased WL on NuStep from 1 to 2.    Duration Continue with 30 min of aerobic exercise without signs/symptoms of physical distress.    Intensity THRR unchanged      Progression   Progression Continue to progress workloads to maintain intensity without signs/symptoms of physical distress.    Average METs 2      Resistance Training   Training Prescription No      Interval Training   Interval Training No      NuStep   Level 2    SPM 72    Minutes 30    METs 2             Nutrition:  Target Goals: Understanding of nutrition guidelines, daily intake of sodium '1500mg'$ , cholesterol '200mg'$ , calories 30% from fat and 7% or less from saturated fats, daily to have 5 or more servings of fruits and vegetables.  Biometrics:  Pre Biometrics - 11/17/21 1242       Pre Biometrics   Waist Circumference 36 inches    Hip Circumference 42.25 inches    Waist to Hip Ratio 0.85 %    Triceps Skinfold 20 mm    % Body Fat 40.7 %    Grip Strength 19 kg    Flexibility 20.25 in    Single Leg Stand 1.6 seconds   1.6             Nutrition Therapy Plan and Nutrition Goals:  Nutrition Therapy & Goals - 11/22/21 0907       Nutrition Therapy   Diet Heart Healthy Diet    Drug/Food Interactions Statins/Certain Fruits      Personal Nutrition Goals   Nutrition Goal Patient to implement lean protein/plant protein, fruits, vegetables, whole grains, and low fat dairy as part of heart healthy meal plan.    Personal Goal #2 Patient to identify and limit food sources of saturated fat, trans fats, sodium, and refined carbohydrates    Personal Goal #3 Patient to limit sodium to '2300mg'$  per day.      Intervention Plan   Intervention Prescribe, educate and counsel regarding individualized specific dietary modifications aiming  towards  targeted core components such as weight, hypertension, lipid management, diabetes, heart failure and other comorbidities.;Nutrition handout(s) given to patient.    Expected Outcomes Long Term Goal: Adherence to prescribed nutrition plan.;Short Term Goal: Understand basic principles of dietary content, such as calories, fat, sodium, cholesterol and nutrients.;Short Term Goal: A plan has been developed with personal nutrition goals set during dietitian appointment.             Nutrition Assessments:  Nutrition Assessments - 11/22/21 0912       Rate Your Plate Scores   Pre Score 53            MEDIFICTS Score Key: ?70 Need to make dietary changes  40-70 Heart Healthy Diet ? 40 Therapeutic Level Cholesterol Diet  Flowsheet Row CARDIAC REHAB PHASE II EXERCISE from 11/21/2021 in [facility]  Picture Your Plate Total Score on Admission 53      Picture Your Plate Scores: <64 Unhealthy dietary pattern with much room for improvement. 41-50 Dietary pattern unlikely to meet recommendations for good health and room for improvement. 51-60 More healthful dietary pattern, with some room for improvement.  >60 Healthy dietary pattern, although there may be some specific behaviors that could be improved.    Nutrition Goals Re-Evaluation:   Nutrition Goals Discharge (Final Nutrition Goals Re-Evaluation):   Psychosocial: Target Goals: Acknowledge presence or absence of significant depression and/or stress, maximize coping skills, provide positive support system. Participant is able to verbalize types and ability to use techniques and skills needed for reducing stress and depression.  Initial Review & Psychosocial Screening:  Initial Psych Review & Screening - 11/17/21 1512       Initial Review   Current issues with Current Stress Concerns    Source of Stress Concerns Family    Comments Tiffany Odonnell's grandaughter just received a bone marrow biopsy. The whole  family has been assisting with her recovery caring for her small great grandchildren      Pleasants? Yes   Tiffany Odonnell has her daughter for support who she lives with   Comments Will review quality of life in the upcoming week and offer support as needed      Barriers   Psychosocial barriers to participate in program The patient should benefit from training in stress management and relaxation.      Screening Interventions   Interventions To provide support and resources with identified psychosocial needs;Encouraged to exercise;Provide feedback about the scores to participant    Expected Outcomes Long Term Goal: Stressors or current issues are controlled or eliminated.;Short Term goal: Identification and review with participant of any Quality of Life or Depression concerns found by scoring the questionnaire.;Long Term goal: The participant improves quality of Life and PHQ9 Scores as seen by post scores and/or verbalization of changes             Quality of Life Scores:  Quality of Life - 11/17/21 1241       Quality of Life   Select Quality of Life      Quality of Life Scores   Health/Function Pre 18.15 %    Socioeconomic Pre 21.5 %    Psych/Spiritual Pre 23.57 %    Family Pre 19.5 %    GLOBAL Pre 20.31 %            Scores of 19 and below usually indicate a poorer quality of life in these areas.  A difference of  2-3  points is a clinically meaningful difference.  A difference of 2-3 points in the total score of the Quality of Life Index has been associated with significant improvement in overall quality of life, self-image, physical symptoms, and general health in studies assessing change in quality of life.  PHQ-9: Review Flowsheet        11/17/2021  Depression screen PHQ 2/9  Decreased Interest 0  Down, Depressed, Hopeless 0  PHQ - 2 Score 0         Interpretation of Total Score  Total Score Depression Severity:  1-4 = Minimal depression,  5-9 = Mild depression, 10-14 = Moderate depression, 15-19 = Moderately severe depression, 20-27 = Severe depression   Psychosocial Evaluation and Intervention:   Psychosocial Re-Evaluation:  Psychosocial Re-Evaluation     [place] Name 11/22/21 0954 12/07/21 1722           Psychosocial Re-Evaluation   Current issues with Current Stress Concerns Current Stress Concerns      Comments Will review quality of life questionnaire with Tiffany Odonnell this week Reviewed quality of life questionnaire with the patient. Tiffany Odonnell reports feeling better since participaitng in cardiac rehab. Denies any depression      Expected Outcomes Tiffany Odonnell will have decreased stress upon completion of phase 2 cardiac rehab Tiffany Odonnell will have decreased stress upon completion of phase 2 cardiac rehab      Interventions Stress management education;Encouraged to attend Cardiac Rehabilitation for the exercise Stress management education;Encouraged to attend Cardiac Rehabilitation for the exercise      Continue Psychosocial Services  Follow up required by staff Follow up required by staff        Initial Review   Source of Stress Concerns Family Family      Comments Will continue to monitor and offer support as needed Will continue to monitor and offer support as needed               Psychosocial Discharge (Final Psychosocial Re-Evaluation):  Psychosocial Re-Evaluation - 12/07/21 1722       Psychosocial Re-Evaluation   Current issues with Current Stress Concerns    Comments Reviewed quality of life questionnaire with the patient. Tiffany Odonnell reports feeling better since participaitng in cardiac rehab. Denies any depression    Expected Outcomes Tiffany Odonnell will have decreased stress upon completion of phase 2 cardiac rehab    Interventions Stress management education;Encouraged to attend Cardiac Rehabilitation for the exercise    Continue Psychosocial Services  Follow up required by staff      Initial Review   Source of Stress Concerns Family     Comments Will continue to monitor and offer support as needed             Vocational Rehabilitation: Provide vocational rehab assistance to qualifying candidates.   Vocational Rehab Evaluation & Intervention:  Vocational Rehab - 11/17/21 1516       Initial Vocational Rehab Evaluation & Intervention   Assessment shows need for Vocational Rehabilitation No   Tiffany Odonnell is retired and does not need vocatinal rehab at this time            Education: Education Goals: Education classes will be provided on a weekly basis, covering required topics. Participant will state understanding/return demonstration of topics presented.  Learning Barriers/Preferences:  Learning Barriers/Preferences - 11/17/21 1300       Learning Barriers/Preferences   Learning Barriers Exercise Concerns    Learning Preferences Computer/Internet;Pictoral;Written Material             Education  Topics: Hypertension, Hypertension Reduction -Define heart disease and high blood pressure. Discus how high blood pressure affects the body and ways to reduce high blood pressure.   Exercise and Your Heart -Discuss why it is important to exercise, the FITT principles of exercise, normal and abnormal responses to exercise, and how to exercise safely.   Angina -Discuss definition of angina, causes of angina, treatment of angina, and how to decrease risk of having angina.   Cardiac Medications -Review what the following cardiac medications are used for, how they affect the body, and side effects that may occur when taking the medications.  Medications include Aspirin, Beta blockers, calcium channel blockers, ACE Inhibitors, angiotensin receptor blockers, diuretics, digoxin, and antihyperlipidemics.   Congestive Heart Failure -Discuss the definition of CHF, how to live with CHF, the signs and symptoms of CHF, and how keep track of weight and sodium intake.   Heart Disease and Intimacy -Discus the effect  sexual activity has on the heart, how changes occur during intimacy as we age, and safety during sexual activity.   Smoking Cessation / COPD -Discuss different methods to quit smoking, the health benefits of quitting smoking, and the definition of COPD.   Nutrition I: Fats -Discuss the types of cholesterol, what cholesterol does to the heart, and how cholesterol levels can be controlled.   Nutrition II: Labels -Discuss the different components of food labels and how to read food label   Heart Parts/Heart Disease and PAD -Discuss the anatomy of the heart, the pathway of blood circulation through the heart, and these are affected by heart disease.   Stress I: Signs and Symptoms -Discuss the causes of stress, how stress may lead to anxiety and depression, and ways to limit stress.   Stress II: Relaxation -Discuss different types of relaxation techniques to limit stress.   Warning Signs of Stroke / TIA -Discuss definition of a stroke, what the signs and symptoms are of a stroke, and how to identify when someone is having stroke.   Knowledge Questionnaire Score:  Knowledge Questionnaire Score - 11/17/21 1301       Knowledge Questionnaire Score   Pre Score 19/24             Core Components/Risk Factors/Patient Goals at Admission:  Personal Goals and Risk Factors at Admission - 11/17/21 1305       Core Components/Risk Factors/Patient Goals on Admission    Weight Management Yes;Obesity;Weight Loss    Intervention Weight Management: Develop a combined nutrition and exercise program designed to reach desired caloric intake, while maintaining appropriate intake of nutrient and fiber, sodium and fats, and appropriate energy expenditure required for the weight goal.;Weight Management: Provide education and appropriate resources to help participant work on and attain dietary goals.;Weight Management/Obesity: Establish reasonable short term and long term weight goals.;Obesity:  Provide education and appropriate resources to help participant work on and attain dietary goals.    Admit Weight 155 lb 6.8 oz (70.5 kg)    Expected Outcomes Short Term: Continue to assess and modify interventions until short term weight is achieved;Long Term: Adherence to nutrition and physical activity/exercise program aimed toward attainment of established weight goal;Weight Loss: Understanding of general recommendations for a balanced deficit meal plan, which promotes 1-2 lb weight loss per week and includes a negative energy balance of [phone] kcal/d;Understanding recommendations for meals to include 15-35% energy as protein, 25-35% energy from fat, 35-60% energy from carbohydrates, less than 248m of dietary cholesterol, 20-35 gm of total fiber daily;Understanding of distribution  of calorie intake throughout the day with the consumption of 4-5 meals/snacks    Diabetes Yes    Intervention Provide education about signs/symptoms and action to take for hypo/hyperglycemia.;Provide education about proper nutrition, including hydration, and aerobic/resistive exercise prescription along with prescribed medications to achieve blood glucose in normal ranges: Fasting glucose 65-99 mg/dL    Expected Outcomes Short Term: Participant verbalizes understanding of the signs/symptoms and immediate care of hyper/hypoglycemia, proper foot care and importance of medication, aerobic/resistive exercise and nutrition plan for blood glucose control.;Long Term: Attainment of HbA1C < 7%.    Hypertension Yes    Intervention Provide education on lifestyle modifcations including regular physical activity/exercise, weight management, moderate sodium restriction and increased consumption of fresh fruit, vegetables, and low fat dairy, alcohol moderation, and smoking cessation.;Monitor prescription use compliance.    Expected Outcomes Short Term: Continued assessment and intervention until BP is < 140/62m HG in hypertensive  participants. < 130/839mHG in hypertensive participants with diabetes, heart failure or chronic kidney disease.;Long Term: Maintenance of blood pressure at goal levels.    Lipids Yes    Intervention Provide education and support for participant on nutrition & aerobic/resistive exercise along with prescribed medications to achieve LDL <7091mHDL >35m81m  Expected Outcomes Short Term: Participant states understanding of desired cholesterol values and is compliant with medications prescribed. Participant is following exercise prescription and nutrition guidelines.;Long Term: Cholesterol controlled with medications as prescribed, with individualized exercise RX and with personalized nutrition plan. Value goals: LDL < 70mg57mL > 40 mg.    Stress Yes    Intervention Offer individual and/or small group education and counseling on adjustment to heart disease, stress management and health-related lifestyle change. Teach and support self-help strategies.;Refer participants experiencing significant psychosocial distress to appropriate mental health specialists for further evaluation and treatment. When possible, include family members and significant others in education/counseling sessions.    Expected Outcomes Short Term: Participant demonstrates changes in health-related behavior, relaxation and other stress management skills, ability to obtain effective social support, and compliance with psychotropic medications if prescribed.;Long Term: Emotional wellbeing is indicated by absence of clinically significant psychosocial distress or social isolation.    Personal Goal Other Yes    Personal Goal Long and short term: strength, wt loss and balance    Intervention Will continue to monitor pt and progress workloads as tolerated without dign or symptom    Expected Outcomes Pt will achieve her goals             Core Components/Risk Factors/Patient Goals Review:   Goals and Risk Factor Review     Row Name  11/22/21 0957 12/07/21 1724           Core Components/Risk Factors/Patient Goals Review   Personal Goals Review Weight Management/Obesity;Stress;Lipids;Diabetes;Hypertension Weight Management/Obesity;Stress;Lipids;Diabetes;Hypertension      Review LydiaCoriannted cardiac rehab on 11/21/21. Tiffany Odonnell with exercise.  Vital signs and CBG's were stable. Tiffany Odonnell diet controlled LydiaAtheniaoing  well with exercise.  Vital signs have been stable. Tiffany Odonnell feeling stronger and has more energy.      Expected Outcomes LydiaLauralee continue to particpate in phase 2 cardiac rehab for exercise, nutrition and lifestyle modifications Tiffany Odonnell continue to particpate in phase 2 cardiac rehab for exercise, nutrition and lifestyle modifications               Core Components/Risk Factors/Patient Goals at Discharge (Final Review):   Goals and Risk Factor Review - 12/07/21 1724  Core Components/Risk Factors/Patient Goals Review   Personal Goals Review Weight Management/Obesity;Stress;Lipids;Diabetes;Hypertension    Review Tiffany Odonnell is doing  well with exercise.  Vital signs have been stable. Tiffany Odonnell reports feeling stronger and has more energy.    Expected Outcomes Tiffany Odonnell will continue to particpate in phase 2 cardiac rehab for exercise, nutrition and lifestyle modifications             ITP Comments:  ITP Comments     Row Name 11/17/21 1508 11/22/21 0952 12/07/21 1721       ITP Comments Dr Fransico Him MD, Medical Director 30 Day ITP Review. Esly started exercise at cardiac rehab on 11/21/21 and did well with exercise. 30 Day ITP Review. Edwinna has good attendance and participation in phase 2 cardiac rehab. Nyara is off to a good start to exercise              Comments: See ITP comments.Harrell Gave RN BSN

## 2021-12-09 ENCOUNTER — Telehealth (HOSPITAL_COMMUNITY): Payer: Self-pay | Admitting: Endocrinology

## 2021-12-09 ENCOUNTER — Encounter (HOSPITAL_COMMUNITY): Payer: Medicare Other

## 2021-12-12 ENCOUNTER — Encounter (HOSPITAL_COMMUNITY)
Admission: RE | Admit: 2021-12-12 | Discharge: 2021-12-12 | Disposition: A | Discharge status: Home / Self Care | Payer: Medicare Other | Source: Ambulatory Visit | Attending: Internal Medicine | Admitting: Internal Medicine

## 2021-12-12 DIAGNOSIS — I214 Non-ST elevation (NSTEMI) myocardial infarction: Secondary | ICD-10-CM | POA: Diagnosis not present

## 2021-12-14 ENCOUNTER — Encounter (HOSPITAL_COMMUNITY)
Admission: RE | Admit: 2021-12-14 | Discharge: 2021-12-14 | Disposition: A | Discharge status: Home / Self Care | Payer: Medicare Other | Source: Ambulatory Visit | Attending: Internal Medicine | Admitting: Internal Medicine

## 2021-12-14 DIAGNOSIS — I214 Non-ST elevation (NSTEMI) myocardial infarction: Secondary | ICD-10-CM

## 2021-12-14 DIAGNOSIS — Z951 Presence of aortocoronary bypass graft: Secondary | ICD-10-CM

## 2021-12-16 ENCOUNTER — Encounter (HOSPITAL_COMMUNITY): Payer: Medicare Other

## 2021-12-19 ENCOUNTER — Encounter (HOSPITAL_COMMUNITY): Payer: Medicare Other

## 2021-12-20 ENCOUNTER — Ambulatory Visit (HOSPITAL_BASED_OUTPATIENT_CLINIC_OR_DEPARTMENT_OTHER): Payer: Medicare Other | Admitting: Family

## 2021-12-20 NOTE — Progress Notes (Deleted)
Office Visit    Patient Name: Tiffany Odonnell Date of Encounter: 12/20/2021  PCP:  Felix Pacini, FNP   [facility]  Cardiologist:  Chrystie Nose, MD  Advanced Practice Provider:  No care team member to display Electrophysiologist:  None      Chief Complaint    Tiffany Odonnell is a 71 y.o. female with a hx of CAD s/p CABG, HTN, HLD, fibromyalgia, DMII, Sjogren's presents today for bradycardia   Past Medical History    Past Medical History:  Diagnosis Date   Anxiety    Blood in urine    Cervical disc disorder    bulging disc   Coronary artery disease    Diabetes mellitus without complication (HCC)    Dizziness    Falls    Fibromyalgia    H/O Salmonella gastroenteritis    High cholesterol    History of frequent urinary tract infections    History of hiatal hernia    History of vertigo    Hypertension    Imbalance    Kidney stones    Lichen planopilaris    Lumbar herniated disc    L5-L6   Numbness and tingling    hands and feet bilat    Oscillopsia    Peripheral neuropathy    Septicemia (HCC)    2013   Sinusitis    Sjogren's disease (HCC)    Tinnitus    Wears glasses    Past Surgical History:  Procedure Laterality Date   ABDOMINAL HYSTERECTOMY     2014   CARDIAC CATHETERIZATION     CORONARY ARTERY BYPASS GRAFT N/A 08/05/2021   Procedure: CORONARY ARTERY BYPASS GRAFTING (CABG) TIMES THREE, USING LEFT INTERNAL MAMMARTY ARTERY AND ENDOSCOPICALLY HARVESTED RIGHT GREATER SAPHENOUS VEIN;  Surgeon: Alleen Borne, MD;  Location: [facility] OR;  Service: Open Heart Surgery;  Laterality: N/A;   ENDOVEIN HARVEST OF GREATER SAPHENOUS VEIN Right 08/05/2021   Procedure: ENDOVEIN HARVEST OF GREATER SAPHENOUS VEIN;  Surgeon: Alleen Borne, MD;  Location: [facility] OR;  Service: Open Heart Surgery;  Laterality: Right;   IR URETERAL STENT RIGHT NEW ACCESS W/O SEP NEPHROSTOMY CATH  07/15/2021   KNEE ARTHROSCOPY WITH MEDIAL MENISECTOMY Left 10/29/2019    Procedure: KNEE ARTHROSCOPY WITH MEDIAL AND LATERAL MENISECTOMY WITH DEBRIDEMENT;  Surgeon: Jene Every, MD;  Location: [facility] ORS;  Service: Orthopedics;  Laterality: Left;  60 mins   LEFT HEART CATH AND CORONARY ANGIOGRAPHY N/A 08/02/2021   Procedure: LEFT HEART CATH AND CORONARY ANGIOGRAPHY;  Surgeon: Corky Crafts, MD;  Location: [facility] INVASIVE CV LAB;  Service: Cardiovascular;  Laterality: N/A;   NASAL SINUS SURGERY     NEPHROLITHOTOMY Right 05/31/2015   Procedure: RIGHT PERCUTANEOUS NEPHROLITHOTOMY  ;  Surgeon: Ihor Gully, MD;  Location: [facility] ORS;  Service: Urology;  Laterality: Right;   NEPHROLITHOTOMY Right 07/15/2021   Procedure: NEPHROLITHOTOMY PERCUTANEOUS;  Surgeon: Jannifer Hick, MD;  Location: [facility] ORS;  Service: Urology;  Laterality: Right;   prolapsed bladder and rectocele surgery      TEE WITHOUT CARDIOVERSION N/A 08/05/2021   Procedure: TRANSESOPHAGEAL ECHOCARDIOGRAM (TEE);  Surgeon: Alleen Borne, MD;  Location: [facility] OR;  Service: Open Heart Surgery;  Laterality: N/A;   TUBAL LIGATION     1983   URETERAL STENT PLACEMENT     times 2 secondary to kidney stone     Allergies  Allergies  Allergen Reactions   Augmentin [Amoxicillin-Pot Clavulanate] Nausea And Vomiting   Ciprofloxacin Hives  Sulfa Antibiotics Other (See Comments)    Trembling, paralyzing pain, red eyes, skin crawling   Latex Itching   Macrolides And Ketolides Diarrhea and Nausea And Vomiting   Tetracyclines & Related Other (See Comments) and Tinitus    Vertigo    History of Present Illness    Tiffany Odonnell is a 71 y.o. female with a hx of CAD s/p CABG, HTN, HLD, fibromyalgia, DMII, Sjogren's last seen 09/19/21.  Presented to the ED 08/01/21 with chest pain. HS toponin 73 ? 433. Echo 08/02/21 LVEF 60-65%, gr1DD. Cardiac catheterization 08/03/21 with pLAD 75% stenosed, prox-mid LAD 100% stenosis, right to left collaterals, 1st diagonal 95% stenosed. On 08/05/21 underwent CABG x3. She developed postoperative  atrial fibrillation and converted with Amiodarone.   Seen 08/31/21 by cardiothoracic surgeon doing well. Plavix discontinued due to nosebleed and Aspirin continued. Amiodarone decreased to 200mg  daily.   Seen in cardiology clinic 09/19/21 doing well, walking 1-2 blocks without dyspnea. Still with some shoulder and neck pain. No changes were made at that time.  Contacted the office 10/24/21 noting heart rate 49 bpm with feeling faint, lightheaded. Bp at that time 130/78. A few minutes later BP 143/77 and HR 60 bpm. At clinic visit 10/27/21 Amiodarone discontinued, Atenolol reduced due to bradycardia, 14 day monitor placed. Monitor revealed predominantly NSR 55 bpm with rare PVC/APC. Low heart rates at nighttime but no significant pause.   She presents today for follow up with her daughter. Pleasant lady who is originally from the [place]. Tells me two days ago symptoms started. She felt very jittery. That morning she had gotten up to go for a walk. During her walk felt she wasn't focusing well. Tells me she felt "off" described as nervous, lightheaded. She has not had dizziness or sensation of room spinning. She feels more lightheaded as if she needs to sit down. Tells me her balance feels affected - it was not good prior to surgery but feels worse since surgery. Notes her tinnitus has also increased since her surgery.  EKGs/Labs/Other Studies Reviewed:   The following studies were reviewed today: ZIO 2021-11-04   Patch Wear Time:  13 days and 1 hours (2023-04-27T09:30:42-0400 to 2023-05-10T11:06:34-0400)   Patient had a min HR of 38 bpm, max HR of 95 bpm, and avg HR of 55 bpm. Predominant underlying rhythm was Sinus Rhythm. Isolated SVEs were rare (<1.0%), and no SVE Couplets or SVE Triplets were present. Isolated VEs were rare (<1.0%), and no VE Couplets  or VE Triplets were present.    Essentially normal monitor. Few PAC's and PVCs. HR in the 30's at night - one patient triggered event for  dizziness, showed sinus bradycardia in the 50s.    LHC 08/02/21   Prox LAD lesion is 75% stenosed.   Prox LAD to Mid LAD lesion is 100% stenosed.  Right to left collaterals fill the mid to distal LAD.   1st Diag lesion is 95% stenosed.   Prox RCA lesion is 40% stenosed.   Mid RCA lesion is 25% stenosed.   The left ventricular systolic function is normal.   LV end diastolic pressure is normal.   The left ventricular ejection fraction is 55-65% by visual estimate.   There is no aortic valve stenosis.   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     Plan for cardiac surgery consult  Doppler 08/03/21  Summary:  Right Carotid: The extracranial vessels were near-normal with only minimal  wall                thickening or plaque.   Left Carotid: The extracranial vessels were near-normal with only minimal  wall               thickening or plaque.  Vertebrals:  Bilateral vertebral arteries demonstrate antegrade flow.  Subclavians: Normal flow hemodynamics were seen in bilateral subclavian               arteries.   Echo 08/02/21  1. Left ventricular ejection fraction, by estimation, is 60 to 65%. The  left ventricle has normal function. The left ventricle has no regional  wall motion abnormalities. Left ventricular diastolic parameters are  consistent with Grade I diastolic  dysfunction (impaired relaxation).   2. Right ventricular systolic function is normal. The right ventricular  size is normal. There is normal pulmonary artery systolic pressure.   3. The mitral valve is normal in structure. Mild mitral valve  regurgitation. No evidence of mitral stenosis.   4. The aortic valve is tricuspid. There is mild calcification of the  aortic valve. Aortic valve regurgitation is not visualized. Aortic valve  sclerosis is present, with no evidence of aortic valve stenosis.   5. The inferior vena cava is normal in size with greater than 50%   respiratory variability, suggesting right atrial pressure of 3 mmHg.   EKG:  EKG is ordered today.  The ekg ordered today demonstrates SB 50 bpm with no acute ST/T wave changes.  Recent Labs: 08/01/2021: TSH 0.871 08/02/2021: ALT 21 09/19/2021: Hemoglobin 13.6; Platelets 245 10/27/2021: BUN 24; Creatinine, Ser 1.45; Magnesium 2.0; Potassium 5.0; Sodium 139  Recent Lipid Panel    Component Value Date/Time   CHOL 222 (H) 08/02/2021 0400   TRIG 98 08/02/2021 0400   HDL 56 08/02/2021 0400   CHOLHDL 4.0 08/02/2021 0400   VLDL 20 08/02/2021 0400   LDLCALC 146 (H) 08/02/2021 0400     Home Medications   No outpatient medications have been marked as taking for the 12/20/21 encounter (Appointment) with Loel Dubonnet, NP.     Review of Systems      All other systems reviewed and are otherwise negative except as noted above.  Physical Exam    VS:  There were no vitals taken for this visit. , BMI There is no height or weight on file to calculate BMI.  Wt Readings from Last 3 Encounters:  11/17/21 155 lb 4.8 oz (70.4 kg)  10/27/21 153 lb (69.4 kg)  09/19/21 155 lb 6.4 oz (70.5 kg)    GEN: Well nourished, well developed, in no acute distress. HEENT: normal. Neck: Supple, no JVD, carotid bruits, or masses. Cardiac: bradycardic, RRR, no murmurs, rubs, or gallops. No clubbing, cyanosis, edema.  Radials/PT 2+ and equal bilaterally.  Respiratory:  Respirations regular and unlabored, clear to auscultation bilaterally. GI: Soft, nontender, nondistended. MS: No deformity or atrophy. Skin: Warm and dry, no rash. Neuro:  Strength and sensation are intact. Psych: Normal affect.  Assessment & Plan    Postoperative atrial fibrillation / Near Syncope / Bradycardia -had postoperative atrial fibrillation at the time of her CABG 08/05/2021 and was discharged on amiodarone.  Subsequently reduced to 200 mg daily.  She notes 3-day history of bradycardia with heart rate in the 40s and 50s associated  with lightheadedness, near syncope.  She has held her amiodarone for the past 3 days.  We will discontinue amiodarone.  Reduce atenolol from 50 mg daily  to 25 mg daily.  14-day live monitor placed today to rule out atrial fibrillation with posttermination pauses or high-grade AV block.  BMP, mag today.***  CAD s/p CABG 08/05/21 - Stable with no anginal symptoms. No indication for ischemic evaluation. GDMT includes aspirin, Atenolol, Rosuvastatin.  Reduce atenolol to 25 mg daily due to bradycardia.  Encouraged to participate in cardiac rehab. Heart healthy diet and regular cardiovascular exercise encouraged.  ***  HLD, LDL goal <70 - Continue Crestor 20mg  QD***  HTN - BP well controlled. Continue current antihypertensive regimen.  ***  DM2 - Continue to follow with PCP. ***  Disposition: Follow up  as scheduled  with , MD in August.  Signed, 07-09-1986, NP 12/20/2021, 10:11 AM [facility]

## 2021-12-21 ENCOUNTER — Encounter (HOSPITAL_COMMUNITY)
Admission: RE | Admit: 2021-12-21 | Discharge: 2021-12-21 | Disposition: A | Discharge status: Home / Self Care | Payer: Medicare Other | Source: Ambulatory Visit | Attending: Internal Medicine | Admitting: Internal Medicine

## 2021-12-21 DIAGNOSIS — Z951 Presence of aortocoronary bypass graft: Secondary | ICD-10-CM

## 2021-12-21 DIAGNOSIS — I214 Non-ST elevation (NSTEMI) myocardial infarction: Secondary | ICD-10-CM | POA: Diagnosis not present

## 2021-12-21 NOTE — Progress Notes (Signed)
CARDIAC REHAB PHASE 2  Reviewed home exercise with pt today. Pt is tolerating exercise well. Pt will continue to exercise on her own by walking for 30 minutes per session 2-4 days a week in addition to the 3 days in CRP2 (she has a treadmill, but it is not set up yet. She will add that in at a later time). Advised pt on THRR, RPE scale, hydration and temperature/humidity precautions. Reinforced S/S to stop exercise and when to call MD vs 911. Encouraged warm up cool down and stretches with exercise sessions. Pt verbalized understanding, all questions were answered and pt was given a copy to take home.    Harrie Jeans ACSM-CEP 12/21/2021 4:44 PM

## 2021-12-23 ENCOUNTER — Encounter (HOSPITAL_COMMUNITY)
Admission: RE | Admit: 2021-12-23 | Discharge: 2021-12-23 | Disposition: A | Discharge status: Home / Self Care | Payer: Medicare Other | Source: Ambulatory Visit | Attending: Internal Medicine | Admitting: Internal Medicine

## 2021-12-23 DIAGNOSIS — Z951 Presence of aortocoronary bypass graft: Secondary | ICD-10-CM

## 2021-12-23 DIAGNOSIS — I214 Non-ST elevation (NSTEMI) myocardial infarction: Secondary | ICD-10-CM

## 2021-12-26 ENCOUNTER — Encounter (HOSPITAL_COMMUNITY)
Admission: RE | Admit: 2021-12-26 | Discharge: 2021-12-26 | Disposition: A | Discharge status: Home / Self Care | Payer: Medicare Other | Source: Ambulatory Visit | Attending: Internal Medicine | Admitting: Internal Medicine

## 2021-12-26 DIAGNOSIS — I214 Non-ST elevation (NSTEMI) myocardial infarction: Secondary | ICD-10-CM | POA: Diagnosis not present

## 2021-12-26 DIAGNOSIS — Z951 Presence of aortocoronary bypass graft: Secondary | ICD-10-CM

## 2021-12-28 ENCOUNTER — Encounter (HOSPITAL_COMMUNITY)
Admission: RE | Admit: 2021-12-28 | Discharge: 2021-12-28 | Disposition: A | Discharge status: Home / Self Care | Payer: Medicare Other | Source: Ambulatory Visit | Attending: Internal Medicine | Admitting: Internal Medicine

## 2021-12-28 DIAGNOSIS — I214 Non-ST elevation (NSTEMI) myocardial infarction: Secondary | ICD-10-CM | POA: Diagnosis not present

## 2021-12-28 DIAGNOSIS — Z951 Presence of aortocoronary bypass graft: Secondary | ICD-10-CM

## 2021-12-29 NOTE — Progress Notes (Signed)
Cardiac Individual Treatment Plan  Patient Details  Name: Tiffany Odonnell MRN: [id number] Date of Birth: Jun 12, 1951 Referring Provider:   Flowsheet Row CARDIAC REHAB PHASE II ORIENTATION from 11/17/2021 in [facility]  Referring Provider Dr. 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  Patient's Home Medications on Admission:  Current Outpatient Medications:    acetaminophen (TYLENOL) 500 MG tablet, Take 1,000 mg by mouth every 6 (six) hours as needed for headache (pain)., Disp: , Rfl:    Ascorbic Acid (VITAMIN C) 1000 MG tablet, Take 2,000 mg by mouth daily as needed (immune support)., Disp: , Rfl:    aspirin EC 81 MG tablet, Take 1 tablet (81 mg total) by mouth daily., Disp: 14 tablet, Rfl: 1   atenolol (TENORMIN) 25 MG tablet, Take 1 tablet (25 mg total) by mouth daily., Disp: 90 tablet, Rfl: 3   betamethasone dipropionate 0.05 % lotion, Apply 1 application topically 2 (two) times daily as needed (scalp rash)., Disp: , Rfl: 2   Carboxymethylcellulose Sodium (THERATEARS OP), Place 1 drop into both eyes 2 (two) times daily as needed (dry eyes)., Disp: , Rfl:    docusate sodium (COLACE) 100 MG capsule, Take 1 capsule (100 mg total) by mouth daily as needed for up to 30 doses. (Patient taking differently: Take 100 mg by mouth daily as needed (constipation).), Disp: 30 capsule, Rfl: 0   ECHINACEA PO, Take 2 capsules by mouth daily as needed (immune system boost)., Disp: , Rfl:    hydrocortisone (ANUSOL-HC) 2.5 % rectal cream, Place 1 application rectally 3 (three) times daily as needed for hemorrhoids., Disp: , Rfl:    indapamide (LOZOL) 2.5 MG tablet, Take 2.5 mg by mouth every morning., Disp: , Rfl:    MAGNESIUM PO, Take 1 tablet by mouth at bedtime as needed (relax  nervew)., Disp: , Rfl:    Multiple Vitamins-Minerals (ZINC PO), Take 1 tablet by mouth at bedtime as needed (to relax nerves)., Disp: , Rfl:    oxyCODONE (OXY IR/ROXICODONE) 5 MG immediate release tablet, Take 1 tablet (5 mg total) by mouth every 6 (six) hours as needed for severe pain. (Patient not taking: Reported on 11/16/2021), Disp: 28 tablet, Rfl: 0   Potassium Citrate 15 MEQ (1620 MG) TBCR, Take 15 mEq by mouth in the morning and at bedtime., Disp: , Rfl:    rosuvastatin (CRESTOR) 20 MG tablet, TAKE 1 TABLET EVERY DAY, Disp: 30 tablet, Rfl: 10  Past Medical History: Past Medical History:  Diagnosis Date   Anxiety    Blood in urine    Cervical disc disorder    bulging disc   Coronary artery disease    Diabetes mellitus without complication (HCC)    Dizziness    Falls    Fibromyalgia    H/O Salmonella gastroenteritis    High cholesterol    History of frequent urinary tract infections    History of hiatal hernia    History of vertigo    Hypertension    Imbalance    Kidney stones    Lichen planopilaris    Lumbar herniated disc    L5-L6   Numbness and tingling    hands and feet bilat    Oscillopsia    Peripheral neuropathy  Septicemia ([place])    2013   Sinusitis    Sjogren's disease ([place])    Tinnitus    Wears glasses     Tobacco Use: Social History   Tobacco Use  Smoking Status Never  Smokeless Tobacco Never    Labs: Review Flowsheet       Latest Ref Rng & Units 06/22/2021 08/01/2021 08/02/2021 08/05/2021  Labs for ITP Cardiac and Pulmonary Rehab  Cholestrol 0 - 200 mg/dL - - 222  -  LDL (calc) 0 - 99 mg/dL - - 146  -  HDL-C >40 mg/dL - - 56  -  Trlycerides <150 mg/dL - - 98  -  Hemoglobin A1c 4.8 - 5.6 % 6.0  5.9  - -  PH, Arterial 7.350 - 7.450 - - - 7.392  7.366  7.378  7.416  7.252  7.406  7.501  7.443  7.368  7.411   PCO2 arterial 32.0 - 48.0 mmHg - - - 38.2  40.4  40.2  36.2  47.7  40.8  30.8  36.9  40.8  36.2   Bicarbonate 20.0 - 28.0 mmol/L - - -  23.3  23.2  23.7  23.5  21.5  25.6  24.1  25.3  23.5  22.5   TCO2 22 - 32 mmol/L - - - $R'24  24  25  25  23  24  27  25  25  26  26  25  25   'aC$ Acid-base deficit 0.0 - 2.0 mmol/L - - - 2.0  2.0  1.0  1.0  6.0  2.0  1.4   O2 Saturation % - - - 99.0  99.0  99.0  99.0  93.0  100.0  100.0  100.0  100.0  95.8     Capillary Blood Glucose: Lab Results  Component Value Date   GLUCAP 96 11/21/2021   GLUCAP 94 11/21/2021   GLUCAP 130 (H) 08/10/2021   GLUCAP 127 (H) 08/09/2021   GLUCAP 110 (H) 08/09/2021     Exercise Target Goals: Exercise Program Goal: Individual exercise prescription set using results from initial 6 min walk test and THRR while considering  patient's activity barriers and safety.   Exercise Prescription Goal: Initial exercise prescription builds to 30-45 minutes a day of aerobic activity, 2-3 days per week.  Home exercise guidelines will be given to patient during program as part of exercise prescription that the participant will acknowledge.  Activity Barriers & Risk Stratification:  Activity Barriers & Cardiac Risk Stratification - 11/17/21 1254       Activity Barriers & Cardiac Risk Stratification   Activity Barriers Joint Problems;Balance Concerns;Deconditioning;History of Falls;Assistive Device;Incisional Pain    Cardiac Risk Stratification High             6 Minute Walk:  6 Minute Walk     Row Name 11/17/21 1249         6 Minute Walk   Phase Initial     Distance 1241 feet     Walk Time 6 minutes     # of Rest Breaks 0     MPH 2.35     METS 2.07     RPE 10     Perceived Dyspnea  0     VO2 Peak 7.26     Symptoms No     Resting HR 56 bpm     Resting BP 122/70     Resting Oxygen Saturation  99 %     Exercise Oxygen Saturation  during 6 min walk 96 %     Max Ex. HR 71 bpm     Max Ex. BP 122/70     2 Minute Post BP 126/74              Oxygen Initial Assessment:   Oxygen Re-Evaluation:   Oxygen Discharge (Final Oxygen  Re-Evaluation):   Initial Exercise Prescription:  Initial Exercise Prescription - 11/17/21 1200       Date of Initial Exercise RX and Referring Provider   Date 11/17/21    Referring Provider Dr. Lyman Bishop MD    Expected Discharge Date 01/13/22      NuStep   Level 1    SPM 80    Minutes 30    METs 1.7      Prescription Details   Frequency (times per week) 3    Duration Progress to 30 minutes of continuous aerobic without signs/symptoms of physical distress      Intensity   THRR 40-80% of Max Heartrate 60-120    Ratings of Perceived Exertion 11-13    Perceived Dyspnea 0-4      Progression   Progression Continue progressive overload as per policy without signs/symptoms or physical distress.      Resistance Training   Training Prescription Yes    Weight 2    Reps 10-15             Perform Capillary Blood Glucose checks as needed.  Exercise Prescription Changes:   Exercise Prescription Changes     Row Name 11/21/21 1507 12/21/21 1600           Response to Exercise   Blood Pressure (Admit) 118/72 130/74      Blood Pressure (Exercise) 146/86 136/70      Blood Pressure (Exit) 118/74 110/60      Heart Rate (Admit) 56 bpm 59 bpm      Heart Rate (Exercise) 70 bpm 101 bpm      Heart Rate (Exit) 55 bpm 67 bpm      Rating of Perceived Exertion (Exercise) 9 11      Symptoms None none      Comments Off to a good start with exercise. Increased WL on NuStep from 1 to 2. Reviewed MET's, goals and home ExR      Duration Continue with 30 min of aerobic exercise without signs/symptoms of physical distress. Continue with 30 min of aerobic exercise without signs/symptoms of physical distress.      Intensity THRR unchanged THRR unchanged        Progression   Progression Continue to progress workloads to maintain intensity without signs/symptoms of physical distress. Continue to progress workloads to maintain intensity without signs/symptoms of physical distress.       Average METs 2 3.9        Resistance Training   Training Prescription No No        Interval Training   Interval Training No --        NuStep   Level 2 3      SPM 72 100      Minutes 30 30      METs 2 3.9        Home Exercise Plan   Plans to continue exercise at -- Home (comment)      Frequency -- Add 3 additional days to program exercise sessions.      Initial Home Exercises Provided -- 12/21/21  Exercise Comments:   Exercise Comments     Row Name 11/21/21 1559 12/21/21 1643         Exercise Comments Paitent tolerated 1st session of exericse well without symptoms. Reviewed MET's, goals and home ExRx. Pt tolerated exercise well with an average MET level of 3.9. Pt will continue to exercise by walking 30 mins 2-4 days when she get her treadmill set up she will use that as well. Pt feels good about her goals of gaining strength and feels better about her balance. Pt states she has had issues with her balance since her teens.               Exercise Goals and Review:   Exercise Goals     Row Name 11/17/21 1257             Exercise Goals   Increase Physical Activity Yes       Intervention Provide advice, education, support and counseling about physical activity/exercise needs.;Develop an individualized exercise prescription for aerobic and resistive training based on initial evaluation findings, risk stratification, comorbidities and participant's personal goals.       Expected Outcomes Short Term: Attend rehab on a regular basis to increase amount of physical activity.;Long Term: Add in home exercise to make exercise part of routine and to increase amount of physical activity.;Long Term: Exercising regularly at least 3-5 days a week.       Increase Strength and Stamina Yes       Intervention Provide advice, education, support and counseling about physical activity/exercise needs.;Develop an individualized exercise prescription for aerobic and resistive  training based on initial evaluation findings, risk stratification, comorbidities and participant's personal goals.       Expected Outcomes Short Term: Increase workloads from initial exercise prescription for resistance, speed, and METs.;Short Term: Perform resistance training exercises routinely during rehab and add in resistance training at home;Long Term: Improve cardiorespiratory fitness, muscular endurance and strength as measured by increased METs and functional capacity (6MWT)       Able to understand and use rate of perceived exertion (RPE) scale Yes       Intervention Provide education and explanation on how to use RPE scale       Expected Outcomes Short Term: Able to use RPE daily in rehab to express subjective intensity level;Long Term:  Able to use RPE to guide intensity level when exercising independently       Knowledge and understanding of Target Heart Rate Range (THRR) Yes       Intervention Provide education and explanation of THRR including how the numbers were predicted and where they are located for reference       Expected Outcomes Short Term: Able to state/look up THRR;Long Term: Able to use THRR to govern intensity when exercising independently;Short Term: Able to use daily as guideline for intensity in rehab       Understanding of Exercise Prescription Yes       Intervention Provide education, explanation, and written materials on patient's individual exercise prescription       Expected Outcomes Short Term: Able to explain program exercise prescription;Long Term: Able to explain home exercise prescription to exercise independently                Exercise Goals Re-Evaluation :  Exercise Goals Re-Evaluation     [place] Name 11/21/21 1559 12/21/21 1638           Exercise Goal Re-Evaluation   Exercise Goals Review Increase Physical  Activity;Able to understand and use rate of perceived exertion (RPE) scale Increase Physical Activity;Able to understand and use rate of  perceived exertion (RPE) scale;Knowledge and understanding of Target Heart Rate Range (THRR);Understanding of Exercise Prescription;Increase Strength and Stamina      Comments Patient able to understand and use RPE scale appropriately. Currently walking 30-45 minutes at home and asked if it was OK to walk 30-45 minutes twice daily. I suggested patient stick to once a day for now. Patiient will walk 30-45 minutes on the days she doesn't attend cardiac rehab. Reviewed MET's, goals and home ExRx. Pt tolerated exercise well with an average MET level of 3.9. Pt will continue to exercise by walking 30 mins 2-4 days when she get her treadmill set up she will use that as well. Pt feels good about her goals of gaining strength and feels better about her balance. Pt states she has had issues with her balance since her teens.      Expected Outcomes Progress workloads as tolerated to help improve cardiorespiratory fitness. Will continue to monitor pt and progress workloads as tolerated without sign or symptom. Pt will continue to exercise on her own 2-4 days               Discharge Exercise Prescription (Final Exercise Prescription Changes):  Exercise Prescription Changes - 12/21/21 1600       Response to Exercise   Blood Pressure (Admit) 130/74    Blood Pressure (Exercise) 136/70    Blood Pressure (Exit) 110/60    Heart Rate (Admit) 59 bpm    Heart Rate (Exercise) 101 bpm    Heart Rate (Exit) 67 bpm    Rating of Perceived Exertion (Exercise) 11    Symptoms none    Comments Reviewed MET's, goals and home ExR    Duration Continue with 30 min of aerobic exercise without signs/symptoms of physical distress.    Intensity THRR unchanged      Progression   Progression Continue to progress workloads to maintain intensity without signs/symptoms of physical distress.    Average METs 3.9      Resistance Training   Training Prescription No      NuStep   Level 3    SPM 100    Minutes 30    METs 3.9       Home Exercise Plan   Plans to continue exercise at Home (comment)    Frequency Add 3 additional days to program exercise sessions.    Initial Home Exercises Provided 12/21/21             Nutrition:  Target Goals: Understanding of nutrition guidelines, daily intake of sodium '1500mg'$ , cholesterol '200mg'$ , calories 30% from fat and 7% or less from saturated fats, daily to have 5 or more servings of fruits and vegetables.  Biometrics:  Pre Biometrics - 11/17/21 1242       Pre Biometrics   Waist Circumference 36 inches    Hip Circumference 42.25 inches    Waist to Hip Ratio 0.85 %    Triceps Skinfold 20 mm    % Body Fat 40.7 %    Grip Strength 19 kg    Flexibility 20.25 in    Single Leg Stand 1.6 seconds   1.6             Nutrition Therapy Plan and Nutrition Goals:  Nutrition Therapy & Goals - 12/23/21 1530       Nutrition Therapy   Diet Heart Healthy Diet  Drug/Food Interactions Statins/Certain Fruits      Personal Nutrition Goals   Nutrition Goal Patient to implement lean protein/plant protein, fruits, vegetables, whole grains, and low fat dairy as part of heart healthy meal plan.    Personal Goal #2 Patient to identify and limit food sources of saturated fat, trans fats, sodium, and refined carbohydrates    Personal Goal #3 Patient to limit sodium to '2300mg'$  per day.    Comments Patient continues to work on heart healthy diet recommendations. She remains motivated to lose weight; however, she admits to overeating carbohydrates on occasion.      Intervention Plan   Intervention Prescribe, educate and counsel regarding individualized specific dietary modifications aiming towards targeted core components such as weight, hypertension, lipid management, diabetes, heart failure and other comorbidities.    Expected Outcomes Long Term Goal: Adherence to prescribed nutrition plan.;Short Term Goal: Understand basic principles of dietary content, such as calories, fat,  sodium, cholesterol and nutrients.;Short Term Goal: A plan has been developed with personal nutrition goals set during dietitian appointment.             Nutrition Assessments:  Nutrition Assessments - 11/22/21 0912       Rate Your Plate Scores   Pre Score 53            MEDIFICTS Score Key: ?70 Need to make dietary changes  40-70 Heart Healthy Diet ? 40 Therapeutic Level Cholesterol Diet   Flowsheet Row CARDIAC REHAB PHASE II EXERCISE from 11/21/2021 in [facility]  Picture Your Plate Total Score on Admission 53      Picture Your Plate Scores: <46 Unhealthy dietary pattern with much room for improvement. 41-50 Dietary pattern unlikely to meet recommendations for good health and room for improvement. 51-60 More healthful dietary pattern, with some room for improvement.  >60 Healthy dietary pattern, although there may be some specific behaviors that could be improved.    Nutrition Goals Re-Evaluation:  Nutrition Goals Re-Evaluation     [place] Name 12/23/21 1530             Goals   Current Weight 155 lb 10.3 oz (70.6 kg)       Expected Outcome Goals in progress. Patient continues to work on heart healthy diet recommendations. She remains motivated to lose weight; however, she admits to overeating carbohydrates on occasion. Weight remains stable since starting with our program.                Nutrition Goals Re-Evaluation:  Nutrition Goals Re-Evaluation     [place] Name 12/23/21 1530             Goals   Current Weight 155 lb 10.3 oz (70.6 kg)       Expected Outcome Goals in progress. Patient continues to work on heart healthy diet recommendations. She remains motivated to lose weight; however, she admits to overeating carbohydrates on occasion. Weight remains stable since starting with our program.                Nutrition Goals Discharge (Final Nutrition Goals Re-Evaluation):  Nutrition Goals Re-Evaluation - 12/23/21 1530        Goals   Current Weight 155 lb 10.3 oz (70.6 kg)    Expected Outcome Goals in progress. Patient continues to work on heart healthy diet recommendations. She remains motivated to lose weight; however, she admits to overeating carbohydrates on occasion. Weight remains stable since starting with our program.  Psychosocial: Target Goals: Acknowledge presence or absence of significant depression and/or stress, maximize coping skills, provide positive support system. Participant is able to verbalize types and ability to use techniques and skills needed for reducing stress and depression.  Initial Review & Psychosocial Screening:  Initial Psych Review & Screening - 11/17/21 1512       Initial Review   Current issues with Current Stress Concerns    Source of Stress Concerns Family    Comments Giovannina's grandaughter just received a bone marrow biopsy. The whole family has been assisting with her recovery caring for her small great grandchildren      [place]? Yes   Nyeshia has her daughter for support who she lives with   Comments Will review quality of life in the upcoming week and offer support as needed      Barriers   Psychosocial barriers to participate in program The patient should benefit from training in stress management and relaxation.      Screening Interventions   Interventions To provide support and resources with identified psychosocial needs;Encouraged to exercise;Provide feedback about the scores to participant    Expected Outcomes Long Term Goal: Stressors or current issues are controlled or eliminated.;Short Term goal: Identification and review with participant of any Quality of Life or Depression concerns found by scoring the questionnaire.;Long Term goal: The participant improves quality of Life and PHQ9 Scores as seen by post scores and/or verbalization of changes             Quality of Life Scores:  Quality of Life -  11/17/21 1241       Quality of Life   Select Quality of Life      Quality of Life Scores   Health/Function Pre 18.15 %    Socioeconomic Pre 21.5 %    Psych/Spiritual Pre 23.57 %    Family Pre 19.5 %    GLOBAL Pre 20.31 %            Scores of 19 and below usually indicate a poorer quality of life in these areas.  A difference of  2-3 points is a clinically meaningful difference.  A difference of 2-3 points in the total score of the Quality of Life Index has been associated with significant improvement in overall quality of life, self-image, physical symptoms, and general health in studies assessing change in quality of life.  PHQ-9: Review Flowsheet       11/17/2021  Depression screen PHQ 2/9  Decreased Interest 0  Down, Depressed, Hopeless 0  PHQ - 2 Score 0   Interpretation of Total Score  Total Score Depression Severity:  1-4 = Minimal depression, 5-9 = Mild depression, 10-14 = Moderate depression, 15-19 = Moderately severe depression, 20-27 = Severe depression   Psychosocial Evaluation and Intervention:   Psychosocial Re-Evaluation:  Psychosocial Re-Evaluation     [place] Name 11/22/21 0954 12/07/21 1722 12/29/21 1536         Psychosocial Re-Evaluation   Current issues with Current Stress Concerns Current Stress Concerns Current Stress Concerns     Comments Will review quality of life questionnaire with [place] this week Reviewed quality of life questionnaire with the patient. Chasiti reports feeling better since participaitng in cardiac rehab. Denies any depression Samah reports feeling better since participaitng in cardiac rehab. Denies any depression     Expected Outcomes Lorri will have decreased stress upon completion of phase 2 cardiac rehab Aziah will have decreased stress upon completion  of phase 2 cardiac rehab Deronda will have decreased stress upon completion of phase 2 cardiac rehab     Interventions Stress management education;Encouraged to attend Cardiac  Rehabilitation for the exercise Stress management education;Encouraged to attend Cardiac Rehabilitation for the exercise Stress management education;Encouraged to attend Cardiac Rehabilitation for the exercise     Continue Psychosocial Services  Follow up required by staff Follow up required by staff No Follow up required       Initial Review   Source of Stress Concerns Family Family Family     Comments Will continue to monitor and offer support as needed Will continue to monitor and offer support as needed Will continue to monitor and offer support as needed              Psychosocial Discharge (Final Psychosocial Re-Evaluation):  Psychosocial Re-Evaluation - 12/29/21 1536       Psychosocial Re-Evaluation   Current issues with Current Stress Concerns    Comments Briyanna reports feeling better since participaitng in cardiac rehab. Denies any depression    Expected Outcomes Onica will have decreased stress upon completion of phase 2 cardiac rehab    Interventions Stress management education;Encouraged to attend Cardiac Rehabilitation for the exercise    Continue Psychosocial Services  No Follow up required      Initial Review   Source of Stress Concerns Family    Comments Will continue to monitor and offer support as needed             Vocational Rehabilitation: Provide vocational rehab assistance to qualifying candidates.   Vocational Rehab Evaluation & Intervention:  Vocational Rehab - 11/17/21 1516       Initial Vocational Rehab Evaluation & Intervention   Assessment shows need for Vocational Rehabilitation No   Teya is retired and does not need vocatinal rehab at this time            Education: Education Goals: Education classes will be provided on a weekly basis, covering required topics. Participant will state understanding/return demonstration of topics presented.     Core Videos: Exercise    Move It!  Clinical staff conducted group or individual video  education with verbal and written material and guidebook.  Patient learns the recommended [place] exercise program. Exercise with the goal of living a long, healthy life. Some of the health benefits of exercise include controlled diabetes, healthier blood pressure levels, improved cholesterol levels, improved heart and lung capacity, improved sleep, and better body composition. Everyone should speak with their doctor before starting or changing an exercise routine.  Biomechanical Limitations Clinical staff conducted group or individual video education with verbal and written material and guidebook.  Patient learns how biomechanical limitations can impact exercise and how we can mitigate and possibly overcome limitations to have an impactful and balanced exercise routine.  Body Composition Clinical staff conducted group or individual video education with verbal and written material and guidebook.  Patient learns that body composition (ratio of muscle mass to fat mass) is a key component to assessing overall fitness, rather than body weight alone. Increased fat mass, especially visceral belly fat, can put [place] at increased risk for metabolic syndrome, type 2 diabetes, heart disease, and even death. It is recommended to combine diet and exercise (cardiovascular and resistance training) to improve your body composition. Seek guidance from your physician and exercise physiologist before implementing an exercise routine.  Exercise Action Plan Clinical staff conducted group or individual video education with verbal and written material and  guidebook.  Patient learns the recommended strategies to achieve and enjoy long-term exercise adherence, including variety, self-motivation, self-efficacy, and positive decision making. Benefits of exercise include fitness, good health, weight management, more energy, better sleep, less stress, and overall well-being.  Medical   Heart Disease Risk Reduction Clinical staff  conducted group or individual video education with verbal and written material and guidebook.  Patient learns our heart is our most vital organ as it circulates oxygen, nutrients, white blood cells, and hormones throughout the entire body, and carries waste away. Data supports a plant-based eating plan like the Pritikin Program for its effectiveness in slowing progression of and reversing heart disease. The video provides a number of recommendations to address heart disease.   Metabolic Syndrome and Belly Fat  Clinical staff conducted group or individual video education with verbal and written material and guidebook.  Patient learns what metabolic syndrome is, how it leads to heart disease, and how one can reverse it and keep it from coming back. You have metabolic syndrome if you have 3 of the following 5 criteria: abdominal obesity, high blood pressure, high triglycerides, low HDL cholesterol, and high blood sugar.  Hypertension and Heart Disease Clinical staff conducted group or individual video education with verbal and written material and guidebook.  Patient learns that high blood pressure, or hypertension, is very common in the [place]. Hypertension is largely due to excessive salt intake, but other important risk factors include being overweight, physical inactivity, drinking too much alcohol, smoking, and not eating enough potassium from fruits and vegetables. High blood pressure is a leading risk factor for heart attack, stroke, congestive heart failure, dementia, kidney failure, and premature death. Long-term effects of excessive salt intake include stiffening of the arteries and thickening of heart muscle and organ damage. Recommendations include ways to reduce hypertension and the risk of heart disease.  Diseases of Our Time - Focusing on Diabetes Clinical staff conducted group or individual video education with verbal and written material and guidebook.  Patient learns why the best  way to stop diseases of our time is prevention, through food and other lifestyle changes. Medicine (such as prescription pills and surgeries) is often only a Band-Aid on the problem, not a long-term solution. Most common diseases of our time include obesity, type 2 diabetes, hypertension, heart disease, and cancer. The Pritikin Program is recommended and has been proven to help reduce, reverse, and/or prevent the damaging effects of metabolic syndrome.  Nutrition   Overview of the Pritikin Eating Plan  Clinical staff conducted group or individual video education with verbal and written material and guidebook.  Patient learns about the [place] for disease risk reduction. The [place] emphasizes a wide variety of unrefined, minimally-processed carbohydrates, like fruits, vegetables, whole grains, and legumes. Go, Caution, and Stop food choices are explained. Plant-based and lean animal proteins are emphasized. Rationale provided for low sodium intake for blood pressure control, low added sugars for blood sugar stabilization, and low added fats and oils for coronary artery disease risk reduction and weight management.  Calorie Density  Clinical staff conducted group or individual video education with verbal and written material and guidebook.  Patient learns about calorie density and how it impacts the Pritikin Eating Plan. Knowing the characteristics of the food you choose will help you decide whether those foods will lead to weight gain or weight loss, and whether you want to consume more or less of them. Weight loss is usually a side effect of  the [place] because of its focus on low calorie-dense foods.  Label Reading  Clinical staff conducted group or individual video education with verbal and written material and guidebook.  Patient learns about the Pritikin recommended label reading guidelines and corresponding recommendations regarding calorie density, added  sugars, sodium content, and whole grains.  Dining Out - Part 1  Clinical staff conducted group or individual video education with verbal and written material and guidebook.  Patient learns that restaurant meals can be sabotaging because they can be so high in calories, fat, sodium, and/or sugar. Patient learns recommended strategies on how to positively address this and avoid unhealthy pitfalls.  Facts on Fats  Clinical staff conducted group or individual video education with verbal and written material and guidebook.  Patient learns that lifestyle modifications can be just as effective, if not more so, as many medications for lowering your risk of heart disease. A Pritikin lifestyle can help to reduce your risk of inflammation and atherosclerosis (cholesterol build-up, or plaque, in the artery walls). Lifestyle interventions such as dietary choices and physical activity address the cause of atherosclerosis. A review of the types of fats and their impact on blood cholesterol levels, along with dietary recommendations to reduce fat intake is also included.  Nutrition Action Plan  Clinical staff conducted group or individual video education with verbal and written material and guidebook.  Patient learns how to incorporate Pritikin recommendations into their lifestyle. Recommendations include planning and keeping personal health goals in mind as an important part of their success.  Healthy Mind-Set    Healthy Minds, Bodies, Hearts  Clinical staff conducted group or individual video education with verbal and written material and guidebook.  Patient learns how to identify when they are stressed. Video will discuss the impact of that stress, as well as the many benefits of stress management. Patient will also be introduced to stress management techniques. The way we think, act, and feel has an impact on our hearts.  How Our Thoughts Can Heal Our Hearts  Clinical staff conducted group or individual  video education with verbal and written material and guidebook.  Patient learns that negative thoughts can cause depression and anxiety. This can result in negative lifestyle behavior and serious health problems. Cognitive behavioral therapy is an effective method to help control our thoughts in order to change and improve our emotional outlook.  Additional Videos:  Exercise    Improving Performance  Clinical staff conducted group or individual video education with verbal and written material and guidebook.  Patient learns to use a non-linear approach by alternating intensity levels and lengths of time spent exercising to help burn more calories and lose more body fat. Cardiovascular exercise helps improve heart health, metabolism, hormonal balance, blood sugar control, and recovery from fatigue. Resistance training improves strength, endurance, balance, coordination, reaction time, metabolism, and muscle mass. Flexibility exercise improves circulation, posture, and balance. Seek guidance from your physician and exercise physiologist before implementing an exercise routine and learn your capabilities and proper form for all exercise.  Introduction to Yoga  Clinical staff conducted group or individual video education with verbal and written material and guidebook.  Patient learns about yoga, a discipline of the coming together of mind, breath, and body. The benefits of yoga include improved flexibility, improved range of motion, better posture and core strength, increased lung function, weight loss, and positive self-image. Yoga's heart health benefits include lowered blood pressure, healthier heart rate, decreased cholesterol and triglyceride levels, improved immune function,  and reduced stress. Seek guidance from your physician and exercise physiologist before implementing an exercise routine and learn your capabilities and proper form for all exercise.  Medical   Aging: Enhancing Your Quality of  Life  Clinical staff conducted group or individual video education with verbal and written material and guidebook.  Patient learns key strategies and recommendations to stay in good physical health and enhance quality of life, such as prevention strategies, having an advocate, securing a [place], and keeping a list of medications and system for tracking them. It also discusses how to avoid risk for bone loss.  Biology of Weight Control  Clinical staff conducted group or individual video education with verbal and written material and guidebook.  Patient learns that weight gain occurs because we consume more calories than we burn (eating more, moving less). Even if your body weight is normal, you may have higher ratios of fat compared to muscle mass. Too much body fat puts you at increased risk for cardiovascular disease, heart attack, stroke, type 2 diabetes, and obesity-related cancers. In addition to exercise, following the [place] can help reduce your risk.  Decoding Lab Results  Clinical staff conducted group or individual video education with verbal and written material and guidebook.  Patient learns that lab test reflects one measurement whose values change over time and are influenced by many factors, including medication, stress, sleep, exercise, food, hydration, pre-existing medical conditions, and more. It is recommended to use the knowledge from this video to become more involved with your lab results and evaluate your numbers to speak with your doctor.   Diseases of Our Time - Overview  Clinical staff conducted group or individual video education with verbal and written material and guidebook.  Patient learns that according to the CDC, 50% to 70% of chronic diseases (such as obesity, type 2 diabetes, elevated lipids, hypertension, and heart disease) are avoidable through lifestyle improvements including healthier food choices, listening to  satiety cues, and increased physical activity.  Sleep Disorders Clinical staff conducted group or individual video education with verbal and written material and guidebook.  Patient learns how good quality and duration of sleep are important to overall health and well-being. Patient also learns about sleep disorders and how they impact health along with recommendations to address them, including discussing with a physician.  Nutrition  Dining Out - Part 2 Clinical staff conducted group or individual video education with verbal and written material and guidebook.  Patient learns how to plan ahead and communicate in order to maximize their dining experience in a healthy and nutritious manner. Included are recommended food choices based on the type of restaurant the patient is visiting.   Fueling a Best boy conducted group or individual video education with verbal and written material and guidebook.  There is a strong connection between our food choices and our health. Diseases like obesity and type 2 diabetes are very prevalent and are in large-part due to lifestyle choices. The Pritikin Eating Plan provides plenty of food and hunger-curbing satisfaction. It is easy to follow, affordable, and helps reduce health risks.  Menu Workshop  Clinical staff conducted group or individual video education with verbal and written material and guidebook.  Patient learns that restaurant meals can sabotage health goals because they are often packed with calories, fat, sodium, and sugar. Recommendations include strategies to plan ahead and to communicate with the manager, chef, or server to help order a healthier  meal.  Planning Your Eating Strategy  Clinical staff conducted group or individual video education with verbal and written material and guidebook.  Patient learns about the [place] and its benefit of reducing the risk of disease. The [place] does not focus on  calories. Instead, it emphasizes high-quality, nutrient-rich foods. By knowing the characteristics of the foods, we choose, we can determine their calorie density and make informed decisions.  Targeting Your Nutrition Priorities  Clinical staff conducted group or individual video education with verbal and written material and guidebook.  Patient learns that lifestyle habits have a tremendous impact on disease risk and progression. This video provides eating and physical activity recommendations based on your personal health goals, such as reducing LDL cholesterol, losing weight, preventing or controlling type 2 diabetes, and reducing high blood pressure.  Vitamins and Minerals  Clinical staff conducted group or individual video education with verbal and written material and guidebook.  Patient learns different ways to obtain key vitamins and minerals, including through a recommended healthy diet. It is important to discuss all supplements you take with your doctor.   Healthy Mind-Set    Smoking Cessation  Clinical staff conducted group or individual video education with verbal and written material and guidebook.  Patient learns that cigarette smoking and tobacco addiction pose a serious health risk which affects millions of people. Stopping smoking will significantly reduce the risk of heart disease, lung disease, and many forms of cancer. Recommended strategies for quitting are covered, including working with your doctor to develop a successful plan.  Culinary   Becoming a Financial trader conducted group or individual video education with verbal and written material and guidebook.  Patient learns that cooking at home can be healthy, cost-effective, quick, and puts them in control. Keys to cooking healthy recipes will include looking at your recipe, assessing your equipment needs, planning ahead, making it simple, choosing cost-effective seasonal ingredients, and limiting the use of  added fats, salts, and sugars.  Cooking - Breakfast and Snacks  Clinical staff conducted group or individual video education with verbal and written material and guidebook.  Patient learns how important breakfast is to satiety and nutrition through the entire day. Recommendations include key foods to eat during breakfast to help stabilize blood sugar levels and to prevent overeating at meals later in the day. Planning ahead is also a key component.  Cooking - Human resources officer conducted group or individual video education with verbal and written material and guidebook.  Patient learns eating strategies to improve overall health, including an approach to cook more at home. Recommendations include thinking of animal protein as a side on your plate rather than center stage and focusing instead on lower calorie dense options like vegetables, fruits, whole grains, and plant-based proteins, such as beans. Making sauces in large quantities to freeze for later and leaving the skin on your vegetables are also recommended to maximize your experience.  Cooking - Healthy Salads and Dressing Clinical staff conducted group or individual video education with verbal and written material and guidebook.  Patient learns that vegetables, fruits, whole grains, and legumes are the foundations of the [place]. Recommendations include how to incorporate each of these in flavorful and healthy salads, and how to create homemade salad dressings. Proper handling of ingredients is also covered. Cooking - Soups and [employer] - Soups and Desserts Clinical staff conducted group or individual video education with verbal and written material  and guidebook.  Patient learns that Pritikin soups and desserts make for easy, nutritious, and delicious snacks and meal components that are low in sodium, fat, sugar, and calorie density, while high in vitamins, minerals, and filling fiber. Recommendations  include simple and healthy ideas for soups and desserts.   Overview     The Pritikin Solution Program Overview Clinical staff conducted group or individual video education with verbal and written material and guidebook.  Patient learns that the results of the [place] Program have been documented in more than 100 articles published in peer-reviewed journals, and the benefits include reducing risk factors for (and, in some cases, even reversing) high cholesterol, high blood pressure, type 2 diabetes, obesity, and more! An overview of the three key pillars of the Pritikin Program will be covered: eating well, doing regular exercise, and having a healthy mind-set.  WORKSHOPS  Exercise: Exercise Basics: Building Your Action Plan Clinical staff led group instruction and group discussion with PowerPoint presentation and patient guidebook. To enhance the learning environment the use of posters, models and videos may be added. At the conclusion of this workshop, patients will comprehend the difference between physical activity and exercise, as well as the benefits of incorporating both, into their routine. Patients will understand the FITT (Frequency, Intensity, Time, and Type) principle and how to use it to build an exercise action plan. In addition, safety concerns and other considerations for exercise and cardiac rehab will be addressed by the presenter. The purpose of this lesson is to promote a comprehensive and effective weekly exercise routine in order to improve patients' overall level of fitness.   Managing Heart Disease: Your Path to a Healthier Heart Clinical staff led group instruction and group discussion with PowerPoint presentation and patient guidebook. To enhance the learning environment the use of posters, models and videos may be added.At the conclusion of this workshop, patients will understand the anatomy and physiology of the heart. Additionally, they will understand how  Pritikin's three pillars impact the risk factors, the progression, and the management of heart disease.  The purpose of this lesson is to provide a high-level overview of the heart, heart disease, and how the Pritikin lifestyle positively impacts risk factors.  Exercise Biomechanics Clinical staff led group instruction and group discussion with PowerPoint presentation and patient guidebook. To enhance the learning environment the use of posters, models and videos may be added. Patients will learn how the structural parts of their bodies function and how these functions impact their daily activities, movement, and exercise. Patients will learn how to promote a neutral spine, learn how to manage pain, and identify ways to improve their physical movement in order to promote healthy living. The purpose of this lesson is to expose patients to common physical limitations that impact physical activity. Participants will learn practical ways to adapt and manage aches and pains, and to minimize their effect on regular exercise. Patients will learn how to maintain good posture while sitting, walking, and lifting.  Balance Training and Fall Prevention  Clinical staff led group instruction and group discussion with PowerPoint presentation and patient guidebook. To enhance the learning environment the use of posters, models and videos may be added. At the conclusion of this workshop, patients will understand the importance of their sensorimotor skills (vision, proprioception, and the vestibular system) in maintaining their ability to balance as they age. Patients will apply a variety of balancing exercises that are appropriate for their current level of function. Patients will understand the common causes  for poor balance, possible solutions to these problems, and ways to modify their physical environment in order to minimize their fall risk. The purpose of this lesson is to teach patients about the  importance of maintaining balance as they age and ways to minimize their risk of falling.  WORKSHOPS   Nutrition:  Fueling a Scientist, research (physical sciences) led group instruction and group discussion with PowerPoint presentation and patient guidebook. To enhance the learning environment the use of posters, models and videos may be added. Patients will review the foundational principles of the [place] and understand what constitutes a serving size in each of the food groups. Patients will also learn Pritikin-friendly foods that are better choices when away from home and review make-ahead meal and snack options. Calorie density will be reviewed and applied to three nutrition priorities: weight maintenance, weight loss, and weight gain. The purpose of this lesson is to reinforce (in a group setting) the key concepts around what patients are recommended to eat and how to apply these guidelines when away from home by planning and selecting Pritikin-friendly options. Patients will understand how calorie density may be adjusted for different weight management goals.  Mindful Eating  Clinical staff led group instruction and group discussion with PowerPoint presentation and patient guidebook. To enhance the learning environment the use of posters, models and videos may be added. Patients will briefly review the concepts of the [place] and the importance of low-calorie dense foods. The concept of mindful eating will be introduced as well as the importance of paying attention to internal hunger signals. Triggers for non-hunger eating and techniques for dealing with triggers will be explored. The purpose of this lesson is to provide patients with the opportunity to review the basic principles of the [place], discuss the value of eating mindfully and how to measure internal cues of hunger and fullness using the Hunger Scale. Patients will also discuss reasons for non-hunger eating and  learn strategies to use for controlling emotional eating.  Targeting Your Nutrition Priorities Clinical staff led group instruction and group discussion with PowerPoint presentation and patient guidebook. To enhance the learning environment the use of posters, models and videos may be added. Patients will learn how to determine their genetic susceptibility to disease by reviewing their family history. Patients will gain insight into the importance of diet as part of an overall healthy lifestyle in mitigating the impact of genetics and other environmental insults. The purpose of this lesson is to provide patients with the opportunity to assess their personal nutrition priorities by looking at their family history, their own health history and current risk factors. Patients will also be able to discuss ways of prioritizing and modifying the [place] for their highest risk areas  Menu  Clinical staff led group instruction and group discussion with PowerPoint presentation and patient guidebook. To enhance the learning environment the use of posters, models and videos may be added. Using menus brought in from [employer], or printed from [employer], patients will apply the [place] dining out guidelines that were presented in the [employer] video. Patients will also be able to practice these guidelines in a variety of provided scenarios. The purpose of this lesson is to provide patients with the opportunity to practice hands-on learning of the [place] with actual menus and practice scenarios.  Label Reading Clinical staff led group instruction and group discussion with PowerPoint presentation and patient guidebook. To enhance  the learning environment the use of posters, models and videos may be added. Patients will review and discuss the Pritikin label reading guidelines presented in Pritikin's Label Reading Educational series video. Using fool  labels brought in from local grocery stores and markets, patients will apply the label reading guidelines and determine if the packaged food meet the Pritikin guidelines. The purpose of this lesson is to provide patients with the opportunity to review, discuss, and practice hands-on learning of the Pritikin Label Reading guidelines with actual packaged food labels. [place] Workshops are designed to teach patients ways to prepare quick, simple, and affordable recipes at home. The importance of nutrition's role in chronic disease risk reduction is reflected in its emphasis in the overall [employer] program. By learning how to prepare essential core [employer] Eating Plan recipes, patients will increase control over what they eat; be able to customize the flavor of foods without the use of added salt, sugar, or fat; and improve the quality of the food they consume. By learning a set of core recipes which are easily assembled, quickly prepared, and affordable, patients are more likely to prepare more healthy foods at home. These workshops focus on convenient breakfasts, simple entres, side dishes, and desserts which can be prepared with minimal effort and are consistent with nutrition recommendations for cardiovascular risk reduction. Cooking [employer] are taught by a Engineer, materials (RD) who has been trained by the [employer]. The chef or RD has a clear understanding of the importance of minimizing - if not completely eliminating - added fat, sugar, and sodium in recipes. Throughout the series of [place] Workshop sessions, patients will learn about healthy ingredients and efficient methods of cooking to build confidence in their capability to prepare    Cooking School weekly topics:  Adding Flavor- Sodium-Free  Fast and Healthy Breakfasts  Powerhouse Plant-Based Proteins  Satisfying Salads and Dressings  Simple Sides and  Sauces  International Cuisine-Spotlight on the [place] Zones  Delicious Desserts  Savory Soups  Efficiency Cooking - Meals in a Snap  Tasty Appetizers and Snacks  Comforting Weekend Breakfasts  One-Pot Wonders   Fast Evening Meals  Easy [place] (Psychosocial): New Thoughts, New Behaviors Clinical staff led group instruction and group discussion with PowerPoint presentation and patient guidebook. To enhance the learning environment the use of posters, models and videos may be added. Patients will learn and practice techniques for developing effective health and lifestyle goals. Patients will be able to effectively apply the goal setting process learned to develop at least one new personal goal.  The purpose of this lesson is to expose patients to a new skill set of behavior modification techniques such as techniques setting SMART goals, overcoming barriers, and achieving new thoughts and new behaviors.  Managing Moods and Relationships Clinical staff led group instruction and group discussion with PowerPoint presentation and patient guidebook. To enhance the learning environment the use of posters, models and videos may be added. Patients will learn how emotional and chronic stress factors can impact their health and relationships. They will learn healthy ways to manage their moods and utilize positive coping mechanisms. In addition, ICR patients will learn ways to improve communication skills. The purpose of this lesson is to expose patients to ways of understanding how one's mood and health are intimately connected. Developing a healthy outlook can help build positive relationships and connections with others. Patients  will understand the importance of utilizing effective communication skills that include actively listening and being heard. They will learn and understand the importance of the "4 Cs" and especially Connections in  fostering of a Healthy Mind-Set.  Healthy Sleep for a Healthy Heart Clinical staff led group instruction and group discussion with PowerPoint presentation and patient guidebook. To enhance the learning environment the use of posters, models and videos may be added. At the conclusion of this workshop, patients will be able to demonstrate knowledge of the importance of sleep to overall health, well-being, and quality of life. They will understand the symptoms of, and treatments for, common sleep disorders. Patients will also be able to identify daytime and nighttime behaviors which impact sleep, and they will be able to apply these tools to help manage sleep-related challenges. The purpose of this lesson is to provide patients with a general overview of sleep and outline the importance of quality sleep. Patients will learn about a few of the most common sleep disorders. Patients will also be introduced to the concept of "sleep hygiene," and discover ways to self-manage certain sleeping problems through simple daily behavior changes. Finally, the workshop will motivate patients by clarifying the links between quality sleep and their goals of heart-healthy living.   Recognizing and Reducing Stress Clinical staff led group instruction and group discussion with PowerPoint presentation and patient guidebook. To enhance the learning environment the use of posters, models and videos may be added. At the conclusion of this workshop, patients will be able to understand the types of stress reactions, differentiate between acute and chronic stress, and recognize the impact that chronic stress has on their health. They will also be able to apply different coping mechanisms, such as reframing negative self-talk. Patients will have the opportunity to practice a variety of stress management techniques, such as deep abdominal breathing, progressive muscle relaxation, and/or guided imagery.  The purpose of this lesson is to  educate patients on the role of stress in their lives and to provide healthy techniques for coping with it.  Learning Barriers/Preferences:  Learning Barriers/Preferences - 11/17/21 1300       Learning Barriers/Preferences   Learning Barriers Exercise Concerns    Learning Preferences Computer/Internet;Pictoral;Written Material             Education Topics:  Knowledge Questionnaire Score:  Knowledge Questionnaire Score - 11/17/21 1301       Knowledge Questionnaire Score   Pre Score 19/24             Core Components/Risk Factors/Patient Goals at Admission:  Personal Goals and Risk Factors at Admission - 11/17/21 1305       Core Components/Risk Factors/Patient Goals on Admission    Weight Management Yes;Obesity;Weight Loss    Intervention Weight Management: Develop a combined nutrition and exercise program designed to reach desired caloric intake, while maintaining appropriate intake of nutrient and fiber, sodium and fats, and appropriate energy expenditure required for the weight goal.;Weight Management: Provide education and appropriate resources to help participant work on and attain dietary goals.;Weight Management/Obesity: Establish reasonable short term and long term weight goals.;Obesity: Provide education and appropriate resources to help participant work on and attain dietary goals.    Admit Weight 155 lb 6.8 oz (70.5 kg)    Expected Outcomes Short Term: Continue to assess and modify interventions until short term weight is achieved;Long Term: Adherence to nutrition and physical activity/exercise program aimed toward attainment of established weight goal;Weight Loss: Understanding of general recommendations for a balanced  deficit meal plan, which promotes 1-2 lb weight loss per week and includes a negative energy balance of [phone] kcal/d;Understanding recommendations for meals to include 15-35% energy as protein, 25-35% energy from fat, 35-60% energy from carbohydrates,  less than $RemoveB'200mg'kUXzevZc$  of dietary cholesterol, 20-35 gm of total fiber daily;Understanding of distribution of calorie intake throughout the day with the consumption of 4-5 meals/snacks    Diabetes Yes    Intervention Provide education about signs/symptoms and action to take for hypo/hyperglycemia.;Provide education about proper nutrition, including hydration, and aerobic/resistive exercise prescription along with prescribed medications to achieve blood glucose in normal ranges: Fasting glucose 65-99 mg/dL    Expected Outcomes Short Term: Participant verbalizes understanding of the signs/symptoms and immediate care of hyper/hypoglycemia, proper foot care and importance of medication, aerobic/resistive exercise and nutrition plan for blood glucose control.;Long Term: Attainment of HbA1C < 7%.    Hypertension Yes    Intervention Provide education on lifestyle modifcations including regular physical activity/exercise, weight management, moderate sodium restriction and increased consumption of fresh fruit, vegetables, and low fat dairy, alcohol moderation, and smoking cessation.;Monitor prescription use compliance.    Expected Outcomes Short Term: Continued assessment and intervention until BP is < 140/29mm HG in hypertensive participants. < 130/79mm HG in hypertensive participants with diabetes, heart failure or chronic kidney disease.;Long Term: Maintenance of blood pressure at goal levels.    Lipids Yes    Intervention Provide education and support for participant on nutrition & aerobic/resistive exercise along with prescribed medications to achieve LDL '70mg'$ , HDL >$Remo'40mg'Pqqqd$ .    Expected Outcomes Short Term: Participant states understanding of desired cholesterol values and is compliant with medications prescribed. Participant is following exercise prescription and nutrition guidelines.;Long Term: Cholesterol controlled with medications as prescribed, with individualized exercise RX and with personalized nutrition plan.  Value goals: LDL < $Rem'70mg'zEDM$ , HDL > 40 mg.    Stress Yes    Intervention Offer individual and/or small group education and counseling on adjustment to heart disease, stress management and health-related lifestyle change. Teach and support self-help strategies.;Refer participants experiencing significant psychosocial distress to appropriate mental health specialists for further evaluation and treatment. When possible, include family members and significant others in education/counseling sessions.    Expected Outcomes Short Term: Participant demonstrates changes in health-related behavior, relaxation and other stress management skills, ability to obtain effective social support, and compliance with psychotropic medications if prescribed.;Long Term: Emotional wellbeing is indicated by absence of clinically significant psychosocial distress or social isolation.    Personal Goal Other Yes    Personal Goal Long and short term: strength, wt loss and balance    Intervention Will continue to monitor pt and progress workloads as tolerated without dign or symptom    Expected Outcomes Pt will achieve her goals             Core Components/Risk Factors/Patient Goals Review:   Goals and Risk Factor Review     Row Name 11/22/21 0957 12/07/21 1724 12/29/21 1536         Core Components/Risk Factors/Patient Goals Review   Personal Goals Review Weight Management/Obesity;Stress;Lipids;Diabetes;Hypertension Weight Management/Obesity;Stress;Lipids;Diabetes;Hypertension Weight Management/Obesity;Stress;Lipids;Diabetes;Hypertension     Review Sharonica started cardiac rehab on 11/21/21. Sibel did well with exercise.  Vital signs and CBG's were stable. Kamia is a diet controlled Keionna is doing  well with exercise.  Vital signs have been stable. Mystery reports feeling stronger and has more energy. Sagrario continues to do well with exercise.  Vital signs have been stable. Artia reports feeling stronger and has more  energy. Casy  will complete phase 2 cardiac rehab on 01/13/22.     Expected Outcomes Alaynah will continue to particpate in phase 2 cardiac rehab for exercise, nutrition and lifestyle modifications Jashayla will continue to particpate in phase 2 cardiac rehab for exercise, nutrition and lifestyle modifications Kataryna will continue to particpate in phase 2 cardiac rehab for exercise, nutrition and lifestyle modifications              Core Components/Risk Factors/Patient Goals at Discharge (Final Review):   Goals and Risk Factor Review - 12/29/21 1536       Core Components/Risk Factors/Patient Goals Review   Personal Goals Review Weight Management/Obesity;Stress;Lipids;Diabetes;Hypertension    Review Parker continues to do well with exercise.  Vital signs have been stable. Jailey reports feeling stronger and has more energy. Nadine will complete phase 2 cardiac rehab on 01/13/22.    Expected Outcomes Kip will continue to particpate in phase 2 cardiac rehab for exercise, nutrition and lifestyle modifications             ITP Comments:  ITP Comments     Row Name 11/17/21 1508 11/22/21 0952 12/07/21 1721 12/29/21 1535     ITP Comments Dr Fransico Him MD, Medical Director 30 Day ITP Review. Alyah started exercise at cardiac rehab on 11/21/21 and did well with exercise. 30 Day ITP Review. Marisah has good attendance and participation in phase 2 cardiac rehab. Elyana is off to a good start to exercise 30 Day ITP Review. Raigen has good attendance and participation in phase 2 cardiac rehab. Ulani will complete cardiac rehab on 01/13/22             Comments: See ITP comments.Harrell Gave RN BSN

## 2021-12-30 ENCOUNTER — Encounter (HOSPITAL_COMMUNITY)
Admission: RE | Admit: 2021-12-30 | Discharge: 2021-12-30 | Disposition: A | Discharge status: Home / Self Care | Payer: Medicare Other | Source: Ambulatory Visit | Attending: Internal Medicine | Admitting: Internal Medicine

## 2021-12-30 DIAGNOSIS — Z951 Presence of aortocoronary bypass graft: Secondary | ICD-10-CM

## 2021-12-30 DIAGNOSIS — I214 Non-ST elevation (NSTEMI) myocardial infarction: Secondary | ICD-10-CM

## 2022-01-02 ENCOUNTER — Encounter (HOSPITAL_COMMUNITY)
Admission: RE | Admit: 2022-01-02 | Discharge: 2022-01-02 | Disposition: A | Discharge status: Home / Self Care | Payer: Medicare Other | Source: Ambulatory Visit | Attending: Internal Medicine | Admitting: Internal Medicine

## 2022-01-02 DIAGNOSIS — I214 Non-ST elevation (NSTEMI) myocardial infarction: Secondary | ICD-10-CM

## 2022-01-02 DIAGNOSIS — Z5189 Encounter for other specified aftercare: Secondary | ICD-10-CM | POA: Diagnosis not present

## 2022-01-02 DIAGNOSIS — Z951 Presence of aortocoronary bypass graft: Secondary | ICD-10-CM | POA: Insufficient documentation

## 2022-01-02 DIAGNOSIS — I252 Old myocardial infarction: Secondary | ICD-10-CM | POA: Diagnosis not present

## 2022-01-04 ENCOUNTER — Encounter (HOSPITAL_COMMUNITY)
Admission: RE | Admit: 2022-01-04 | Discharge: 2022-01-04 | Disposition: A | Discharge status: Home / Self Care | Payer: Medicare Other | Source: Ambulatory Visit | Attending: Internal Medicine | Admitting: Internal Medicine

## 2022-01-04 DIAGNOSIS — Z951 Presence of aortocoronary bypass graft: Secondary | ICD-10-CM

## 2022-01-04 DIAGNOSIS — I214 Non-ST elevation (NSTEMI) myocardial infarction: Secondary | ICD-10-CM

## 2022-01-04 DIAGNOSIS — Z5189 Encounter for other specified aftercare: Secondary | ICD-10-CM | POA: Diagnosis not present

## 2022-01-06 ENCOUNTER — Encounter (HOSPITAL_COMMUNITY)
Admission: RE | Admit: 2022-01-06 | Discharge: 2022-01-06 | Disposition: A | Discharge status: Home / Self Care | Payer: Medicare Other | Source: Ambulatory Visit | Attending: Internal Medicine | Admitting: Internal Medicine

## 2022-01-06 DIAGNOSIS — I214 Non-ST elevation (NSTEMI) myocardial infarction: Secondary | ICD-10-CM

## 2022-01-06 DIAGNOSIS — Z5189 Encounter for other specified aftercare: Secondary | ICD-10-CM | POA: Diagnosis not present

## 2022-01-06 DIAGNOSIS — Z951 Presence of aortocoronary bypass graft: Secondary | ICD-10-CM

## 2022-01-09 ENCOUNTER — Encounter (HOSPITAL_COMMUNITY)
Admission: RE | Admit: 2022-01-09 | Discharge: 2022-01-09 | Disposition: A | Discharge status: Home / Self Care | Payer: Medicare Other | Source: Ambulatory Visit | Attending: Internal Medicine | Admitting: Internal Medicine

## 2022-01-09 VITALS — Ht 59.0 in | Wt 154.3 lb

## 2022-01-09 DIAGNOSIS — I214 Non-ST elevation (NSTEMI) myocardial infarction: Secondary | ICD-10-CM

## 2022-01-09 DIAGNOSIS — Z951 Presence of aortocoronary bypass graft: Secondary | ICD-10-CM

## 2022-01-09 DIAGNOSIS — Z5189 Encounter for other specified aftercare: Secondary | ICD-10-CM | POA: Diagnosis not present

## 2022-01-11 ENCOUNTER — Encounter (HOSPITAL_COMMUNITY)
Admission: RE | Admit: 2022-01-11 | Discharge: 2022-01-11 | Disposition: A | Discharge status: Home / Self Care | Payer: Medicare Other | Source: Ambulatory Visit | Attending: Internal Medicine | Admitting: Internal Medicine

## 2022-01-11 DIAGNOSIS — Z5189 Encounter for other specified aftercare: Secondary | ICD-10-CM | POA: Diagnosis not present

## 2022-01-11 DIAGNOSIS — I214 Non-ST elevation (NSTEMI) myocardial infarction: Secondary | ICD-10-CM

## 2022-01-11 DIAGNOSIS — Z951 Presence of aortocoronary bypass graft: Secondary | ICD-10-CM

## 2022-01-11 NOTE — Progress Notes (Signed)
Discharge Progress Report  Patient Details  Name: Tiffany Odonnell MRN: [id number] Date of Birth: April 12, 1951 Referring Provider:   Flowsheet Row CARDIAC REHAB PHASE II ORIENTATION from 11/17/2021 in [place]  Referring Provider Dr. Lyman Bishop MD        Number of Visits: 21  Reason for Discharge:  Patient reached a stable level of exercise. Patient independent in their exercise. Patient has met program and personal goals.  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 Lyman Bishop MD    Expected Discharge Date 01/13/22      NuStep   Level 1    SPM 80    Minutes 30    METs 1.7      Prescription Details   Frequency (times per week) 3    Duration Progress to 30 minutes of continuous aerobic without signs/symptoms of physical distress      Intensity   THRR 40-80% of Max Heartrate 60-120    Ratings of Perceived Exertion 11-13    Perceived Dyspnea 0-4      Progression   Progression Continue progressive overload as per policy without signs/symptoms or physical distress.      Resistance Training   Training Prescription Yes    Weight 2    Reps 10-15             Discharge Exercise Prescription (Final Exercise Prescription Changes):  Exercise Prescription Changes - 01/13/22 1621       Response to Exercise   Blood Pressure (Admit) 104/60    Blood Pressure (Exercise) 138/68    Blood Pressure (Exit) 116/70    Heart Rate (Admit) 58 bpm    Heart Rate (Exercise) 93 bpm    Heart Rate (Exit) 65 bpm    Rating of Perceived Exertion (Exercise) 11    Symptoms none    Comments Pt graduated the CRP2 program    Duration Continue with 30  min of aerobic exercise without signs/symptoms of physical distress.    Intensity THRR unchanged      Progression   Progression Continue to progress workloads to maintain intensity without signs/symptoms of physical distress.    Average METs 3.8      Resistance Training   Training Prescription Yes    Weight 2 lb    Reps 10-15    Time 10 Minutes      Interval Training   Interval Training No      NuStep   Level 4    SPM 100    Minutes 30    METs 3.8      Home Exercise Plan   Plans to continue exercise at Home (comment)    Frequency Add 3 additional days to program exercise sessions.    Initial Home Exercises Provided 12/21/21             Functional Capacity:  6 Minute Walk     Row Name 11/17/21 1249 01/09/22 1624       6 Minute Walk   Phase Initial Discharge    Distance 1241 feet 1927 feet    Distance % Change -- 55.28 %  Distance Feet Change -- 686 ft    Walk Time 6 minutes 6 minutes    # of Rest Breaks 0 0    MPH 2.35 3.65    METS 2.07 3.51    RPE 10 11    Perceived Dyspnea  0 0    VO2 Peak 7.26 12.29    Symptoms No No    Resting HR 56 bpm 59 bpm    Resting BP 122/70 110/70    Resting Oxygen Saturation  99 % 98 %    Exercise Oxygen Saturation  during 6 min walk 96 % 97 %    Max Ex. HR 71 bpm 88 bpm    Max Ex. BP 122/70 134/70    2 Minute Post BP 126/74 98/62             Psychological, QOL, Others - Outcomes: PHQ 2/9:    01/11/2022    3:38 PM 11/17/2021    3:19 PM  Depression screen PHQ 2/9  Decreased Interest 0 0  Down, Depressed, Hopeless 0 0  PHQ - 2 Score 0 0    Quality of Life:  Quality of Life - 01/11/22 1622       Quality of Life   Select Quality of Life      Quality of Life Scores   Health/Function Post 21.43 %    Socioeconomic Post 19.75 %    Psych/Spiritual Post 27 %    Family Post 15.4 %    GLOBAL Post 21.21 %             Personal Goals: Goals established at orientation with interventions provided to work  toward goal.  Personal Goals and Risk Factors at Admission - 11/17/21 1305       Core Components/Risk Factors/Patient Goals on Admission    Weight Management Yes;Obesity;Weight Loss    Intervention Weight Management: Develop a combined nutrition and exercise program designed to reach desired caloric intake, while maintaining appropriate intake of nutrient and fiber, sodium and fats, and appropriate energy expenditure required for the weight goal.;Weight Management: Provide education and appropriate resources to help participant work on and attain dietary goals.;Weight Management/Obesity: Establish reasonable short term and long term weight goals.;Obesity: Provide education and appropriate resources to help participant work on and attain dietary goals.    Admit Weight 155 lb 6.8 oz (70.5 kg)    Expected Outcomes Short Term: Continue to assess and modify interventions until short term weight is achieved;Long Term: Adherence to nutrition and physical activity/exercise program aimed toward attainment of established weight goal;Weight Loss: Understanding of general recommendations for a balanced deficit meal plan, which promotes 1-2 lb weight loss per week and includes a negative energy balance of 500-1000 kcal/d;Understanding recommendations for meals to include 15-35% energy as protein, 25-35% energy from fat, 35-60% energy from carbohydrates, less than 200mg of dietary cholesterol, 20-35 gm of total fiber daily;Understanding of distribution of calorie intake throughout the day with the consumption of 4-5 meals/snacks    Diabetes Yes    Intervention Provide education about signs/symptoms and action to take for hypo/hyperglycemia.;Provide education about proper nutrition, including hydration, and aerobic/resistive exercise prescription along with prescribed medications to achieve blood glucose in normal ranges: Fasting glucose 65-99 mg/dL    Expected Outcomes Short Term: Participant verbalizes understanding  of the signs/symptoms and immediate care of hyper/hypoglycemia, proper foot care and importance of medication, aerobic/resistive exercise and nutrition plan for blood glucose control.;Long Term: Attainment of HbA1C < 7%.    Hypertension Yes      Intervention Provide education on lifestyle modifcations including regular physical activity/exercise, weight management, moderate sodium restriction and increased consumption of fresh fruit, vegetables, and low fat dairy, alcohol moderation, and smoking cessation.;Monitor prescription use compliance.    Expected Outcomes Short Term: Continued assessment and intervention until BP is < 140/90mm HG in hypertensive participants. < 130/80mm HG in hypertensive participants with diabetes, heart failure or chronic kidney disease.;Long Term: Maintenance of blood pressure at goal levels.    Lipids Yes    Intervention Provide education and support for participant on nutrition & aerobic/resistive exercise along with prescribed medications to achieve LDL <70mg, HDL >40mg.    Expected Outcomes Short Term: Participant states understanding of desired cholesterol values and is compliant with medications prescribed. Participant is following exercise prescription and nutrition guidelines.;Long Term: Cholesterol controlled with medications as prescribed, with individualized exercise RX and with personalized nutrition plan. Value goals: LDL < 70mg, HDL > 40 mg.    Stress Yes    Intervention Offer individual and/or small group education and counseling on adjustment to heart disease, stress management and health-related lifestyle change. Teach and support self-help strategies.;Refer participants experiencing significant psychosocial distress to appropriate mental health specialists for further evaluation and treatment. When possible, include family members and significant others in education/counseling sessions.    Expected Outcomes Short Term: Participant demonstrates changes in  health-related behavior, relaxation and other stress management skills, ability to obtain effective social support, and compliance with psychotropic medications if prescribed.;Long Term: Emotional wellbeing is indicated by absence of clinically significant psychosocial distress or social isolation.    Personal Goal Other Yes    Personal Goal Long and short term: strength, wt loss and balance    Intervention Will continue to monitor pt and progress workloads as tolerated without dign or symptom    Expected Outcomes Pt will achieve her goals              Personal Goals Discharge:  Goals and Risk Factor Review     Row Name 11/22/21 0957 12/07/21 1724 12/29/21 1536         Core Components/Risk Factors/Patient Goals Review   Personal Goals Review Weight Management/Obesity;Stress;Lipids;Diabetes;Hypertension Weight Management/Obesity;Stress;Lipids;Diabetes;Hypertension Weight Management/Obesity;Stress;Lipids;Diabetes;Hypertension     Review Deandria started cardiac rehab on 11/21/21. Taunia did well with exercise.  Vital signs and CBG's were stable. Jaisa is a diet controlled Porsha is doing  well with exercise.  Vital signs have been stable. Abbi reports feeling stronger and has more energy. Tammatha continues to do well with exercise.  Vital signs have been stable. Kortlynn reports feeling stronger and has more energy. Aylee will complete phase 2 cardiac rehab on 01/13/22.     Expected Outcomes Elora will continue to particpate in phase 2 cardiac rehab for exercise, nutrition and lifestyle modifications Bao will continue to particpate in phase 2 cardiac rehab for exercise, nutrition and lifestyle modifications Aymee will continue to particpate in phase 2 cardiac rehab for exercise, nutrition and lifestyle modifications              Exercise Goals and Review:  Exercise Goals     Row Name 11/17/21 1257             Exercise Goals   Increase Physical Activity Yes       Intervention Provide  advice, education, support and counseling about physical activity/exercise needs.;Develop an individualized exercise prescription for aerobic and resistive training based on initial evaluation findings, risk stratification, comorbidities and participant's personal goals.       Expected   Outcomes Short Term: Attend rehab on a regular basis to increase amount of physical activity.;Long Term: Add in home exercise to make exercise part of routine and to increase amount of physical activity.;Long Term: Exercising regularly at least 3-5 days a week.       Increase Strength and Stamina Yes       Intervention Provide advice, education, support and counseling about physical activity/exercise needs.;Develop an individualized exercise prescription for aerobic and resistive training based on initial evaluation findings, risk stratification, comorbidities and participant's personal goals.       Expected Outcomes Short Term: Increase workloads from initial exercise prescription for resistance, speed, and METs.;Short Term: Perform resistance training exercises routinely during rehab and add in resistance training at home;Long Term: Improve cardiorespiratory fitness, muscular endurance and strength as measured by increased METs and functional capacity (6MWT)       Able to understand and use rate of perceived exertion (RPE) scale Yes       Intervention Provide education and explanation on how to use RPE scale       Expected Outcomes Short Term: Able to use RPE daily in rehab to express subjective intensity level;Long Term:  Able to use RPE to guide intensity level when exercising independently       Knowledge and understanding of Target Heart Rate Range (THRR) Yes       Intervention Provide education and explanation of THRR including how the numbers were predicted and where they are located for reference       Expected Outcomes Short Term: Able to state/look up THRR;Long Term: Able to use THRR to govern intensity when  exercising independently;Short Term: Able to use daily as guideline for intensity in rehab       Understanding of Exercise Prescription Yes       Intervention Provide education, explanation, and written materials on patient's individual exercise prescription       Expected Outcomes Short Term: Able to explain program exercise prescription;Long Term: Able to explain home exercise prescription to exercise independently                Exercise Goals Re-Evaluation:  Exercise Goals Re-Evaluation     [place] Name 11/21/21 1559 12/21/21 1638 01/02/22 1630 01/13/22 1624       Exercise Goal Re-Evaluation   Exercise Goals Review Increase Physical Activity;Able to understand and use rate of perceived exertion (RPE) scale Increase Physical Activity;Able to understand and use rate of perceived exertion (RPE) scale;Knowledge and understanding of Target Heart Rate Range (THRR);Understanding of Exercise Prescription;Increase Strength and Stamina Increase Physical Activity;Able to understand and use rate of perceived exertion (RPE) scale;Knowledge and understanding of Target Heart Rate Range (THRR);Understanding of Exercise Prescription;Increase Strength and Stamina Increase Physical Activity;Able to understand and use rate of perceived exertion (RPE) scale;Knowledge and understanding of Target Heart Rate Range (THRR);Understanding of Exercise Prescription;Increase Strength and Stamina    Comments Patient able to understand and use RPE scale appropriately. Currently walking 30-45 minutes at home and asked if it was OK to walk 30-45 minutes twice daily. I suggested patient stick to once a day for now. Patiient will walk 30-45 minutes on the days she doesn't attend cardiac rehab. Reviewed MET's, goals and home ExRx. Pt tolerated exercise well with an average MET level of 3.9. Pt will continue to exercise by walking 30 mins 2-4 days when she get her treadmill set up she will use that as well. Pt feels good about her  goals of gaining strength and feels  better about her balance. Pt states she has had issues with her balance since her teens. Reviewed MET's and goals. Pt tolerated exercise well with an average MET level of 4.7. Patient feels much stronger and is happy with her progress so far. Patient will continue to work on balance and weight loss, but encouraged patient on her progress so far. Pt graduated the CRP2 program today. Pt tolerated exercise well with an average MET level of 3.8. Pt felt great about her progress and will continue to exercise by walking and using her treadmill 5-7 days for 30 mins per session    Expected Outcomes Progress workloads as tolerated to help improve cardiorespiratory fitness. Will continue to monitor pt and progress workloads as tolerated without sign or symptom. Pt will continue to exercise on her own 2-4 days Will continue to monitor pt and progress workloads as tolerated without sign or symptom. Pt will continue to exercise on her own and gain strength.             Nutrition & Weight - Outcomes:  Pre Biometrics - 11/17/21 1242       Pre Biometrics   Waist Circumference 36 inches    Hip Circumference 42.25 inches    Waist to Hip Ratio 0.85 %    Triceps Skinfold 20 mm    % Body Fat 40.7 %    Grip Strength 19 kg    Flexibility 20.25 in    Single Leg Stand 1.6 seconds   1.6            Post Biometrics - 01/09/22 1631        Post  Biometrics   Height 4' 11" (1.499 m)    Weight 70 kg    Waist Circumference 37.5 inches    Hip Circumference 44 inches    Waist to Hip Ratio 0.85 %    BMI (Calculated) 31.15    Triceps Skinfold 22 mm    % Body Fat 41.6 %    Grip Strength 22 kg    Flexibility 20.5 in    Single Leg Stand 3 seconds             Nutrition:  Nutrition Therapy & Goals - 01/09/22 1625       Nutrition Therapy   Diet Heart Healthy Diet    Drug/Food Interactions Statins/Certain Fruits      Personal Nutrition Goals   Nutrition Goal Patient  to implement lean protein/plant protein, fruits, vegetables, whole grains, and low fat dairy as part of heart healthy meal plan.    Personal Goal #2 Patient to identify and limit food sources of saturated fat, trans fats, sodium, and refined carbohydrates    Personal Goal #3 Patient to limit sodium to <2300mg per day.    Comments Goals in progress. Patient graduates this weeks. She continues to work toward appropriate portions to aid with weight management. Encouraged follow-up with PCP regarding poor sleep (<6hours/night).      Intervention Plan   Intervention Prescribe, educate and counsel regarding individualized specific dietary modifications aiming towards targeted core components such as weight, hypertension, lipid management, diabetes, heart failure and other comorbidities.    Expected Outcomes Long Term Goal: Adherence to prescribed nutrition plan.;Short Term Goal: A plan has been developed with personal nutrition goals set during dietitian appointment.             Nutrition Discharge:  Nutrition Assessments - 11/22/21 0912       Rate Your Plate Scores     Pre Score 53             Education Questionnaire Score:  Knowledge Questionnaire Score - 01/11/22 1612       Knowledge Questionnaire Score   Post Score 17/24             Goals reviewed with patient; copy given to patient.Pt graduates from  Intensive/Traditional cardiac rehab program on 01/13/22 with completion of  21 exercise and education sessions. Pt maintained good attendance and progressed nicely during their participation in rehab as evidenced by increased MET level.   Medication list reconciled. Repeat  PHQ score- 0 .  Pt has made significant lifestyle changes and should be commended for their success. Katleen achieved their goals during cardiac rehab.   Pt plans to continue exercise at home by walking and using her hand weights. Don increased her distance on hier post exercise walk test by 686 feet.We are proud  of Dawnn's progress!Harrell Gave RN BSN

## 2022-01-13 ENCOUNTER — Encounter (HOSPITAL_COMMUNITY)
Admission: RE | Admit: 2022-01-13 | Discharge: 2022-01-13 | Disposition: A | Discharge status: Home / Self Care | Payer: Medicare Other | Source: Ambulatory Visit | Attending: Internal Medicine | Admitting: Internal Medicine

## 2022-01-13 DIAGNOSIS — Z951 Presence of aortocoronary bypass graft: Secondary | ICD-10-CM

## 2022-01-13 DIAGNOSIS — I214 Non-ST elevation (NSTEMI) myocardial infarction: Secondary | ICD-10-CM

## 2022-01-13 DIAGNOSIS — Z5189 Encounter for other specified aftercare: Secondary | ICD-10-CM | POA: Diagnosis not present

## 2022-01-24 ENCOUNTER — Ambulatory Visit (INDEPENDENT_AMBULATORY_CARE_PROVIDER_SITE_OTHER): Payer: Medicare Other | Admitting: Ophthalmology

## 2022-01-24 ENCOUNTER — Encounter (INDEPENDENT_AMBULATORY_CARE_PROVIDER_SITE_OTHER): Payer: Self-pay | Admitting: Ophthalmology

## 2022-01-24 DIAGNOSIS — I25708 Atherosclerosis of coronary artery bypass graft(s), unspecified, with other forms of angina pectoris: Secondary | ICD-10-CM

## 2022-01-24 DIAGNOSIS — H2513 Age-related nuclear cataract, bilateral: Secondary | ICD-10-CM

## 2022-01-24 DIAGNOSIS — H35371 Puckering of macula, right eye: Secondary | ICD-10-CM

## 2022-01-24 NOTE — Progress Notes (Signed)
01/24/2022     CHIEF COMPLAINT Patient presents for  Chief Complaint  Patient presents with   Retina Evaluation      HISTORY OF PRESENT ILLNESS: Tiffany Odonnell is a 71 y.o. female who presents to the clinic today for:   HPI     Retina Evaluation           Laterality: right eye         Comments   Significant epiretinal membrane Pt states her vision has been stable  Pt admits to floaters Pt states "sometimes when I'm reading I see double or triple lines"  Pt states she was going to get a cataract done on Wednesday but the doctor sent her here"       Last edited by Edmon Crape, MD on 01/24/2022  1:56 PM.      Referring physician: Felix Pacini, FNP [address] [place],  [place] [address]  HISTORICAL INFORMATION:   Selected notes from the MEDICAL RECORD NUMBER    Lab Results  Component Value Date   HGBA1C 5.9 (H) 08/01/2021     CURRENT MEDICATIONS: Current Outpatient Medications (Ophthalmic Drugs)  Medication Sig   Carboxymethylcellulose Sodium (THERATEARS OP) Place 1 drop into both eyes 2 (two) times daily as needed (dry eyes).   No current facility-administered medications for this visit. (Ophthalmic Drugs)   Current Outpatient Medications (Other)  Medication Sig   acetaminophen (TYLENOL) 500 MG tablet Take 1,000 mg by mouth every 6 (six) hours as needed for headache (pain).   Ascorbic Acid (VITAMIN C) 1000 MG tablet Take 2,000 mg by mouth daily as needed (immune support). (Patient not taking: Reported on 01/11/2022)   aspirin EC 81 MG tablet Take 1 tablet (81 mg total) by mouth daily.   atenolol (TENORMIN) 25 MG tablet Take 1 tablet (25 mg total) by mouth daily.   betamethasone dipropionate 0.05 % lotion Apply 1 application topically 2 (two) times daily as needed (scalp rash).   docusate sodium (COLACE) 100 MG capsule Take 1 capsule (100 mg total) by mouth daily as needed for up to 30 doses. (Patient taking differently: Take 100 mg by mouth  daily as needed (constipation).)   ECHINACEA PO Take 2 capsules by mouth daily as needed (immune system boost). (Patient not taking: Reported on 01/11/2022)   hydrocortisone (ANUSOL-HC) 2.5 % rectal cream Place 1 application rectally 3 (three) times daily as needed for hemorrhoids.   indapamide (LOZOL) 2.5 MG tablet Take 2.5 mg by mouth every morning.   MAGNESIUM PO Take 1 tablet by mouth at bedtime as needed (relax nervew). (Patient not taking: Reported on 01/11/2022)   Multiple Vitamins-Minerals (ZINC PO) Take 1 tablet by mouth at bedtime as needed (to relax nerves). (Patient not taking: Reported on 01/11/2022)   Potassium Citrate 15 MEQ (1620 MG) TBCR Take 15 mEq by mouth in the morning and at bedtime.   rosuvastatin (CRESTOR) 20 MG tablet TAKE 1 TABLET EVERY DAY   No current facility-administered medications for this visit. (Other)      REVIEW OF SYSTEMS: ROS   Negative for: Constitutional, Gastrointestinal, Neurological, Skin, Genitourinary, Musculoskeletal, HENT, Endocrine, Cardiovascular, Eyes, Respiratory, Psychiatric, Allergic/Imm, Heme/Lymph Last edited by Aleene Davidson, CMA on 01/24/2022  1:17 PM.       ALLERGIES Allergies  Allergen Reactions   Augmentin [Amoxicillin-Pot Clavulanate] Nausea And Vomiting   Ciprofloxacin Hives   Sulfa Antibiotics Other (See Comments)    Trembling, paralyzing pain, red eyes, skin crawling   Latex  Itching   Macrolides And Ketolides Diarrhea and Nausea And Vomiting   Tetracyclines & Related Other (See Comments) and Tinitus    Vertigo    PAST MEDICAL HISTORY Past Medical History:  Diagnosis Date   Anxiety    Blood in urine    Cervical disc disorder    bulging disc   Coronary artery disease    Diabetes mellitus without complication (HCC)    Dizziness    Falls    Fibromyalgia    H/O Salmonella gastroenteritis    High cholesterol    History of frequent urinary tract infections    History of hiatal hernia    History of vertigo     Hypertension    Imbalance    Kidney stones    Lichen planopilaris    Lumbar herniated disc    L5-L6   Numbness and tingling    hands and feet bilat    Oscillopsia    Peripheral neuropathy    Septicemia (HCC)    2013   Sinusitis    Sjogren's disease (HCC)    Tinnitus    Wears glasses    Past Surgical History:  Procedure Laterality Date   ABDOMINAL HYSTERECTOMY     2014   CARDIAC CATHETERIZATION     CORONARY ARTERY BYPASS GRAFT N/A 08/05/2021   Procedure: CORONARY ARTERY BYPASS GRAFTING (CABG) TIMES THREE, USING LEFT INTERNAL MAMMARTY ARTERY AND ENDOSCOPICALLY HARVESTED RIGHT GREATER SAPHENOUS VEIN;  Surgeon: Alleen Borne, MD;  Location: [facility] OR;  Service: Open Heart Surgery;  Laterality: N/A;   ENDOVEIN HARVEST OF GREATER SAPHENOUS VEIN Right 08/05/2021   Procedure: ENDOVEIN HARVEST OF GREATER SAPHENOUS VEIN;  Surgeon: Alleen Borne, MD;  Location: [facility] OR;  Service: Open Heart Surgery;  Laterality: Right;   IR URETERAL STENT RIGHT NEW ACCESS W/O SEP NEPHROSTOMY CATH  07/15/2021   KNEE ARTHROSCOPY WITH MEDIAL MENISECTOMY Left 10/29/2019   Procedure: KNEE ARTHROSCOPY WITH MEDIAL AND LATERAL MENISECTOMY WITH DEBRIDEMENT;  Surgeon: Jene Every, MD;  Location: [facility] ORS;  Service: Orthopedics;  Laterality: Left;  60 mins   LEFT HEART CATH AND CORONARY ANGIOGRAPHY N/A 08/02/2021   Procedure: LEFT HEART CATH AND CORONARY ANGIOGRAPHY;  Surgeon: Corky Crafts, MD;  Location: [facility] INVASIVE CV LAB;  Service: Cardiovascular;  Laterality: N/A;   NASAL SINUS SURGERY     NEPHROLITHOTOMY Right 05/31/2015   Procedure: RIGHT PERCUTANEOUS NEPHROLITHOTOMY  ;  Surgeon: Ihor Gully, MD;  Location: [facility] ORS;  Service: Urology;  Laterality: Right;   NEPHROLITHOTOMY Right 07/15/2021   Procedure: NEPHROLITHOTOMY PERCUTANEOUS;  Surgeon: Jannifer Hick, MD;  Location: [facility] ORS;  Service: Urology;  Laterality: Right;   prolapsed bladder and rectocele surgery      TEE WITHOUT CARDIOVERSION N/A 08/05/2021    Procedure: TRANSESOPHAGEAL ECHOCARDIOGRAM (TEE);  Surgeon: Alleen Borne, MD;  Location: [facility] OR;  Service: Open Heart Surgery;  Laterality: N/A;   TUBAL LIGATION     1983   URETERAL STENT PLACEMENT     times 2 secondary to kidney stone     FAMILY HISTORY Family History  Problem Relation Age of Onset   CAD Mother    Diabetes Father    Non-Hodgkin's lymphoma Brother    Lupus Other    Hypertension Other    Diabetes Other    Glaucoma Maternal Uncle    Non-Hodgkin's lymphoma Brother    Non-Hodgkin's lymphoma Brother        half brother    SOCIAL HISTORY Social History   Tobacco  Use   Smoking status: Never   Smokeless tobacco: Never  Vaping Use   Vaping Use: Never used  Substance Use Topics   Alcohol use: No   Drug use: No         OPHTHALMIC EXAM:  Base Eye Exam     Visual Acuity (ETDRS)       Right Left   Dist [place] 20/40 20/25         Tonometry (Tonopen, 1:26 PM)       Right Left   Pressure 16 13         Pupils       Pupils Shape APD   Right PERRL Round None   Left PERRL           Visual Fields       Left Right    Full Full         Extraocular Movement       Right Left    Full, Ortho Full, Ortho         Neuro/Psych     Oriented x3: Yes         Dilation     Both eyes: 1.0% Mydriacyl, 2.5% Phenylephrine @ 1:21 PM           Slit Lamp and Fundus Exam     External Exam       Right Left   External Normal Normal         Slit Lamp Exam       Right Left   Lids/Lashes Normal Normal   Conjunctiva/Sclera White and quiet White and quiet   Cornea Clear Clear   Anterior Chamber Deep and quiet Deep and quiet   Iris Round and reactive Round and reactive   Lens 2+ Nuclear sclerosis 2+ Nuclear sclerosis   Anterior Vitreous Normal Normal         Fundus Exam       Right Left   Posterior Vitreous Normal Normal   Disc Normal Normal   C/D Ratio 0.45 0.45   Macula Epiretinal membrane, moderate to severe topographic  distortion Normal   Vessels Normal Normal   Periphery Normal Normal            IMAGING AND PROCEDURES  Imaging and Procedures for 01/24/22  OCT, Retina - OU - Both Eyes       Right Eye Quality was good. Scan locations included subfoveal. Central Foveal Thickness: 438. Progression has no prior data. Findings include abnormal foveal contour, epiretinal membrane.   Left Eye Quality was good. Scan locations included subfoveal. Central Foveal Thickness: 243. Progression has no prior data. Findings include normal foveal contour.      Color Fundus Photography Optos - OU - Both Eyes       Right Eye Progression has no prior data. Disc findings include normal observations. Macula : epiretinal membrane. Vessels : normal observations.   Left Eye Progression has no prior data. Disc findings include normal observations. Macula : normal observations. Vessels : normal observations. Periphery : normal observations.   Notes Moderate topographic distortion the macular cellophane maculopathy appearance, ERM              ASSESSMENT/PLAN:  Age-related nuclear cataract of both eyes Moderate NSC changes, however visually significant hampering ability to perform vitrectomy membrane peel right eye is required.  We will recommend cataract surgery Dr. Jethro Bolus in the near future thereafter followed 1 to 2 weeks later by vitrectomy membrane peel in order to  recover vision in the right eye  Epiretinal membrane (ERM) of right eye Severe thickening and early foveal schisis present in the center of the fovea OD accounting for acuity OD.  However will need cataract surgery with lens implantation in the right eye under direction Dr. Jethro Bolus follow-up 1 to 2 weeks later by vitrectomy membrane peel the right eye to allow for visual acuity stabilization but more importantly recovery OD.     ICD-10-CM   1. Epiretinal membrane (ERM) of right eye  H35.371 OCT, Retina - OU - Both Eyes     Color Fundus Photography Optos - OU - Both Eyes    2. Age-related nuclear cataract of both eyes  H25.13       1.  OU with visually moderate cataracts however darkening of the lens is enough to hamper vitrectomy and membrane peel right eye.  2.  OD needs direct surgery with intraocular lens implantation right eye prior to planned vitrectomy membrane peel right eye  3.  Follow-up Dr. Jethro Bolus followed thereafter by vitrectomy membrane peel as a separate procedure  Ophthalmic Meds Ordered this visit:  No orders of the defined types were placed in this encounter.      Return in about 4 weeks (around 02/21/2022) for dilate, OD, OCT,, and possible preop for vitrectomy membrane peel OD.  There are no Patient Instructions on file for this visit.   Explained the diagnoses, plan, and follow up with the patient and they expressed understanding.  Patient expressed understanding of the importance of proper follow up care.   Alford Highland Legion Discher M.D. Diseases & Surgery of the Retina and Vitreous [facility] 01/24/22     Abbreviations: M myopia (nearsighted); A astigmatism; H hyperopia (farsighted); P presbyopia; Mrx spectacle prescription;  CTL contact lenses; OD right eye; OS left eye; OU both eyes  XT exotropia; ET esotropia; PEK punctate epithelial keratitis; PEE punctate epithelial erosions; DES dry eye syndrome; MGD meibomian gland dysfunction; ATs artificial tears; PFAT's preservative free artificial tears; NSC nuclear sclerotic cataract; PSC posterior subcapsular cataract; ERM epi-retinal membrane; PVD posterior vitreous detachment; RD retinal detachment; DM diabetes mellitus; DR diabetic retinopathy; NPDR non-proliferative diabetic retinopathy; PDR proliferative diabetic retinopathy; CSME clinically significant macular edema; DME diabetic macular edema; dbh dot blot hemorrhages; CWS cotton wool spot; POAG primary open angle glaucoma; C/D cup-to-disc ratio; HVF humphrey  visual field; GVF goldmann visual field; OCT optical coherence tomography; IOP intraocular pressure; BRVO Branch retinal vein occlusion; CRVO central retinal vein occlusion; CRAO central retinal artery occlusion; BRAO branch retinal artery occlusion; RT retinal tear; SB scleral buckle; PPV pars plana vitrectomy; VH Vitreous hemorrhage; PRP panretinal laser photocoagulation; IVK intravitreal kenalog; VMT vitreomacular traction; MH Macular hole;  NVD neovascularization of the disc; NVE neovascularization elsewhere; AREDS age related eye disease study; ARMD age related macular degeneration; POAG primary open angle glaucoma; EBMD epithelial/anterior basement membrane dystrophy; ACIOL anterior chamber intraocular lens; IOL intraocular lens; PCIOL posterior chamber intraocular lens; Phaco/IOL phacoemulsification with intraocular lens placement; PRK photorefractive keratectomy; LASIK laser assisted in situ keratomileusis; HTN hypertension; DM diabetes mellitus; COPD chronic obstructive pulmonary disease

## 2022-01-24 NOTE — Assessment & Plan Note (Signed)
Moderate NSC changes, however visually significant hampering ability to perform vitrectomy membrane peel right eye is required.  We will recommend cataract surgery Dr. Jethro Bolus in the near future thereafter followed 1 to 2 weeks later by vitrectomy membrane peel in order to recover vision in the right eye

## 2022-01-24 NOTE — Assessment & Plan Note (Signed)
Severe thickening and early foveal schisis present in the center of the fovea OD accounting for acuity OD.  However will need cataract surgery with lens implantation in the right eye under direction Dr. Jethro Bolus follow-up 1 to 2 weeks later by vitrectomy membrane peel the right eye to allow for visual acuity stabilization but more importantly recovery OD.

## 2022-01-26 ENCOUNTER — Encounter (INDEPENDENT_AMBULATORY_CARE_PROVIDER_SITE_OTHER): Payer: Self-pay

## 2022-02-01 ENCOUNTER — Encounter (INDEPENDENT_AMBULATORY_CARE_PROVIDER_SITE_OTHER): Payer: Self-pay

## 2022-02-13 ENCOUNTER — Ambulatory Visit (INDEPENDENT_AMBULATORY_CARE_PROVIDER_SITE_OTHER): Payer: Medicare Other | Admitting: Internal Medicine

## 2022-02-13 ENCOUNTER — Encounter: Payer: Self-pay | Admitting: Internal Medicine

## 2022-02-13 ENCOUNTER — Ambulatory Visit: Payer: Medicare Other | Admitting: Internal Medicine

## 2022-02-13 VITALS — BP 142/88 | HR 55 | Ht 61.0 in | Wt 157.2 lb

## 2022-02-13 DIAGNOSIS — Z951 Presence of aortocoronary bypass graft: Secondary | ICD-10-CM

## 2022-02-13 DIAGNOSIS — I25708 Atherosclerosis of coronary artery bypass graft(s), unspecified, with other forms of angina pectoris: Secondary | ICD-10-CM

## 2022-02-13 DIAGNOSIS — E785 Hyperlipidemia, unspecified: Secondary | ICD-10-CM

## 2022-02-13 DIAGNOSIS — I9789 Other postprocedural complications and disorders of the circulatory system, not elsewhere classified: Secondary | ICD-10-CM | POA: Diagnosis not present

## 2022-02-13 DIAGNOSIS — I4891 Unspecified atrial fibrillation: Secondary | ICD-10-CM

## 2022-02-13 DIAGNOSIS — I10 Essential (primary) hypertension: Secondary | ICD-10-CM

## 2022-02-13 DIAGNOSIS — R001 Bradycardia, unspecified: Secondary | ICD-10-CM | POA: Diagnosis not present

## 2022-02-13 LAB — COMPREHENSIVE METABOLIC PANEL
ALT: 22 IU/L (ref 0–32)
AST: 19 IU/L (ref 0–40)
Albumin/Globulin Ratio: 1.5 (ref 1.2–2.2)
Albumin: 4.4 g/dL (ref 3.8–4.8)
Alkaline Phosphatase: 77 IU/L (ref 44–121)
BUN/Creatinine Ratio: 27 (ref 12–28)
BUN: 28 mg/dL — ABNORMAL HIGH (ref 8–27)
Bilirubin Total: 0.5 mg/dL (ref 0.0–1.2)
CO2: 27 mmol/L (ref 20–29)
Calcium: 9.7 mg/dL (ref 8.7–10.3)
Chloride: 98 mmol/L (ref 96–106)
Creatinine, Ser: 1.05 mg/dL — ABNORMAL HIGH (ref 0.57–1.00)
Globulin, Total: 2.9 g/dL (ref 1.5–4.5)
Glucose: 119 mg/dL — ABNORMAL HIGH (ref 70–99)
Potassium: 5.2 mmol/L (ref 3.5–5.2)
Sodium: 137 mmol/L (ref 134–144)
Total Protein: 7.3 g/dL (ref 6.0–8.5)
eGFR: 57 mL/min/{1.73_m2} — ABNORMAL LOW (ref >59)

## 2022-02-13 LAB — LIPID PANEL
Chol/HDL Ratio: 2.2 ratio (ref 0.0–4.4)
Cholesterol, Total: 157 mg/dL (ref 100–199)
HDL: 70 mg/dL (ref >39)
LDL Chol Calc (NIH): 69 mg/dL (ref 0–99)
Triglycerides: 98 mg/dL (ref 0–149)
VLDL Cholesterol Cal: 18 mg/dL (ref 5–40)

## 2022-02-13 NOTE — Patient Instructions (Signed)
Medication Instructions:  The current medical regimen is effective;  continue present plan and medications.  *If you need a refill on your cardiac medications before your next appointment, please call your pharmacy*   Lab Work: LIPID, CMET today   If you have labs (blood work) drawn today and your tests are completely normal, you will receive your results only by: MyChart Message (if you have MyChart) OR A paper copy in the mail If you have any lab test that is abnormal or we need to change your treatment, we will call you to review the results.   Follow-Up: At [facility], you and your health needs are our priority.  As part of our continuing mission to provide you with exceptional heart care, we have created designated Provider Care Teams.  These Care Teams include your primary Cardiologist (physician) and Advanced Practice Providers (APPs -  Physician Assistants and Nurse Practitioners) who all work together to provide you with the care you need, when you need it.  We recommend signing up for the patient portal called "MyChart".  Sign up information is provided on this After Visit Summary.  MyChart is used to connect with patients for Virtual Visits (Telemedicine).  Patients are able to view lab/test results, encounter notes, upcoming appointments, etc.  Non-urgent messages can be sent to your provider as well.   To learn more about what you can do with MyChart, go to [email or web address].    Your next appointment:   12 month(s)  The format for your next appointment:   In Person  Provider:   Chrystie Nose, MD {

## 2022-02-13 NOTE — Progress Notes (Signed)
OFFICE NOTE  Chief Complaint:  Follow-up CABG  Primary Care Physician: Felix Pacini, FNP  HPI:  Tiffany Odonnell is a 71 y.o. female with a past medial history significant for with a hx of CAD s/p CABG, HTN, HLD, fibromyalgia, DMII, Sjogren's last seen 09/19/21.  Presented to the ED 08/01/21 with chest pain. HS toponin 73 ? 433. Echo 08/02/21 LVEF 60-65%, gr1DD. Cardiac catheterization 08/03/21 with pLAD 75% stenosed, prox-mid LAD 100% stenosis, right to left collaterals, 1st diagonal 95% stenosed. On 08/05/21 underwent CABG x3. She developed postoperative atrial fibrillation and converted with Amiodarone.   Seen 08/31/21 by cardiothoracic surgeon doing well. Plavix discontinued due to nosebleed and Aspirin continued. Amiodarone decreased to 200mg  daily.   Seen in cardiology clinic 09/19/21 doing well, walking 1-2 blocks without dyspnea. Still with some shoulder and neck pain. No changes were made at that time.  Contacted the office 10/24/21 noting heart rate 49 bpm with feeling faint, lightheaded. Bp at that time 130/78. A few minutes later BP 143/77 and HR 60 bpm.   She presents today for follow up with her daughter. Ple[place] lady [place]. Tells me two days ago symptoms started. She felt very jittery. That morning she had gotten up to go for a walk. During her walk felt she wasn't focusing well. Tells me she felt "off" described as nervous, lightheaded. She has not had dizziness or sensation of room spinning. She feels more lightheaded as if she needs to sit down. Tells me her balance feels affected - it was not good prior to surgery but feels worse since surgery. Notes her tinnitus has also increased since her surgery.  02/13/2022  Tiffany Odonnell returns today for follow-up of CABG.  She had recently been having some bradycardia.  She wore a monitor for 2 weeks which showed some nocturnal bradycardia with heart rate in the 30s but generally in the mid 50s with  no significant arrhythmias.  She was taken off of amiodarone and her atenolol was decreased from 50 to 25 mg daily.  Heart rate remains in the 50s.  She is walking and does cardiac rehab.  She is noted some shortness of breath with exertion but no chest pain.  Blood pressure was a little elevated today at 145/88.  I repeated that it was 142/88.  She says her home blood pressures are in the 1 10-1 20 range.  She reports having some whitecoat hypertension.  She has not followed up with her PCP since surgery.  She has reported some hot flashes.  She reports somewhat of a stiff neck.  She had eye surgery last week.  She has not had repeat lipids since January when she had presented for cardiac catheterization.  She is on 20 mg rosuvastatin.  She also had an increase in her creatinine up to 1.4 but is struggled with recurrent nephrolithiasis.  She will need repeat renal panel as well.  PMHx:  Past Medical History:  Diagnosis Date   Anxiety    Blood in urine    Cervical disc disorder    bulging disc   Coronary artery disease    Diabetes mellitus without complication (HCC)    Dizziness    Falls    Fibromyalgia    H/O Salmonella gastroenteritis    High cholesterol    History of frequent urinary tract infections    History of hiatal hernia    History of vertigo    Hypertension    Imbalance  Kidney stones    Lichen planopilaris    Lumbar herniated disc    L5-L6   Numbness and tingling    hands and feet bilat    Oscillopsia    Peripheral neuropathy    Septicemia (HCC)    2013   Sinusitis    Sjogren's disease (HCC)    Tinnitus    Wears glasses     Past Surgical History:  Procedure Laterality Date   ABDOMINAL HYSTERECTOMY     2014   CARDIAC CATHETERIZATION     CORONARY ARTERY BYPASS GRAFT N/A 08/05/2021   Procedure: CORONARY ARTERY BYPASS GRAFTING (CABG) TIMES THREE, USING LEFT INTERNAL MAMMARTY ARTERY AND ENDOSCOPICALLY HARVESTED RIGHT GREATER SAPHENOUS VEIN;  Surgeon: Alleen Borne, MD;  Location: [facility] OR;  Service: Open Heart Surgery;  Laterality: N/A;   ENDOVEIN HARVEST OF GREATER SAPHENOUS VEIN Right 08/05/2021   Procedure: ENDOVEIN HARVEST OF GREATER SAPHENOUS VEIN;  Surgeon: Alleen Borne, MD;  Location: [facility] OR;  Service: Open Heart Surgery;  Laterality: Right;   IR URETERAL STENT RIGHT NEW ACCESS W/O SEP NEPHROSTOMY CATH  07/15/2021   KNEE ARTHROSCOPY WITH MEDIAL MENISECTOMY Left 10/29/2019   Procedure: KNEE ARTHROSCOPY WITH MEDIAL AND LATERAL MENISECTOMY WITH DEBRIDEMENT;  Surgeon: Jene Every, MD;  Location: [facility]S;  Service: Orthopedics;  Laterality: Left;  60 mins   LEFT HEART CATH AND CORONARY ANGIOGRAPHY N/A 08/02/2021   Procedure: LEFT HEART CATH AND CORONARY ANGIOGRAPHY;  Surgeon: Corky Crafts, MD;  Location: [facility] INVASIVE CV LAB;  Service: Cardiovascular;  Laterality: N/A;   NASAL SINUS SURGERY     NEPHROLITHOTOMY Right 05/31/2015   Procedure: RIGHT PERCUTANEOUS NEPHROLITHOTOMY  ;  Surgeon: Ihor Gully, MD;  Location: [facility];  Service: Urology;  Laterality: Right;   NEPHROLITHOTOMY Right 07/15/2021   Procedure: NEPHROLITHOTOMY PERCUTANEOUS;  Surgeon: Jannifer Hick, MD;  Location: [facility];  Service: Urology;  Laterality: Right;   prolapsed bladder and rectocele surgery      TEE WITHOUT CARDIOVERSION N/A 08/05/2021   Procedure: TRANSESOPHAGEAL ECHOCARDIOGRAM (TEE);  Surgeon: Alleen Borne, MD;  Location: [facility] OR;  Service: Open Heart Surgery;  Laterality: N/A;   TUBAL LIGATION     1983   URETERAL STENT PLACEMENT     times 2 secondary to kidney stone     FAMHx:  Family History  Problem Relation Age of Onset   CAD Mother    Diabetes Father    Non-Hodgkin's lymphoma Brother    Lupus Other    Hypertension Other    Diabetes Other    Glaucoma Maternal Uncle    Non-Hodgkin's lymphoma Brother    Non-Hodgkin's lymphoma Brother        half brother    SOCHx:   reports that she has never smoked. She has never used smokeless tobacco. She reports  that she does not drink alcohol and does not use drugs.  ALLERGIES:  Allergies  Allergen Reactions   Augmentin [Amoxicillin-Pot Clavulanate] Nausea And Vomiting   Ciprofloxacin Hives   Sulfa Antibiotics Other (See Comments)    Trembling, paralyzing pain, red eyes, skin crawling   Latex Itching   Macrolides And Ketolides Diarrhea and Nausea And Vomiting   Tetracyclines & Related Other (See Comments) and Tinitus    Vertigo    ROS: Pertinent items noted in HPI and remainder of comprehensive ROS otherwise negative.  HOME MEDS: Current Outpatient Medications on File Prior to Visit  Medication Sig Dispense Refill   acetaminophen (TYLENOL) 500 MG tablet Take  1,000 mg by mouth every 6 (six) hours as needed for headache (pain).     Ascorbic Acid (VITAMIN C) 1000 MG tablet Take 2,000 mg by mouth daily as needed (immune support).     aspirin EC 81 MG tablet Take 1 tablet (81 mg total) by mouth daily. 14 tablet 1   atenolol (TENORMIN) 25 MG tablet Take 1 tablet (25 mg total) by mouth daily. 90 tablet 3   betamethasone dipropionate 0.05 % lotion Apply 1 application topically 2 (two) times daily as needed (scalp rash).  2   Carboxymethylcellulose Sodium (THERATEARS OP) Place 1 drop into both eyes 2 (two) times daily as needed (dry eyes).     hydrocortisone (ANUSOL-HC) 2.5 % rectal cream Place 1 application rectally 3 (three) times daily as needed for hemorrhoids.     indapamide (LOZOL) 2.5 MG tablet Take 2.5 mg by mouth every morning.     ketorolac (ACULAR) 0.5 % ophthalmic solution Instill 1 drop TID in operative eye(s) starting 2 days prior to surgery and 3 weeks following surgery.     Multiple Vitamins-Minerals (ZINC PO) Take 1 tablet by mouth at bedtime as needed (to relax nerves).     OVER THE COUNTER MEDICATION LIV PURE     Potassium Citrate 15 MEQ (1620 MG) TBCR Take 15 mEq by mouth in the morning and at bedtime.     PREBIOTIC PRODUCT PO Take by mouth.     prednisoLONE acetate (PRED FORTE)  1 % ophthalmic suspension Instill 1 drop TID in operative eye(s) starting 2 days prior to surgery and 3 weeks following surgery     rosuvastatin (CRESTOR) 20 MG tablet TAKE 1 TABLET EVERY DAY 30 tablet 10   tobramycin (TOBREX) 0.3 % ophthalmic solution Instill 1 drop TID in operative eye(s) starting 2 days prior to surgery and 3 weeks following surgery.     MAGNESIUM PO Take 1 tablet by mouth at bedtime as needed (relax nervew). (Patient not taking: Reported on 02/13/2022)     No current facility-administered medications on file prior to visit.    LABS/IMAGING: No results found for this or any previous visit (from the past 48 hour(s)). No results found.  LIPID PANEL:    Component Value Date/Time   CHOL 222 (H) 08/02/2021 0400   TRIG 98 08/02/2021 0400   HDL 56 08/02/2021 0400   CHOLHDL 4.0 08/02/2021 0400   VLDL 20 08/02/2021 0400   LDLCALC 146 (H) 08/02/2021 0400     WEIGHTS: Wt Readings from Last 3 Encounters:  02/13/22 157 lb 3.2 oz (71.3 kg)  01/09/22 154 lb 5.2 oz (70 kg)  11/17/21 155 lb 4.8 oz (70.4 kg)    VITALS: BP (!) 142/88   Pulse (!) 55   Ht 5\' 1"  (1.549 m)   Wt 157 lb 3.2 oz (71.3 kg)   SpO2 99%   BMI 29.70 kg/m   EXAM: General appearance: alert and no distress Neck: no carotid bruit, no JVD, and thyroid not enlarged, symmetric, no tenderness/mass/nodules Lungs: clear to auscultation bilaterally Heart: regular rate and rhythm, S1, S2 normal, no murmur, click, rub or gallop Abdomen: soft, non-tender; bowel sounds normal; no masses,  no organomegaly Extremities: extremities normal, atraumatic, no cyanosis or edema Pulses: 2+ and symmetric Skin: Skin color, texture, turgor normal. No rashes or lesions Neurologic: Grossly normal Psych: Pleasant  EKG: Sinus bradycardia at 55- personally reviewed  ASSESSMENT: CAD status post CABG x3 for NSTEMI in January 2023 (LIMA to LAD, SVG to diagonal, SVG to  PDA) -Dr. Laneta Simmers Postoperative atrial fibrillation Type 2  diabetes Dyslipidemia, goal LDL less than 70 Hypertension History of Sjogren's disease Peripheral neuropathy Recurrent nephrolithiasis  PLAN: 1.   Tiffany Odonnell returns today for follow-up.  She has had some bradycardia which has not significantly improved after stopping amiodarone and decreasing her atenolol.  Heart rates in the 50s but I am not sure she is symptomatic with this.  Monitoring was reassuring.  She needs repeat lipids since it has not been reassessed after starting 20 mg rosuvastatin daily.  Her target LDL is less than 70.  She is done overall very well after three-vessel bypass earlier this year.  No evidence of recurrent A-fib.  She did have some recent acute kidney injury with creatinine 1.4.  She has had recurrent nephrolithiasis.  We will plan to repeat comprehensive metabolic panel and lipid profile today.  Will adjust medications accordingly.  HYPERTENSION CONTROL Vitals:   02/13/22 0846 02/13/22 0928  BP: (!) 145/88 (!) 142/88    The patient's blood pressure is elevated above target today.  In order to address the patient's elevated BP: The blood pressure is usually elevated in clinic.  Blood pressures monitored at home have been optimal.      Chrystie Nose, MD, [facility], FACP  [place]  [facility]  Medical Director of the Advanced Lipid Disorders &  Cardiovascular Risk Reduction Clinic Diplomate of the American Board of Clinical Lipidology Attending Cardiologist  Direct Dial: [phone]  Fax: [phone]  Website:  [email or web address] 02/13/2022, 9:28 AM

## 2022-02-16 ENCOUNTER — Encounter (INDEPENDENT_AMBULATORY_CARE_PROVIDER_SITE_OTHER): Payer: Medicare Other | Admitting: Ophthalmology

## 2022-02-21 ENCOUNTER — Encounter: Payer: Self-pay | Admitting: Internal Medicine

## 2022-02-21 ENCOUNTER — Encounter (INDEPENDENT_AMBULATORY_CARE_PROVIDER_SITE_OTHER): Payer: Medicare Other | Admitting: Ophthalmology

## 2022-03-15 ENCOUNTER — Ambulatory Visit (INDEPENDENT_AMBULATORY_CARE_PROVIDER_SITE_OTHER): Payer: Medicare Other | Admitting: Ophthalmology

## 2022-03-15 ENCOUNTER — Encounter (INDEPENDENT_AMBULATORY_CARE_PROVIDER_SITE_OTHER): Payer: Self-pay | Admitting: Ophthalmology

## 2022-03-15 DIAGNOSIS — H43811 Vitreous degeneration, right eye: Secondary | ICD-10-CM

## 2022-03-15 DIAGNOSIS — I25708 Atherosclerosis of coronary artery bypass graft(s), unspecified, with other forms of angina pectoris: Secondary | ICD-10-CM | POA: Diagnosis not present

## 2022-03-15 DIAGNOSIS — H35371 Puckering of macula, right eye: Secondary | ICD-10-CM | POA: Diagnosis not present

## 2022-03-15 NOTE — Assessment & Plan Note (Signed)
I explained to the patient that vitrectomy for the membrane peel will likely remove this floater and all floaters in the right eye

## 2022-03-15 NOTE — Progress Notes (Signed)
03/15/2022     CHIEF COMPLAINT Patient presents for  Chief Complaint  Patient presents with   Retina Evaluation      HISTORY OF PRESENT ILLNESS: Tiffany Odonnell is a 71 y.o. female who presents to the clinic today for:   HPI     Retina Evaluation           Laterality: right eye         Comments   Epiretinal membrane right eye.  Had recent cataract surgery to clear the visual axis. 4 weeks dilate od oct (possible pre op for vitrectomy membrane peel od) Pt states her vision has been stable Pt denies FOL Pt complains of a spider like floater in her left lateral eye that is really bothering her      Last edited by Edmon Crape, MD on 03/15/2022 10:32 AM.      Referring physician: Jethro Bolus, MD [address] [place],  [place] [address]  HISTORICAL INFORMATION:   Selected notes from the MEDICAL RECORD NUMBER    Lab Results  Component Value Date   HGBA1C 5.9 (H) 08/01/2021     CURRENT MEDICATIONS: Current Outpatient Medications (Ophthalmic Drugs)  Medication Sig   Carboxymethylcellulose Sodium (THERATEARS OP) Place 1 drop into both eyes 2 (two) times daily as needed (dry eyes).   ketorolac (ACULAR) 0.5 % ophthalmic solution Instill 1 drop TID in operative eye(s) starting 2 days prior to surgery and 3 weeks following surgery.   prednisoLONE acetate (PRED FORTE) 1 % ophthalmic suspension Instill 1 drop TID in operative eye(s) starting 2 days prior to surgery and 3 weeks following surgery   tobramycin (TOBREX) 0.3 % ophthalmic solution Instill 1 drop TID in operative eye(s) starting 2 days prior to surgery and 3 weeks following surgery.   No current facility-administered medications for this visit. (Ophthalmic Drugs)   Current Outpatient Medications (Other)  Medication Sig   acetaminophen (TYLENOL) 500 MG tablet Take 1,000 mg by mouth every 6 (six) hours as needed for headache (pain).   Ascorbic Acid (VITAMIN C) 1000 MG tablet Take 2,000 mg by mouth daily  as needed (immune support).   aspirin EC 81 MG tablet Take 1 tablet (81 mg total) by mouth daily.   atenolol (TENORMIN) 25 MG tablet Take 1 tablet (25 mg total) by mouth daily.   betamethasone dipropionate 0.05 % lotion Apply 1 application topically 2 (two) times daily as needed (scalp rash).   hydrocortisone (ANUSOL-HC) 2.5 % rectal cream Place 1 application rectally 3 (three) times daily as needed for hemorrhoids.   indapamide (LOZOL) 2.5 MG tablet Take 2.5 mg by mouth every morning.   MAGNESIUM PO Take 1 tablet by mouth at bedtime as needed (relax nervew). (Patient not taking: Reported on 02/13/2022)   Multiple Vitamins-Minerals (ZINC PO) Take 1 tablet by mouth at bedtime as needed (to relax nerves).   OVER THE COUNTER MEDICATION LIV PURE   Potassium Citrate 15 MEQ (1620 MG) TBCR Take 15 mEq by mouth in the morning and at bedtime.   PREBIOTIC PRODUCT PO Take by mouth.   rosuvastatin (CRESTOR) 20 MG tablet TAKE 1 TABLET EVERY DAY   No current facility-administered medications for this visit. (Other)      REVIEW OF SYSTEMS: ROS   Negative for: Constitutional, Gastrointestinal, Neurological, Skin, Genitourinary, Musculoskeletal, HENT, Endocrine, Cardiovascular, Eyes, Respiratory, Psychiatric, Allergic/Imm, Heme/Lymph Last edited by Aleene Davidson, CMA on 03/15/2022  9:37 AM.       ALLERGIES Allergies  Allergen  Reactions   Augmentin [Amoxicillin-Pot Clavulanate] Nausea And Vomiting   Ciprofloxacin Hives   Sulfa Antibiotics Other (See Comments)    Trembling, paralyzing pain, red eyes, skin crawling   Latex Itching   Macrolides And Ketolides Diarrhea and Nausea And Vomiting   Tetracyclines & Related Other (See Comments) and Tinitus    Vertigo    PAST MEDICAL HISTORY Past Medical History:  Diagnosis Date   Anxiety    Blood in urine    Cervical disc disorder    bulging disc   Coronary artery disease    Diabetes mellitus without complication (HCC)    Dizziness    Falls     Fibromyalgia    H/O Salmonella gastroenteritis    High cholesterol    History of frequent urinary tract infections    History of hiatal hernia    History of vertigo    Hypertension    Imbalance    Kidney stones    Lichen planopilaris    Lumbar herniated disc    L5-L6   Numbness and tingling    hands and feet bilat    Oscillopsia    Peripheral neuropathy    Septicemia (HCC)    2013   Sinusitis    Sjogren's disease (HCC)    Tinnitus    Wears glasses    Past Surgical History:  Procedure Laterality Date   ABDOMINAL HYSTERECTOMY     2014   CARDIAC CATHETERIZATION     CORONARY ARTERY BYPASS GRAFT N/A 08/05/2021   Procedure: CORONARY ARTERY BYPASS GRAFTING (CABG) TIMES THREE, USING LEFT INTERNAL MAMMARTY ARTERY AND ENDOSCOPICALLY HARVESTED RIGHT GREATER SAPHENOUS VEIN;  Surgeon: Alleen Borne, MD;  Location: [facility] OR;  Service: Open Heart Surgery;  Laterality: N/A;   ENDOVEIN HARVEST OF GREATER SAPHENOUS VEIN Right 08/05/2021   Procedure: ENDOVEIN HARVEST OF GREATER SAPHENOUS VEIN;  Surgeon: Alleen Borne, MD;  Location: [facility] OR;  Service: Open Heart Surgery;  Laterality: Right;   IR URETERAL STENT RIGHT NEW ACCESS W/O SEP NEPHROSTOMY CATH  07/15/2021   KNEE ARTHROSCOPY WITH MEDIAL MENISECTOMY Left 10/29/2019   Procedure: KNEE ARTHROSCOPY WITH MEDIAL AND LATERAL MENISECTOMY WITH DEBRIDEMENT;  Surgeon: Jene Every, MD;  Location: [facility];  Service: Orthopedics;  Laterality: Left;  60 mins   LEFT HEART CATH AND CORONARY ANGIOGRAPHY N/A 08/02/2021   Procedure: LEFT HEART CATH AND CORONARY ANGIOGRAPHY;  Surgeon: Corky Crafts, MD;  Location: [facility] INVASIVE CV LAB;  Service: Cardiovascular;  Laterality: N/A;   NASAL SINUS SURGERY     NEPHROLITHOTOMY Right 05/31/2015   Procedure: RIGHT PERCUTANEOUS NEPHROLITHOTOMY  ;  Surgeon: Ihor Gully, MD;  Location: [facility];  Service: Urology;  Laterality: Right;   NEPHROLITHOTOMY Right 07/15/2021   Procedure: NEPHROLITHOTOMY PERCUTANEOUS;   Surgeon: Jannifer Hick, MD;  Location: [facility];  Service: Urology;  Laterality: Right;   prolapsed bladder and rectocele surgery      TEE WITHOUT CARDIOVERSION N/A 08/05/2021   Procedure: TRANSESOPHAGEAL ECHOCARDIOGRAM (TEE);  Surgeon: Alleen Borne, MD;  Location: [facility] OR;  Service: Open Heart Surgery;  Laterality: N/A;   TUBAL LIGATION     1983   URETERAL STENT PLACEMENT     times 2 secondary to kidney stone     FAMILY HISTORY Family History  Problem Relation Age of Onset   CAD Mother    Diabetes Father    Non-Hodgkin's lymphoma Brother    Lupus Other    Hypertension Other    Diabetes Other    Glaucoma  Maternal Uncle    Non-Hodgkin's lymphoma Brother    Non-Hodgkin's lymphoma Brother        half brother    SOCIAL HISTORY Social History   Tobacco Use   Smoking status: Never   Smokeless tobacco: Never  Vaping Use   Vaping Use: Never used  Substance Use Topics   Alcohol use: No   Drug use: No         OPHTHALMIC EXAM:  Base Eye Exam     Visual Acuity (ETDRS)       Right Left   Dist [place] 20/40 20/25 -1         Tonometry (Tonopen, 9:41 AM)       Right Left   Pressure 9 12         Extraocular Movement       Right Left    Ortho Ortho    -- -- --  --  --  -- -- --   -- -- --  --  --  -- -- --           Neuro/Psych     Oriented x3: Yes         Dilation     Right eye: 2.5% Phenylephrine, 1.0% Mydriacyl @ 9:38 AM           Slit Lamp and Fundus Exam     External Exam       Right Left   External Normal Normal         Slit Lamp Exam       Right Left   Lids/Lashes Normal Normal   Conjunctiva/Sclera White and quiet White and quiet   Cornea Clear Clear   Anterior Chamber Deep and quiet Deep and quiet   Iris Round and reactive Round and reactive   Lens Centered posterior chamber intraocular lens, clear PC 2+ Nuclear sclerosis   Anterior Vitreous Normal Normal         Fundus Exam       Right Left   Posterior Vitreous  Posterior vitreous detachment Normal   Disc Normal Normal   C/D Ratio 0.45 0.45   Macula Epiretinal membrane, moderate to severe topographic distortion Normal   Vessels Normal Normal   Periphery Normal Normal            IMAGING AND PROCEDURES  Imaging and Procedures for 03/15/22  OCT, Retina - OU - Both Eyes       Right Eye Quality was good. Scan locations included subfoveal. Central Foveal Thickness: 432. Progression has been stable. Findings include abnormal foveal contour, epiretinal membrane.   Left Eye Quality was good. Scan locations included subfoveal. Central Foveal Thickness: 243. Progression has been stable. Findings include normal foveal contour.              ASSESSMENT/PLAN:  Epiretinal membrane (ERM) of right eye The nature of macular pucker (epiretinal membrane ERM) was discussed with the patient as well as threshold criteria for vitrectomy surgery. I explained that in rare cases another surgery is needed to actually remove a second wrinkle should it regrow.  Most often, the epiretinal membrane and underlying wrinkled internal limiting membrane are removed with the first surgery, to accomplish the goals.   If the operative eye is [place] (natural lens still present), cataract surgery is often recommended prior to Vitrectomy. This will enable the retina surgeon to have the best view during surgery and the patient to obtain optimal results in the future. Treatment options were discussed.  I have recommended at home monitoring the near vision task in a monocular (1 eye at a time), with or without near vision glasses, to look for changes or declines in reading.  OD requires vitrectomy membrane peel OD to improve and enhance visual acuity and maximize vision.  Posterior vitreous detachment of right eye I explained to the patient that vitrectomy for the membrane peel will likely remove this floater and all floaters in the right eye     ICD-10-CM   1. Epiretinal  membrane (ERM) of right eye  H35.371 OCT, Retina - OU - Both Eyes    2. Posterior vitreous detachment of right eye  H43.811       1.  OD.  Improved with clearance of visual axis post cataract surgery right eye looks great.  2.  Still with visually significant epiretinal membrane.  Requires vitrectomy membrane peel.  Risk benefits reviewed again with the patient.  3.  Explained the patient we are booking into the mid to late October 2023  Ophthalmic Meds Ordered this visit:  No orders of the defined types were placed in this encounter.      Return , [facility], for Schedule mid-to-late October vitrectomy membrane peel, OD, [id number].  There are no Patient Instructions on file for this visit.   Explained the diagnoses, plan, and follow up with the patient and they expressed understanding.  Patient expressed understanding of the importance of proper follow up care.   Alford Highland Yasuo Phimmasone M.D. Diseases & Surgery of the Retina and Vitreous [facility] 03/15/22     Abbreviations: M myopia (nearsighted); A astigmatism; H hyperopia (farsighted); P presbyopia; Mrx spectacle prescription;  CTL contact lenses; OD right eye; OS left eye; OU both eyes  XT exotropia; ET esotropia; PEK punctate epithelial keratitis; PEE punctate epithelial erosions; DES dry eye syndrome; MGD meibomian gland dysfunction; ATs artificial tears; PFAT's preservative free artificial tears; NSC nuclear sclerotic cataract; PSC posterior subcapsular cataract; ERM epi-retinal membrane; PVD posterior vitreous detachment; RD retinal detachment; DM diabetes mellitus; DR diabetic retinopathy; NPDR non-proliferative diabetic retinopathy; PDR proliferative diabetic retinopathy; CSME clinically significant macular edema; DME diabetic macular edema; dbh dot blot hemorrhages; CWS cotton wool spot; POAG primary open angle glaucoma; C/D cup-to-disc ratio; HVF humphrey visual field; GVF goldmann visual field; OCT  optical coherence tomography; IOP intraocular pressure; BRVO Branch retinal vein occlusion; CRVO central retinal vein occlusion; CRAO central retinal artery occlusion; BRAO branch retinal artery occlusion; RT retinal tear; SB scleral buckle; PPV pars plana vitrectomy; VH Vitreous hemorrhage; PRP panretinal laser photocoagulation; IVK intravitreal kenalog; VMT vitreomacular traction; MH Macular hole;  NVD neovascularization of the disc; NVE neovascularization elsewhere; AREDS age related eye disease study; ARMD age related macular degeneration; POAG primary open angle glaucoma; EBMD epithelial/anterior basement membrane dystrophy; ACIOL anterior chamber intraocular lens; IOL intraocular lens; PCIOL posterior chamber intraocular lens; Phaco/IOL phacoemulsification with intraocular lens placement; PRK photorefractive keratectomy; LASIK laser assisted in situ keratomileusis; HTN hypertension; DM diabetes mellitus; COPD chronic obstructive pulmonary disease

## 2022-03-15 NOTE — Assessment & Plan Note (Signed)
The nature of macular pucker (epiretinal membrane ERM) was discussed with the patient as well as threshold criteria for vitrectomy surgery. I explained that in rare cases another surgery is needed to actually remove a second wrinkle should it regrow.  Most often, the epiretinal membrane and underlying wrinkled internal limiting membrane are removed with the first surgery, to accomplish the goals.   If the operative eye is [place] (natural lens still present), cataract surgery is often recommended prior to Vitrectomy. This will enable the retina surgeon to have the best view during surgery and the patient to obtain optimal results in the future. Treatment options were discussed.  I have recommended at home monitoring the near vision task in a monocular (1 eye at a time), with or without near vision glasses, to look for changes or declines in reading.  OD requires vitrectomy membrane peel OD to improve and enhance visual acuity and maximize vision.

## 2022-04-03 ENCOUNTER — Ambulatory Visit (INDEPENDENT_AMBULATORY_CARE_PROVIDER_SITE_OTHER): Payer: Medicare Other

## 2022-04-24 ENCOUNTER — Ambulatory Visit (INDEPENDENT_AMBULATORY_CARE_PROVIDER_SITE_OTHER): Payer: Medicare Other

## 2022-04-27 ENCOUNTER — Encounter (INDEPENDENT_AMBULATORY_CARE_PROVIDER_SITE_OTHER): Payer: Medicare Other | Admitting: Ophthalmology

## 2022-05-01 ENCOUNTER — Ambulatory Visit (INDEPENDENT_AMBULATORY_CARE_PROVIDER_SITE_OTHER): Payer: Medicare Other | Admitting: Podiatry

## 2022-05-01 DIAGNOSIS — B351 Tinea unguium: Secondary | ICD-10-CM | POA: Diagnosis not present

## 2022-05-01 NOTE — Progress Notes (Signed)
Chief Complaint  Patient presents with   Nail Problem    Pt is concerned about nail fungus     Subjective: 71 y.o. female presenting today as a new patient for evaluation of discoloration with nail thickening to the left great toenail.  Patient states that this has been ongoing for several months.  She is not a candidate for oral medication so her PCP since.  She presents for further treatment and evaluation  Past Medical History:  Diagnosis Date   Anxiety    Blood in urine    Cervical disc disorder    bulging disc   Coronary artery disease    Diabetes mellitus without complication (HCC)    Dizziness    Falls    Fibromyalgia    H/O Salmonella gastroenteritis    High cholesterol    History of frequent urinary tract infections    History of hiatal hernia    History of vertigo    Hypertension    Imbalance    Kidney stones    Lichen planopilaris    Lumbar herniated disc    L5-L6   Numbness and tingling    hands and feet bilat    Oscillopsia    Peripheral neuropathy    Septicemia ([place])    2013   Sinusitis    Sjogren's disease ([place])    Tinnitus    Wears glasses     Past Surgical History:  Procedure Laterality Date   ABDOMINAL HYSTERECTOMY     2014   CARDIAC CATHETERIZATION     CORONARY ARTERY BYPASS GRAFT N/A 08/05/2021   Procedure: CORONARY ARTERY BYPASS GRAFTING (CABG) TIMES THREE, USING LEFT INTERNAL MAMMARTY ARTERY AND ENDOSCOPICALLY HARVESTED RIGHT GREATER SAPHENOUS VEIN;  Surgeon: Gaye Pollack, MD;  Location: [place];  Service: Open Heart Surgery;  Laterality: N/A;   ENDOVEIN HARVEST OF GREATER SAPHENOUS VEIN Right 08/05/2021   Procedure: ENDOVEIN HARVEST OF GREATER SAPHENOUS VEIN;  Surgeon: Gaye Pollack, MD;  Location: [place];  Service: Open Heart Surgery;  Laterality: Right;   IR URETERAL STENT RIGHT NEW ACCESS W/O SEP NEPHROSTOMY CATH  07/15/2021   KNEE ARTHROSCOPY WITH MEDIAL MENISECTOMY Left 10/29/2019   Procedure: KNEE ARTHROSCOPY WITH MEDIAL AND  LATERAL MENISECTOMY WITH DEBRIDEMENT;  Surgeon: Susa Day, MD;  Location: [facility];  Service: Orthopedics;  Laterality: Left;  60 mins   LEFT HEART CATH AND CORONARY ANGIOGRAPHY N/A 08/02/2021   Procedure: LEFT HEART CATH AND CORONARY ANGIOGRAPHY;  Surgeon: Jettie Booze, MD;  Location: [place] [facility];  Service: Cardiovascular;  Laterality: N/A;   NASAL SINUS SURGERY     NEPHROLITHOTOMY Right 05/31/2015   Procedure: RIGHT PERCUTANEOUS NEPHROLITHOTOMY  ;  Surgeon: Kathie Rhodes, MD;  Location: [facility];  Service: Urology;  Laterality: Right;   NEPHROLITHOTOMY Right 07/15/2021   Procedure: NEPHROLITHOTOMY PERCUTANEOUS;  Surgeon: Janith Lima, MD;  Location: [facility];  Service: Urology;  Laterality: Right;   prolapsed bladder and rectocele surgery      TEE WITHOUT CARDIOVERSION N/A 08/05/2021   Procedure: TRANSESOPHAGEAL ECHOCARDIOGRAM (TEE);  Surgeon: Gaye Pollack, MD;  Location: [place];  Service: Open Heart Surgery;  Laterality: N/A;   TUBAL LIGATION     1983   URETERAL STENT PLACEMENT     times 2 secondary to kidney stone     Allergies  Allergen Reactions   Augmentin [Amoxicillin-Pot Clavulanate] Nausea And Vomiting   Ciprofloxacin Hives   Sulfa Antibiotics Other (See Comments)    Trembling, paralyzing pain,  red eyes, skin crawling   Latex Itching   Macrolides And Ketolides Diarrhea and Nausea And Vomiting   Tetracyclines & Related Other (See Comments) and Tinitus    Vertigo    Objective: Physical Exam General: The patient is alert and oriented x3 in no acute distress.  Dermatology: Hyperkeratotic, discolored, thickened, onychodystrophy noted left great toe. Skin is warm, dry and supple bilateral lower extremities. Negative for open lesions or macerations.  Vascular: Palpable pedal pulses bilaterally. No edema or erythema noted. Capillary refill within normal limits.  Neurological: Epicritic and protective threshold grossly intact bilaterally.   Musculoskeletal  Exam: No pedal deformity noted  Assessment: #1 Onychomycosis of toenails  Plan of Care:  #1 Patient was evaluated. #2  Today we discussed different treatment options including oral, topical, and laser antifungal treatment modalities.  We discussed their efficacies and side effects.  Patient opts for oral antifungal treatment modality #3 Tolcylen antifungal topical dispensed at checkout to apply daily #4 return to clinic as needed   Felecia Shelling, DPM [facility]  Dr. Felecia Shelling, DPM    [address] [place], [place] [address]                Office [phone]  Fax [phone]

## 2022-05-02 ENCOUNTER — Telehealth: Payer: Self-pay | Admitting: *Deleted

## 2022-05-02 NOTE — Telephone Encounter (Signed)
Patient is calling for status of a topical cream mentioned during office visit.  She will come in to pick up from front desk today.

## 2022-08-16 ENCOUNTER — Telehealth: Payer: Self-pay | Admitting: Internal Medicine

## 2022-08-16 NOTE — Telephone Encounter (Signed)
Attempted to call the patient, received a busy signal .

## 2022-08-16 NOTE — Telephone Encounter (Signed)
Pt c/o medication issue:  1. Name of Medication:   2. How are you currently taking this medication (dosage and times per day)?   3. Are you having a reaction (difficulty breathing--STAT)?   4. What is your medication issue?   Patient would like to start on CX8 and a nerve shield support medication for her neuropathy and she would like to know if D.r Hilty agrees. Please advise.

## 2022-08-17 NOTE — Telephone Encounter (Signed)
Spoke to pt she states she has been reading and she wants to know can she take Nerve shield Pro and cardiovascular supplement xtend life (CX8), both are over the counter. She wants to know does Dr. Debara Pickett approve of these supplements. Pt's phone is form the [place] "some offices can get thru and some can not." Pt states she will call back Monday.

## 2022-08-18 NOTE — Telephone Encounter (Signed)
Attempted to call patient. Call did not go thru. Sent MyChart message

## 2022-08-18 NOTE — Telephone Encounter (Signed)
Do not recommend as they are not FDA approved. CX8 also contains 174mg of Vitamin K which may contribute to blood clotting.

## 2022-12-22 ENCOUNTER — Other Ambulatory Visit: Payer: Self-pay | Admitting: Endocrinology

## 2022-12-22 DIAGNOSIS — Z1231 Encounter for screening mammogram for malignant neoplasm of breast: Secondary | ICD-10-CM

## 2022-12-26 ENCOUNTER — Ambulatory Visit
Admission: RE | Admit: 2022-12-26 | Discharge: 2022-12-26 | Disposition: A | Discharge status: Home / Self Care | Payer: Medicare Other | Source: Ambulatory Visit | Attending: Endocrinology | Admitting: Endocrinology

## 2022-12-26 DIAGNOSIS — Z1231 Encounter for screening mammogram for malignant neoplasm of breast: Secondary | ICD-10-CM

## 2023-04-30 IMAGING — DX DG CHEST 1V PORT
1 series · 1 of 1 positions shown · non-contrast
Comparison: 07/15/2021

CLINICAL DATA: Chest pain

EXAM:
PORTABLE CHEST 1 VIEW

[chest]
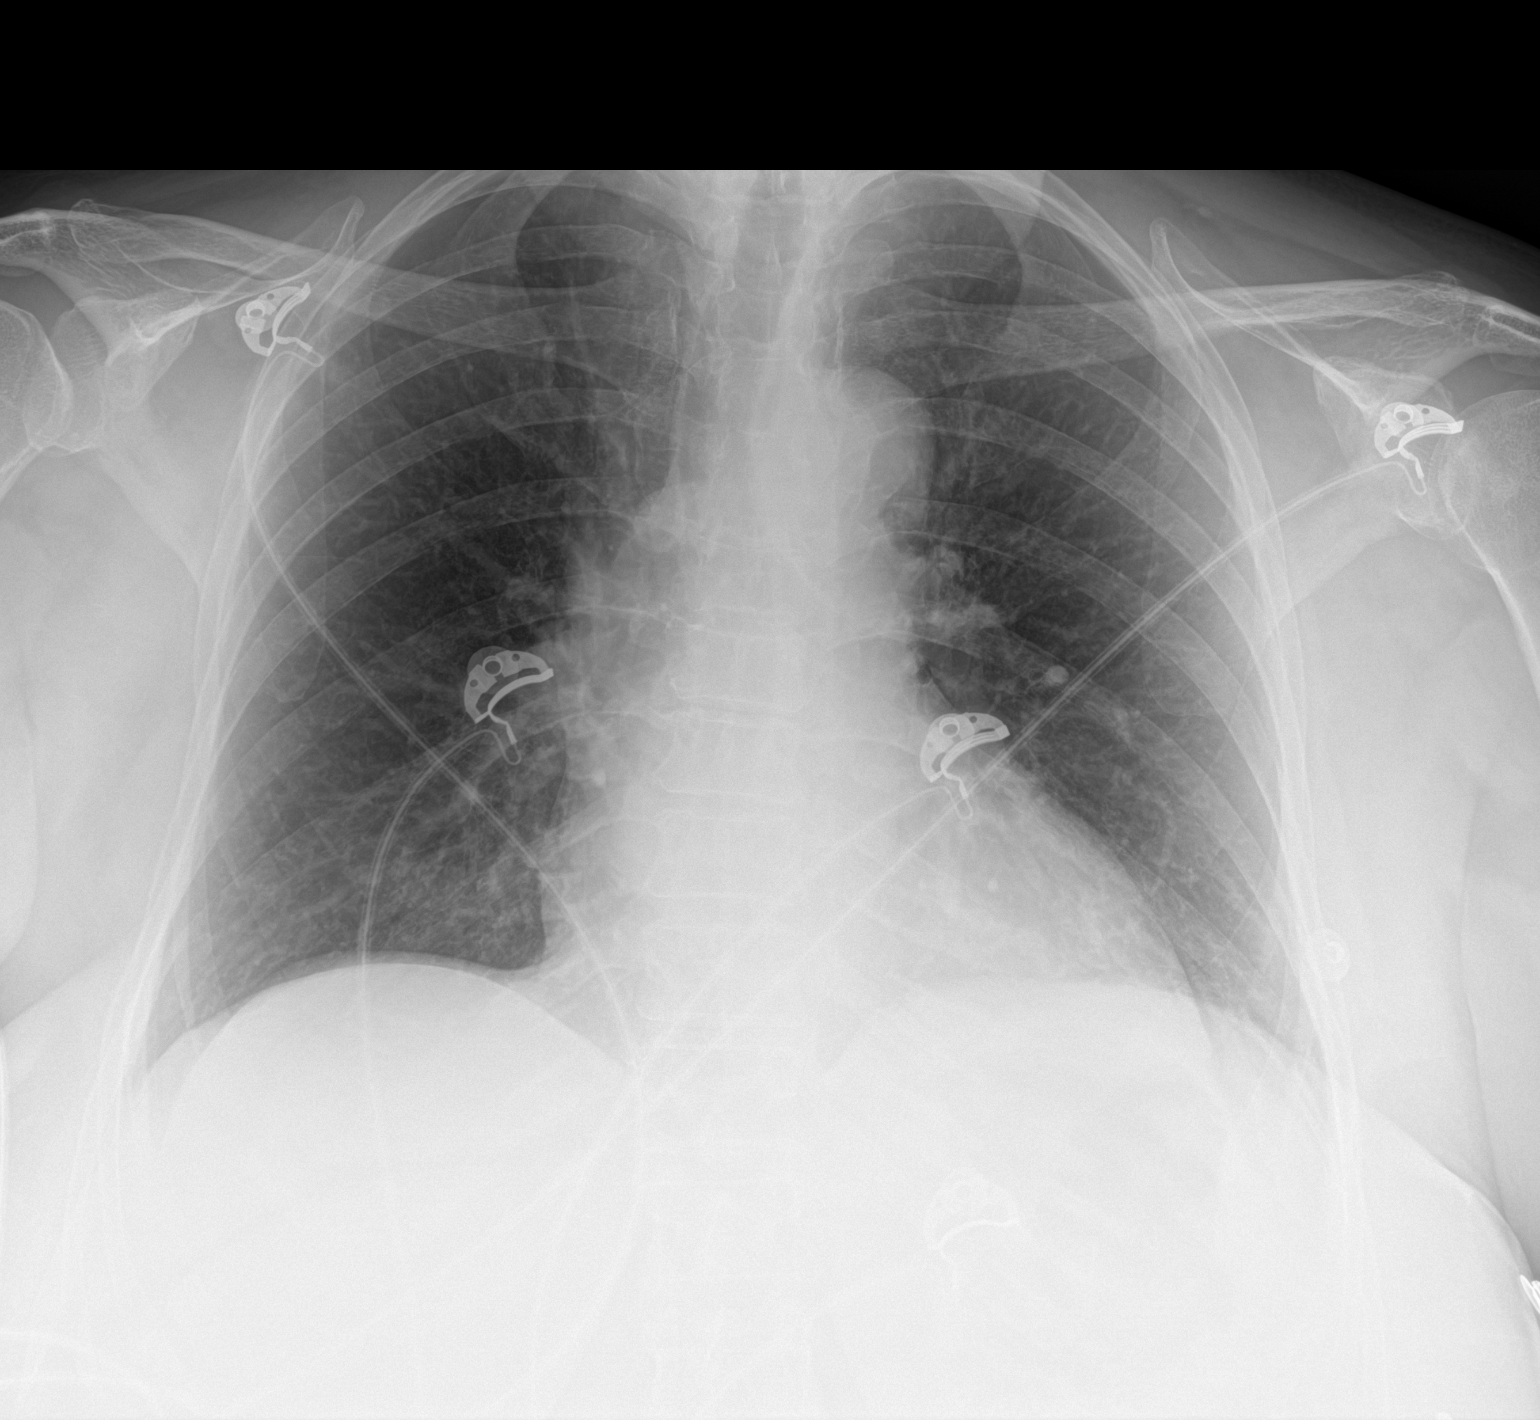

[1 of 1 positions shown; findings below may reference images not displayed]

FINDINGS: The heart size and mediastinal contours are within normal limits.
Both lungs are clear. The visualized skeletal structures are
unremarkable.
IMPRESSION: No acute abnormality of the lungs in AP portable projection.

## 2023-05-15 ENCOUNTER — Ambulatory Visit: Payer: Medicare Other | Admitting: Physician Assistant

## 2023-05-15 NOTE — Progress Notes (Deleted)
Office Visit    Patient Name: Tiffany Odonnell Date of Encounter: 05/15/2023  Primary Care Provider:  Felix Pacini, FNP Primary Cardiologist:  Chrystie Nose, MD  Chief Complaint    72 y.o. female   Past Medical History  Subjective   Past Medical History:  Diagnosis Date   Anxiety    Blood in urine    Cervical disc disorder    bulging disc   Coronary artery disease    Diabetes mellitus without complication (HCC)    Dizziness    Falls    Fibromyalgia    H/O Salmonella gastroenteritis    High cholesterol    History of frequent urinary tract infections    History of hiatal hernia    History of vertigo    Hypertension    Imbalance    Kidney stones    Lichen planopilaris    Lumbar herniated disc    L5-L6   Numbness and tingling    hands and feet bilat    Oscillopsia    Peripheral neuropathy    Septicemia (HCC)    2013   Sinusitis    Sjogren's disease (HCC)    Tinnitus    Wears glasses    Past Surgical History:  Procedure Laterality Date   ABDOMINAL HYSTERECTOMY     2014   CARDIAC CATHETERIZATION     CORONARY ARTERY BYPASS GRAFT N/A 08/05/2021   Procedure: CORONARY ARTERY BYPASS GRAFTING (CABG) TIMES THREE, USING LEFT INTERNAL MAMMARTY ARTERY AND ENDOSCOPICALLY HARVESTED RIGHT GREATER SAPHENOUS VEIN;  Surgeon: Alleen Borne, MD;  Location: [facility];  Service: Open Heart Surgery;  Laterality: N/A;   ENDOVEIN HARVEST OF GREATER SAPHENOUS VEIN Right 08/05/2021   Procedure: ENDOVEIN HARVEST OF GREATER SAPHENOUS VEIN;  Surgeon: Alleen Borne, MD;  Location: [facility];  Service: Open Heart Surgery;  Laterality: Right;   IR URETERAL STENT RIGHT NEW ACCESS W/O SEP NEPHROSTOMY CATH  07/15/2021   KNEE ARTHROSCOPY WITH MEDIAL MENISECTOMY Left 10/29/2019   Procedure: KNEE ARTHROSCOPY WITH MEDIAL AND LATERAL MENISECTOMY WITH DEBRIDEMENT;  Surgeon: Jene Every, MD;  [facility];  Service: Orthopedics;  Laterality: Left;  60 mins   LEFT HEART CATH AND  CORONARY ANGIOGRAPHY N/A 08/02/2021   Procedure: LEFT HEART CATH AND CORONARY ANGIOGRAPHY;  Surgeon: Corky Crafts, MD;  Location: [facility];  Service: Cardiovascular;  Laterality: N/A;   NASAL SINUS SURGERY     NEPHROLITHOTOMY Right 05/31/2015   Procedure: RIGHT PERCUTANEOUS NEPHROLITHOTOMY  ;  Surgeon: Ihor Gully, MD;  [facility];  Service: Urology;  Laterality: Right;   NEPHROLITHOTOMY Right 07/15/2021   Procedure: NEPHROLITHOTOMY PERCUTANEOUS;  Surgeon: Jannifer Hick, MD;  [facility];  Service: Urology;  Laterality: Right;   prolapsed bladder and rectocele surgery      TEE WITHOUT CARDIOVERSION N/A 08/05/2021   Procedure: TRANSESOPHAGEAL ECHOCARDIOGRAM (TEE);  Surgeon: Alleen Borne, MD;  Location: [facility];  Service: Open Heart Surgery;  Laterality: N/A;   TUBAL LIGATION     1983   URETERAL STENT PLACEMENT     times 2 secondary to kidney stone     Allergies  Allergies  Allergen Reactions   Augmentin [Amoxicillin-Pot Clavulanate] Nausea And Vomiting   Ciprofloxacin Hives   Sulfa Antibiotics Other (See Comments)    Trembling, paralyzing pain, red eyes, skin crawling   Latex Itching   Macrolides And Ketolides Diarrhea and Nausea And Vomiting   Tetracyclines & Related Other (See Comments) and Tinitus  Vertigo      History of Present Illness      72 y.o. y/o female with past medical history of CAD s/p CABG, hypertension, hyperlipidemia, fibromyalgia, T2DM, sjogrens, postoperative atrial fibrillation.   08/01/2021 presenting due to chest pain.  Sensitive troponins 73, 433.  Echo 07/22/2021 LVEF 60 to 65%, grade 1 DD.  LHC 08/03/2021 with proximal LAD 75% stenosed, proximal mid LAD 100% stenosed right to left collaterals, first diagonal 95% stenosed.  On 08/05/2021 he underwent CABG x 3.  Of note she developed postoperative atrial fibrillation converted with amiodarone.  09/19/2021 seen by cardiothoracic, Plavix discontinued and aspirin continued, amiodarone  decreased to 200 mg daily. She was having bradycardia with heart rate in the 30s but generally in the mid 50s so she was taken off amiodarone and her atenolol was decreased from 50 to 25 mg daily.  Last seen on 02/13/2022 by Dr. Rennis Golden, heart rate not much improved after decreasing her atenolol.  She was not symptomatic so her medication regimen was continued. {There is no content from the last Narrative History section.}     Objective  Home Medications    Current Outpatient Medications  Medication Sig Dispense Refill   acetaminophen (TYLENOL) 500 MG tablet Take 1,000 mg by mouth every 6 (six) hours as needed for headache (pain).     Ascorbic Acid (VITAMIN C) 1000 MG tablet Take 2,000 mg by mouth daily as needed (immune support).     aspirin EC 81 MG tablet Take 1 tablet (81 mg total) by mouth daily. 14 tablet 1   atenolol (TENORMIN) 25 MG tablet Take 1 tablet (25 mg total) by mouth daily. 90 tablet 3   betamethasone dipropionate 0.05 % lotion Apply 1 application topically 2 (two) times daily as needed (scalp rash).  2   Carboxymethylcellulose Sodium (THERATEARS OP) Place 1 drop into both eyes 2 (two) times daily as needed (dry eyes).     hydrocortisone (ANUSOL-HC) 2.5 % rectal cream Place 1 application rectally 3 (three) times daily as needed for hemorrhoids.     indapamide (LOZOL) 2.5 MG tablet Take 2.5 mg by mouth every morning.     ketorolac (ACULAR) 0.5 % ophthalmic solution Instill 1 drop TID in operative eye(s) starting 2 days prior to surgery and 3 weeks following surgery.     MAGNESIUM PO Take 1 tablet by mouth at bedtime as needed (relax nervew). (Patient not taking: Reported on 02/13/2022)     Multiple Vitamins-Minerals (ZINC PO) Take 1 tablet by mouth at bedtime as needed (to relax nerves).     OVER THE COUNTER MEDICATION LIV PURE     Potassium Citrate 15 MEQ (1620 MG) TBCR Take 15 mEq by mouth in the morning and at bedtime.     PREBIOTIC PRODUCT PO Take by mouth.     prednisoLONE  acetate (PRED FORTE) 1 % ophthalmic suspension Instill 1 drop TID in operative eye(s) starting 2 days prior to surgery and 3 weeks following surgery     rosuvastatin (CRESTOR) 20 MG tablet TAKE 1 TABLET EVERY DAY 30 tablet 10   tobramycin (TOBREX) 0.3 % ophthalmic solution Instill 1 drop TID in operative eye(s) starting 2 days prior to surgery and 3 weeks following surgery.     No current facility-administered medications for this visit.     Physical Exam    VS:  There were no vitals taken for this visit. , BMI There is no height or weight on file to calculate BMI.  GEN: Well nourished, well developed, in no acute distress. HEENT: normal. Neck: Supple, no JVD, carotid bruits, or masses. Cardiac: RRR, no murmurs, rubs, or gallops. No clubbing, cyanosis, edema.  Radials 2+/PT 2+ and equal bilaterally.  Respiratory:  Respirations regular and unlabored, clear to auscultation bilaterally. GI: Soft, nontender, nondistended, BS + x 4. MS: no deformity or atrophy. Skin: warm and dry, no rash. Neuro:  Strength and sensation are intact. Psych: Normal affect.  Accessory Clinical Findings    ECG personally reviewed by me today -    *** - no acute changes.  Lab Results  Component Value Date   WBC 5.7 09/19/2021   HGB 13.6 09/19/2021   HCT 41.4 09/19/2021   MCV 97 09/19/2021   PLT 245 09/19/2021   Lab Results  Component Value Date   CREATININE 1.05 (H) 02/13/2022   BUN 28 (H) 02/13/2022   NA 137 02/13/2022   K 5.2 02/13/2022   CL 98 02/13/2022   CO2 27 02/13/2022   Lab Results  Component Value Date   ALT 22 02/13/2022   AST 19 02/13/2022   ALKPHOS 77 02/13/2022   BILITOT 0.5 02/13/2022   Lab Results  Component Value Date   CHOL 157 02/13/2022   HDL 70 02/13/2022   LDLCALC 69 02/13/2022   TRIG 98 02/13/2022   CHOLHDL 2.2 02/13/2022    Lab Results  Component Value Date   HGBA1C 5.9 (H) 08/01/2021   Lab Results  Component Value Date   TSH 0.871 08/01/2021        Assessment & Plan       {Are you ordering a CV Procedure (e.g. stress test, cath, DCCV, TEE, etc)?   Press F2        :[id number]}  Dispo: Denyce Robert, DNP, AGNP-C 05/15/2023, 8:11 AM

## 2023-05-30 IMAGING — DX DG CHEST 2V
2 series · 2 of 2 positions shown · non-contrast
Comparison: 08/09/2021

CLINICAL DATA: Status post CABG

EXAM:
CHEST - 2 VIEW

[dg chest 2 view (1 of 2)]
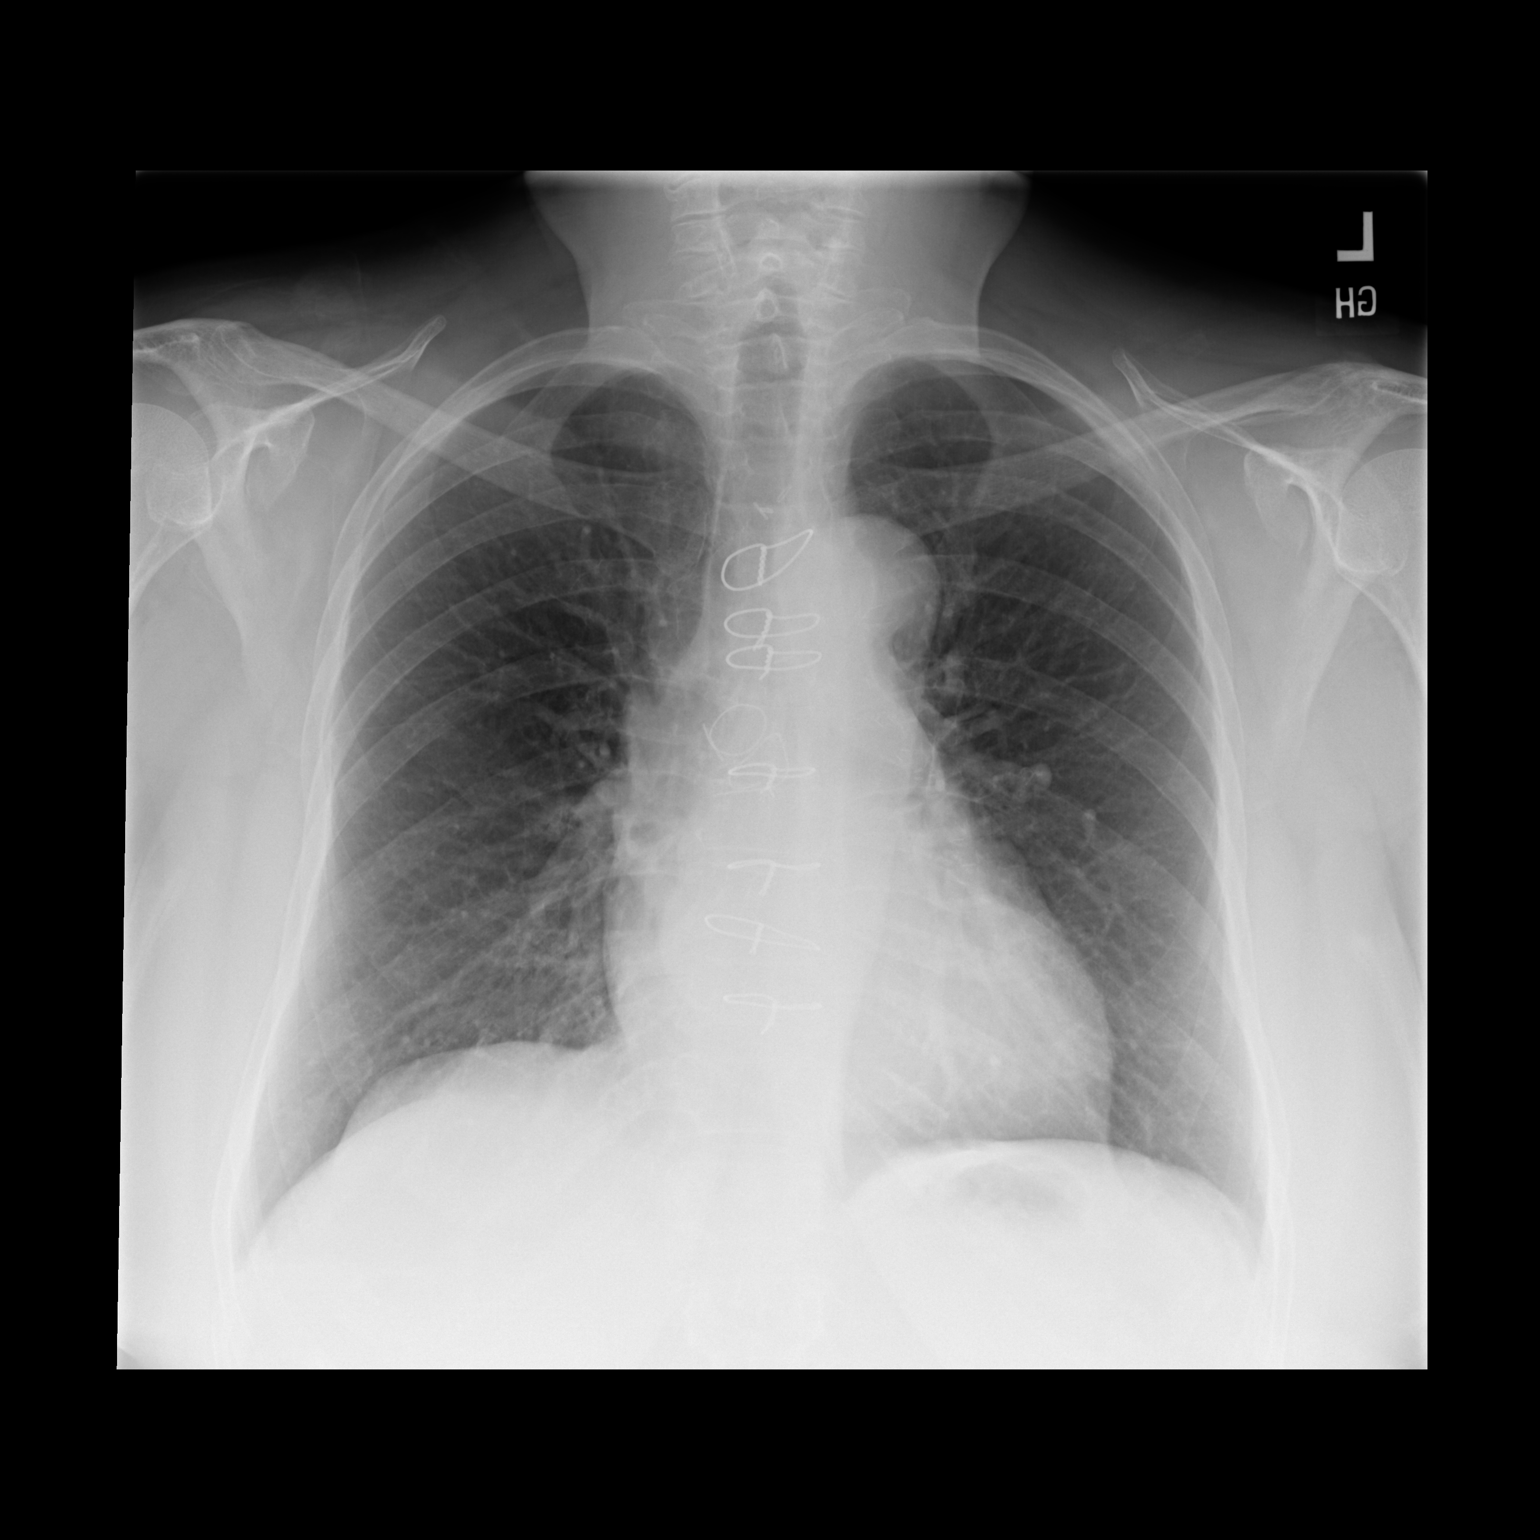

[dg chest 2 view (2 of 2)]
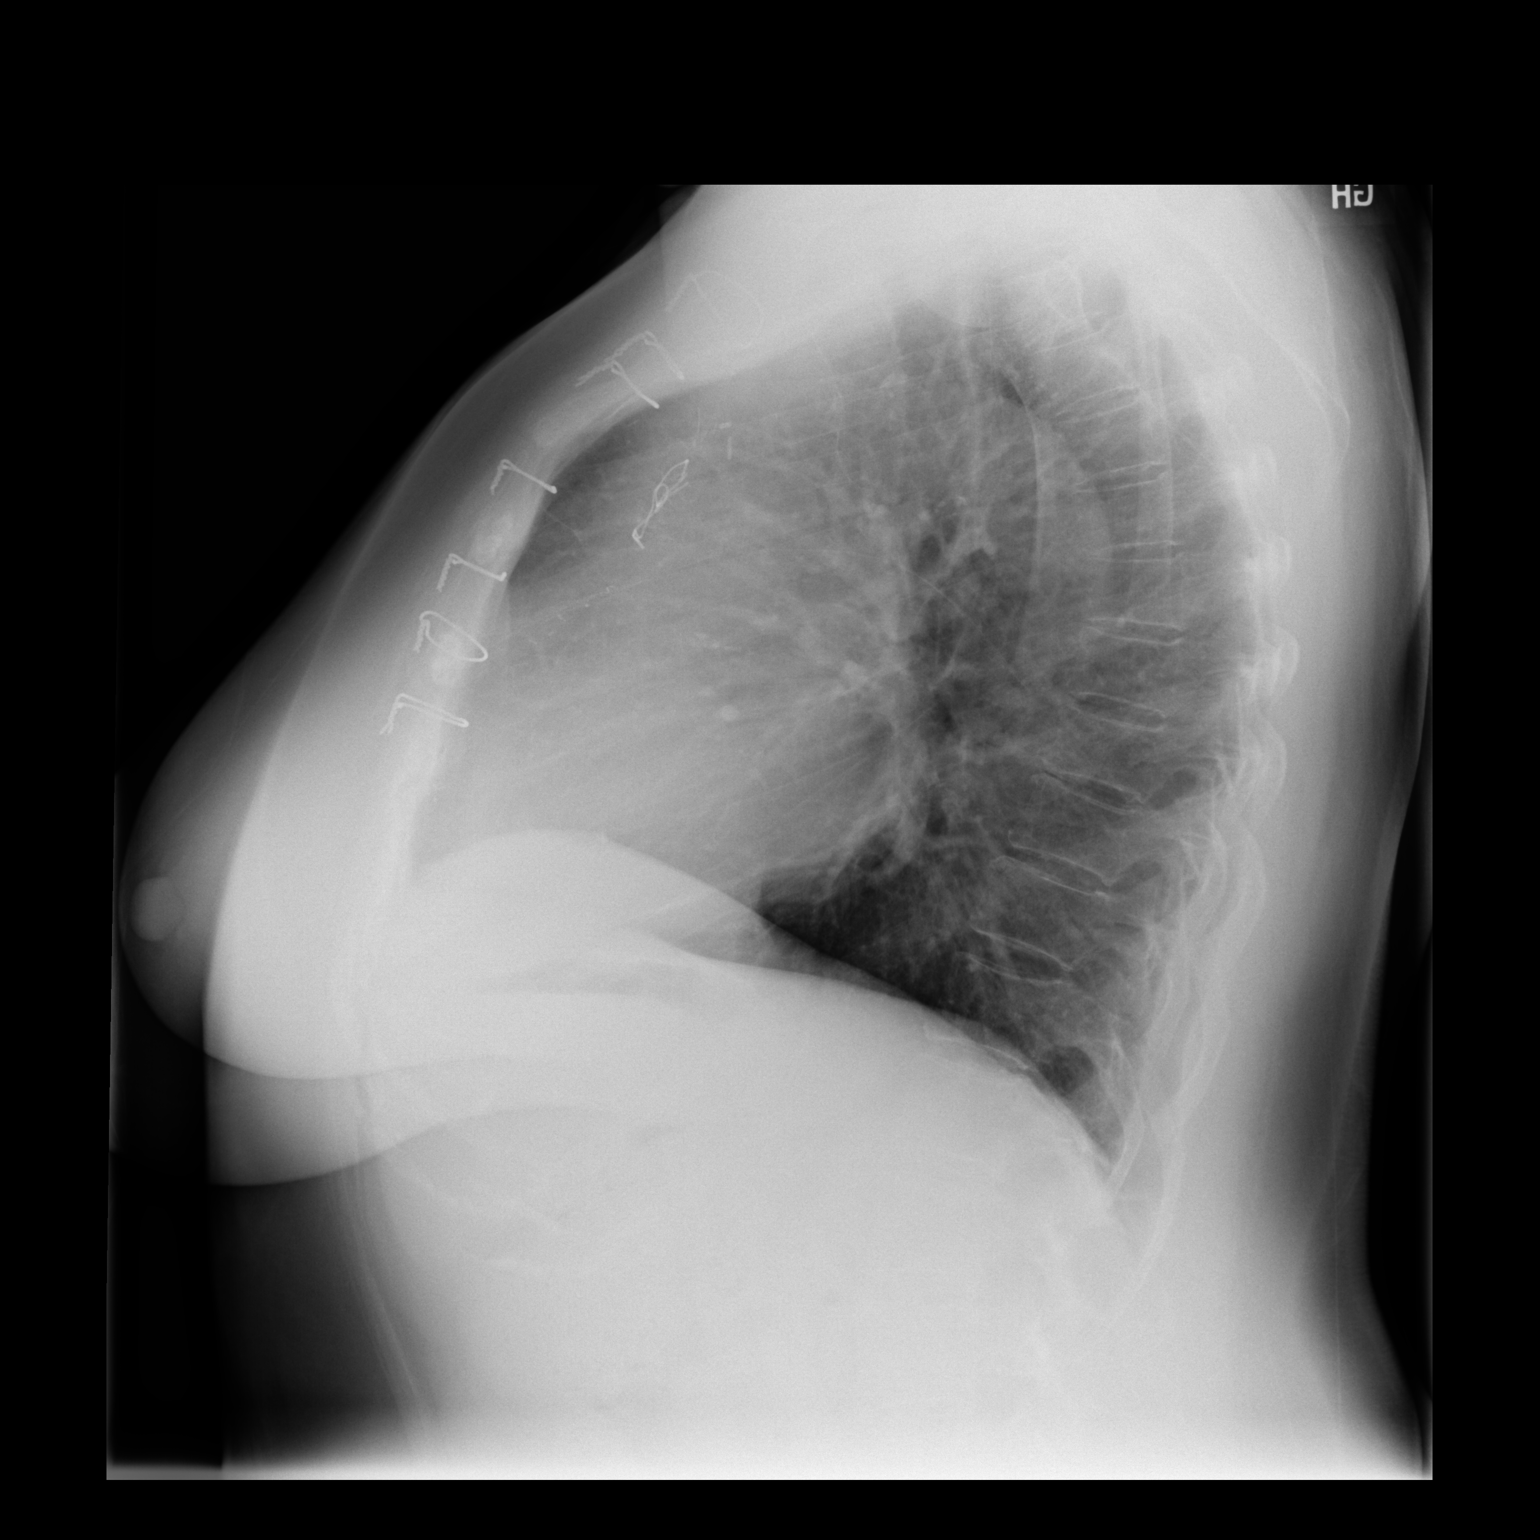

[2 of 2 positions shown; findings below may reference images not displayed]

FINDINGS: 3

No focal consolidation. No pleural effusion or pneumothorax. Heart
and mediastinal contours are unremarkable. Prior CABG.

No acute osseous abnormality.
IMPRESSION: No active cardiopulmonary disease.

## 2023-07-03 ENCOUNTER — Ambulatory Visit: Payer: Medicare Other | Admitting: Physician Assistant

## 2023-07-18 NOTE — Progress Notes (Signed)
Cardiology Office Note    Date:  07/20/2023  ID:  Tiffany Odonnell, DOB [birth year], MRN [id number] PCP:  Tiffany Pacini, FNP  Cardiologist:  Tiffany Nose, MD  Electrophysiologist:  None   Chief Complaint: Follow up for CAD   History of Present Illness: .    Tiffany Odonnell is a 73 y.o. female with visit-pertinent history of CAD s/p CABG, hypertension, hyperlipidemia, fibromyalgia, type 2 diabetes mellitus, sjorgens.   On 08/01/2021 she presented to the ED with chest pain high sensitive troponin 73-433.  Echo on 08/02/2021 with LVEF 60 to 65%, grade 1 DD.  Cardiac catheterization on 08/03/2021 with pLAD 75% stenosed, proximal mid LAD 100% stenosis, right to left collaterals, first diagonal 95% stenosed.  On 08/05/2021 she underwent CABG x 3.  She developed postoperative atrial fibrillation and was converted with amiodarone.  She did well initial months postop however she contact the office on 10/24/2021 noting heart rate of 49 with feeling faint and lightheaded BP at that time was 130/78.  A few minutes following her blood pressure was 143/77 and heart rate 60 bpm.  Her amiodarone was discontinued and her atenolol was decreased to 25 mg daily.  Cardiac monitor showed an average heart rate of 55 bpm, ranging from 38 to 95 bpm.  They were few PACs and PVCs, she had 1 triggered event for dizziness which showed sinus bradycardia in the 50s.  She was last seen in clinic on 02/13/2022 by Dr. Rennis Odonnell.  She remained stable from a cardiac standpoint.  Her blood pressure was mildly elevated in office, patient reported some history of whitecoat hypertension.  Today she presents for follow up. She reports that she is doing well overall.  She reports that she has reduced her carb intake and has had increased energy and a decrease in her neuropathy.  She denies chest pain, shortness of breath, palpitations, orthopnea or PND.  She reports that she monitors her blood pressure at home and is well controlled,  this morning her blood pressure was 110/83.  She reports that she has been walking and exercising regularly, tolerating well.  Patient reports that she has been having significant trouble with her veneers, she is planning to follow-up with her dentist regarding this.  ROS: .   Today she denies chest pain, shortness of breath, lower extremity edema, fatigue, palpitations, melena, hematuria, hemoptysis, diaphoresis, weakness, presyncope, syncope, orthopnea, and PND.  All other systems are reviewed and otherwise negative. Studies Reviewed: Tiffany Odonnell Kitchen    EKG:  EKG is ordered today, personally reviewed, demonstrating  EKG Interpretation Date/Time:  Friday July 20 2023 12:04:22 EST Ventricular Rate:  68 PR Interval:  188 QRS Duration:  86 QT Interval:  398 QTC Calculation: 423 R Axis:   72  Text Interpretation: Normal sinus rhythm Normal ECG When compared with ECG of 06-Aug-2021 07:18, Premature atrial complexes are no longer Present Right bundle branch block is no longer Present Confirmed by Tiffany Odonnell [phone]) on 07/20/2023 12:11:20 PM   CV Studies:  Cardiac Studies & Procedures   CARDIAC CATHETERIZATION  CARDIAC CATHETERIZATION 08/02/2021  Narrative   Prox LAD lesion is 75% stenosed.   Prox LAD to Mid LAD lesion is 100% stenosed.  Right to left collaterals fill the mid to distal LAD.   1st Diag lesion is 95% stenosed.   Prox RCA lesion is 40% stenosed.   Mid RCA lesion is 25% stenosed.   The left ventricular systolic function is normal.   LV end diastolic  pressure is normal.   The left ventricular ejection fraction is 55-65% by visual estimate.   There is no aortic valve stenosis.  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  Plan for cardiac surgery consult.  Findings Coronary Findings Diagnostic  Dominance: Right  Left Anterior Descending Collaterals Dist LAD filled by collaterals from RPDA.  Prox LAD lesion is 75% stenosed.  The lesion is severely calcified. Prox LAD to Mid LAD lesion is 100% stenosed.  First Diagonal Branch 1st Diag lesion is 95% stenosed.  Left Circumflex The vessel exhibits minimal luminal irregularities.  Right Coronary Artery Vessel is large. Prox RCA lesion is 40% stenosed. Mid RCA lesion is 25% stenosed.  Intervention  No interventions have been documented.    ECHOCARDIOGRAM  ECHOCARDIOGRAM COMPLETE 08/02/2021  Narrative ECHOCARDIOGRAM REPORT    Patient Name:   Tiffany Odonnell Date of Exam: 08/02/2021 Medical Rec #:  [id number]     Height:       61.0 in Accession #:    [id number]    Weight:       155.0 lb Date of Birth:  06/03/1951     BSA:          1.695 m Patient Age:    70 years      BP:           112/68 mmHg Patient Gender: F             HR:           58 bpm. Exam Location:  Inpatient  Procedure: 2D Echo, Color Doppler and Cardiac Doppler  Indications:    NSTEMI I21.4  History:        Patient has no prior history of Echocardiogram examinations. Risk Factors:Hypertension and Diabetes.  Sonographer:    Tiffany Odonnell RDCS Referring Phys: [id number] Tiffany Odonnell  IMPRESSIONS   1. Left ventricular ejection fraction, by estimation, is 60 to 65%. The left ventricle has normal function. The left ventricle has no regional wall motion abnormalities. Left ventricular diastolic parameters are consistent with Grade I diastolic dysfunction (impaired relaxation). 2. Right ventricular systolic function is normal. The right ventricular size is normal. There is normal pulmonary artery systolic pressure. 3. The mitral valve is normal in structure. Mild mitral valve regurgitation. No evidence of mitral stenosis. 4. The aortic valve is tricuspid. There is mild calcification of the aortic valve. Aortic valve regurgitation is not visualized. Aortic valve sclerosis is present, with no evidence of aortic valve stenosis. 5. The inferior vena cava is normal in size with greater than 50%  respiratory variability, suggesting right atrial pressure of 3 mmHg.  FINDINGS Left Ventricle: Left ventricular ejection fraction, by estimation, is 60 to 65%. The left ventricle has normal function. The left ventricle has no regional wall motion abnormalities. The left ventricular internal cavity size was normal in size. There is no left ventricular hypertrophy. Left ventricular diastolic parameters are consistent with Grade I diastolic dysfunction (impaired relaxation).  Right Ventricle: The right ventricular size is normal. No increase in right ventricular wall thickness. Right ventricular systolic function is normal. There is normal pulmonary artery systolic pressure. The tricuspid regurgitant velocity is 1.78 m/s, and with an assumed right atrial pressure of 3 mmHg, the estimated right ventricular systolic pressure is 15.7 mmHg.  Left Atrium: Left atrial size was normal in size.  Right Atrium: Right atrial size was normal in size.  Pericardium: There is no evidence of pericardial effusion.  Mitral Valve: The mitral valve is normal in structure. Mild mitral valve regurgitation. No evidence of mitral valve stenosis.  Tricuspid Valve: The tricuspid valve is normal in structure. Tricuspid valve regurgitation is mild . No evidence of tricuspid stenosis.  Aortic Valve: The aortic valve is tricuspid. There is mild calcification of the aortic valve. Aortic valve regurgitation is not visualized. Aortic valve sclerosis is present, with no evidence of aortic valve stenosis.  Pulmonic Valve: The pulmonic valve was normal in structure. Pulmonic valve regurgitation is not visualized. No evidence of pulmonic stenosis.  Aorta: The aortic root is normal in size and structure.  Venous: The inferior vena cava is normal in size with greater than 50% respiratory variability, suggesting right atrial pressure of 3 mmHg.  IAS/Shunts: No atrial level shunt detected by color flow Doppler.   LEFT  VENTRICLE PLAX 2D LVIDd:         4.60 cm     Diastology LVIDs:         3.10 cm     LV e' medial:    5.59 cm/s LV PW:         0.90 cm     LV E/e' medial:  12.6 LV IVS:        1.00 cm     LV e' lateral:   7.28 cm/s LVOT diam:     1.80 cm     LV E/e' lateral: 9.7 LV SV:         53 LV SV Index:   31 LVOT Area:     2.54 cm  LV Volumes (MOD) LV vol d, MOD A2C: 81.1 ml LV vol d, MOD A4C: 74.2 ml LV vol s, MOD A2C: 30.5 ml LV vol s, MOD A4C: 28.2 ml LV SV MOD A2C:     50.6 ml LV SV MOD A4C:     74.2 ml LV SV MOD BP:      48.8 ml  RIGHT VENTRICLE RV S prime:     9.07 cm/s TAPSE (M-mode): 2.1 cm  LEFT ATRIUM           Index        RIGHT ATRIUM           Index LA diam:      3.40 cm 2.01 cm/m   RA Area:     14.70 cm LA Vol (A2C): 31.1 ml 18.35 ml/m  RA Volume:   38.00 ml  22.42 ml/m LA Vol (A4C): 38.1 ml 22.48 ml/m AORTIC VALVE LVOT Vmax:   87.80 cm/s LVOT Vmean:  59.100 cm/s LVOT VTI:    0.209 m  AORTA Ao Root diam: 3.10 cm Ao Asc diam:  3.40 cm  MITRAL VALVE               TRICUSPID VALVE MV Area (PHT): 3.68 cm    TR Peak grad:   12.7 mmHg MV Decel Time: 206 msec    TR Vmax:        178.00 cm/s MV E velocity: 70.70 cm/s MV A velocity: 63.00 cm/s  SHUNTS MV E/A ratio:  1.12        Systemic VTI:  0.21 m Systemic Diam: 1.80 cm  Donato Schultz MD Electronically signed by Donato Schultz MD Signature Date/Time: 08/02/2021/11:51:16 AM    Final  TEE  ECHO INTRAOPERATIVE TEE 08/05/2021  Narrative *INTRAOPERATIVE TRANSESOPHAGEAL REPORT *    Patient Name:   LUZIA HACKER Date of Exam: 08/05/2021 Medical Rec #:  [id number]  Height:       61.0 in Accession #:    [id number]    Weight:       151.8 lb Date of Birth:  10-13-1950     BSA:          1.68 m Patient Age:    70 years      BP:           113/73 mmHg Patient Gender: F             HR:           79 bpm. Exam Location:  Inpatient  Transesophogeal exam was perform intraoperatively during surgical procedure. Patient was  closely monitored under general anesthesia during the entirety of examination.  Indications:     CABG Sonographer:     Ross Ludwig RDCS (AE) Performing Phys: [id number] Alleen Borne Diagnosing Phys: Arrie Aran MD  Complications: No known complications during this procedure. POST-OP IMPRESSIONS _ Left Ventricle: The left ventricle is unchanged from pre-bypass. _ Right Ventricle: The right ventricle appears unchanged from pre-bypass. _ Aorta: The aorta appears unchanged from pre-bypass. _ Left Atrial Appendage: The left atrial appendage appears unchanged from pre-bypass. _ Aortic Valve: The aortic valve appears unchanged from pre-bypass. _ Mitral Valve: There is mild regurgitation. _ Tricuspid Valve: There is mild regurgitation. _ Pulmonic Valve: The pulmonic valve appears unchanged from pre-bypass. _ Interatrial Septum: The interatrial septum appears unchanged from pre-bypass. _ Comments: Post-bypass images reviewed with surgeon.  PRE-OP FINDINGS Left Ventricle: The left ventricle has normal systolic function, with an ejection fraction of 60-65%. The cavity size was normal.   Right Ventricle: The right ventricle has normal systolic function. The cavity was normal. There is no increase in right ventricular wall thickness.  Left Atrium: Left atrial size was normal in size. No left atrial/left atrial appendage thrombus was detected.  Right Atrium: Right atrial size was normal in size.  Interatrial Septum: No atrial level shunt detected by color flow Doppler.  Pericardium: Trivial pericardial effusion is present.  Mitral Valve: The mitral valve is normal in structure. Mitral valve regurgitation is trivial by color flow Doppler.  Tricuspid Valve: The tricuspid valve was normal in structure. Tricuspid valve regurgitation is trivial by color flow Doppler.  Aortic Valve: The aortic valve is tricuspid Aortic valve regurgitation was not visualized by color flow Doppler. There is mild  calcification present.  Pulmonic Valve: The pulmonic valve was normal in structure. Pulmonic valve regurgitation is not visualized by color flow Doppler.   Aorta: The aortic root, ascending aorta and aortic arch are normal in size and structure.  Pulmonary Artery: The pulmonary artery is of normal size.   Arrie Aran MD Electronically signed by Arrie Aran MD Signature Date/Time: 08/05/2021/2:34:48 PM    Final  MONITORS  LONG TERM MONITOR-LIVE TELEMETRY (3-14 DAYS) 11/14/2021  Narrative Patch Wear Time:  13 days and 1 hours (2023-04-27T09:30:42-0400 to 2023-05-10T11:06:34-0400)  Patient had a min HR of 38 bpm, max HR of 95 bpm, and avg HR of 55 bpm. Predominant underlying rhythm was Sinus Rhythm. Isolated SVEs were rare (<1.0%), and no SVE Couplets or SVE Triplets were present. Isolated VEs were rare (<1.0%), and no VE Couplets or VE Triplets were present.  Essentially normal monitor. Few PAC's and PVCs. HR in the 30's at night - one patient triggered event for dizziness, showed sinus bradycardia in the 50s.  Tiffany Nose, MD, [facility], FACP [place]  [facility] Medical Director of the [facility]  Disorders [facility]ovascular [facility] Diplomate of the American Board of Clinical Lipidology Attending Cardiologist Direct Dial: [phone]  Fax: [phone] Website:  [email or web address]           Current Reported Medications:.    Current Meds  Medication Sig   aspirin EC 81 MG tablet Take 1 tablet (81 mg total) by mouth daily.   betamethasone dipropionate 0.05 % lotion Apply 1 application topically 2 (two) times daily as needed (scalp rash).   Carboxymethylcellulose Sodium (THERATEARS OP) Place 1 drop into both eyes 2 (two) times daily as needed (dry eyes).   hydrocortisone (ANUSOL-HC) 2.5 % rectal cream Place 1 application rectally 3 (three) times daily as needed for hemorrhoids.   indapamide (LOZOL) 2.5 MG tablet Take 2.5 mg by mouth every morning.    MAGNESIUM PO Take 1 tablet by mouth at bedtime as needed (relax nervew).   Potassium Citrate 15 MEQ (1620 MG) TBCR Take 15 mEq by mouth in the morning and at bedtime.   PREBIOTIC PRODUCT PO Take by mouth.   prednisoLONE acetate (PRED FORTE) 1 % ophthalmic suspension Instill 1 drop TID in operative eye(s) starting 2 days prior to surgery and 3 weeks following surgery   rosuvastatin (CRESTOR) 20 MG tablet TAKE 1 TABLET EVERY DAY   Physical Exam:    VS:  BP 132/84   Pulse 68   Ht 5\' 1"  (1.549 m)   Wt 140 lb (63.5 kg)   SpO2 97%   BMI 26.45 kg/m    Wt Readings from Last 3 Encounters:  07/20/23 140 lb (63.5 kg)  02/13/22 157 lb 3.2 oz (71.3 kg)  01/09/22 154 lb 5.2 oz (70 kg)    GEN: Well nourished, well developed in no acute distress NECK: No JVD; No carotid bruits CARDIAC: RRR, no murmurs, rubs, gallops RESPIRATORY:  Clear to auscultation without rales, wheezing or rhonchi  ABDOMEN: Soft, non-tender, non-distended EXTREMITIES:  No edema; No acute deformity   Asessement and Plan:.    CAD: s/p CABG x 3 with LIMA to LAD, SVG to diagonal, SVG to PDA with Dr. Laneta Simmers. Stable with no anginal symptoms. No indication for ischemic evaluation.  Heart healthy diet and regular cardiovascular exercise encouraged.  Check CBC and CMET. Continue aspirin 81 mg and rosuvastatin 20 mg daily.  Postoperative atrial fibrillation/bradycardia: Patient had postoperative atrial fibrillation at time of her CABG in 08/2021 and was discharged on amiodarone.  This was later discontinued in setting of bradycardia with associated lightheadedness and near syncope. Today she reports that she is doing well, denies palpitations or feelings of fast heart rates.  Denies presyncope or syncope. EKG today indicated NSR at 68 bpm.   HLD: No recent lipid profile on file. Check fasting lipid profile and CMET.  Patient reports that she is currently taking rosuvastatin 10 mg daily.  HTN: Blood pressure today 140/81, on recheck  was 132/84.  Patient reports that her blood pressure is very well-controlled at home, this morning it was 110/83.  Continue current antihypertensive regimen.   Disposition: F/u with Dr. Rennis Odonnell in three months, per patient request.   Signed, Rip Harbour, NP

## 2023-07-20 ENCOUNTER — Encounter: Payer: Self-pay | Admitting: Cardiology

## 2023-07-20 ENCOUNTER — Ambulatory Visit: Payer: Medicare Other | Attending: Cardiology | Admitting: Cardiology

## 2023-07-20 VITALS — BP 132/84 | HR 68 | Ht 61.0 in | Wt 140.0 lb

## 2023-07-20 DIAGNOSIS — Z951 Presence of aortocoronary bypass graft: Secondary | ICD-10-CM | POA: Insufficient documentation

## 2023-07-20 DIAGNOSIS — I10 Essential (primary) hypertension: Secondary | ICD-10-CM | POA: Insufficient documentation

## 2023-07-20 DIAGNOSIS — E785 Hyperlipidemia, unspecified: Secondary | ICD-10-CM | POA: Diagnosis present

## 2023-07-20 DIAGNOSIS — I9789 Other postprocedural complications and disorders of the circulatory system, not elsewhere classified: Secondary | ICD-10-CM | POA: Diagnosis not present

## 2023-07-20 DIAGNOSIS — I25708 Atherosclerosis of coronary artery bypass graft(s), unspecified, with other forms of angina pectoris: Secondary | ICD-10-CM | POA: Insufficient documentation

## 2023-07-20 DIAGNOSIS — I4891 Unspecified atrial fibrillation: Secondary | ICD-10-CM | POA: Diagnosis present

## 2023-07-20 NOTE — Patient Instructions (Signed)
Medication Instructions:  No changes *If you need a refill on your cardiac medications before your next appointment, please call your pharmacy*  Lab Work: We will need for you to get done in the next couple weeks a fasting lipid panel, CBC, and Bmet If you have labs (blood work) drawn today and your tests are completely normal, you will receive your results only by: MyChart Message (if you have MyChart) OR A paper copy in the mail If you have any lab test that is abnormal or we need to change your treatment, we will call you to review the results.  Testing/Procedures: No testing  Follow-Up: At [facility], you and your health needs are our priority.  As part of our continuing mission to provide you with exceptional heart care, we have created designated Provider Care Teams.  These Care Teams include your primary Cardiologist (physician) and Advanced Practice Providers (APPs -  Physician Assistants and Nurse Practitioners) who all work together to provide you with the care you need, when you need it.  We recommend signing up for the patient portal called "MyChart".  Sign up information is provided on this After Visit Summary.  MyChart is used to connect with patients for Virtual Visits (Telemedicine).  Patients are able to view lab/test results, encounter notes, upcoming appointments, etc.  Non-urgent messages can be sent to your provider as well.   To learn more about what you can do with MyChart, go to [email or web address].    Your next appointment:   3 month(s)  Provider:   Chrystie Nose, MD    Other Instructions

## 2023-08-01 ENCOUNTER — Telehealth: Payer: Self-pay

## 2023-08-01 DIAGNOSIS — Z951 Presence of aortocoronary bypass graft: Secondary | ICD-10-CM

## 2023-08-01 DIAGNOSIS — I4891 Unspecified atrial fibrillation: Secondary | ICD-10-CM

## 2023-08-01 DIAGNOSIS — I25708 Atherosclerosis of coronary artery bypass graft(s), unspecified, with other forms of angina pectoris: Secondary | ICD-10-CM

## 2023-08-01 LAB — CBC
Hematocrit: 43 % (ref 34.0–46.6)
Hemoglobin: 14.2 g/dL (ref 11.1–15.9)
MCH: 31.6 pg (ref 26.6–33.0)
MCHC: 33 g/dL (ref 31.5–35.7)
MCV: 96 fL (ref 79–97)
Platelets: 259 10*3/uL (ref 150–450)
RBC: 4.5 x10E6/uL (ref 3.77–5.28)
RDW: 11.6 % — ABNORMAL LOW (ref 11.7–15.4)
WBC: 6.7 10*3/uL (ref 3.4–10.8)

## 2023-08-01 LAB — COMPREHENSIVE METABOLIC PANEL
ALT: 16 [IU]/L (ref 0–32)
AST: 22 [IU]/L (ref 0–40)
Albumin: 4.5 g/dL (ref 3.8–4.8)
Alkaline Phosphatase: 92 [IU]/L (ref 44–121)
BUN/Creatinine Ratio: 29 — ABNORMAL HIGH (ref 12–28)
BUN: 28 mg/dL — ABNORMAL HIGH (ref 8–27)
Bilirubin Total: 0.7 mg/dL (ref 0.0–1.2)
CO2: 25 mmol/L (ref 20–29)
Calcium: 10.4 mg/dL — ABNORMAL HIGH (ref 8.7–10.3)
Chloride: 100 mmol/L (ref 96–106)
Creatinine, Ser: 0.97 mg/dL (ref 0.57–1.00)
Globulin, Total: 3.3 g/dL (ref 1.5–4.5)
Glucose: 89 mg/dL (ref 70–99)
Potassium: 5.4 mmol/L — ABNORMAL HIGH (ref 3.5–5.2)
Sodium: 142 mmol/L (ref 134–144)
Total Protein: 7.8 g/dL (ref 6.0–8.5)
eGFR: 62 mL/min/{1.73_m2} (ref >59)

## 2023-08-01 LAB — LIPID PANEL
Chol/HDL Ratio: 2.7 {ratio} (ref 0.0–4.4)
Cholesterol, Total: 190 mg/dL (ref 100–199)
HDL: 70 mg/dL (ref >39)
LDL Chol Calc (NIH): 105 mg/dL — ABNORMAL HIGH (ref 0–99)
Triglycerides: 85 mg/dL (ref 0–149)
VLDL Cholesterol Cal: 15 mg/dL (ref 5–40)

## 2023-08-01 NOTE — Telephone Encounter (Signed)
Left message to call back

## 2023-08-01 NOTE — Telephone Encounter (Signed)
-----   Message from Rip Harbour sent at 08/01/2023 10:05 AM EST ----- Please let Tiffany Odonnell know that her CBC shows no evidence of anemia or infection. Her kidney function is normal, her potassium is elevated. Please have her stop her potassium supplement and decrease intake of high potassium foods such as bananas, squash, yogurt, white beans, sweet potatoes, leafy greens, and avocados. Repeat a BMET in one week. Her LDL or bad cholesterol is not at goal, at office visit she reported she was only taking Rosuvastatin 10 mg daily, recommend increasing back to 20 mg daily with repeat fasting lipid profile and LFTs in 8 weeks.

## 2023-08-02 MED ORDER — ROSUVASTATIN CALCIUM 40 MG PO TABS: 40.0000 mg | ORAL_TABLET | Freq: Every day | ORAL | 3 refills | Status: DC

## 2023-08-02 NOTE — Telephone Encounter (Signed)
-----   Message from Rip Harbour sent at 08/01/2023 10:05 AM EST ----- Please let Tiffany Odonnell know that her CBC shows no evidence of anemia or infection. Her kidney function is normal, her potassium is elevated. Please have her stop her potassium supplement and decrease intake of high potassium foods such as bananas, squash, yogurt, white beans, sweet potatoes, leafy greens, and avocados. Repeat a BMET in one week. Her LDL or bad cholesterol is not at goal, at office visit she reported she was only taking Rosuvastatin 10 mg daily, recommend increasing back to 20 mg daily with repeat fasting lipid profile and LFTs in 8 weeks.

## 2023-08-03 IMAGING — MG MM DIGITAL SCREENING BILAT W/ TOMO AND CAD
8 series · 8 of 24 positions shown · non-contrast
Comparison: Previous exam(s).

CLINICAL DATA: Screening.

EXAM:
DIGITAL SCREENING BILATERAL MAMMOGRAM WITH TOMOSYNTHESIS AND CAD
TECHNIQUE: Bilateral screening digital craniocaudal and mediolateral oblique
mammograms were obtained. Bilateral screening digital breast
tomosynthesis was performed. The images were evaluated with
computer-aided detection.

[L MLO synth-2D]
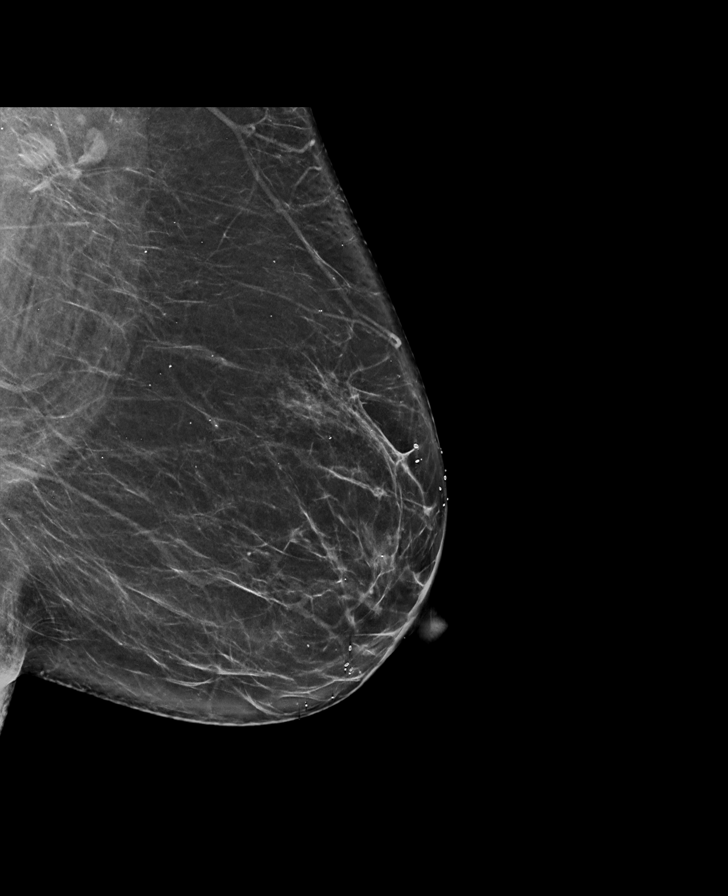

[R MLO synth-2D]
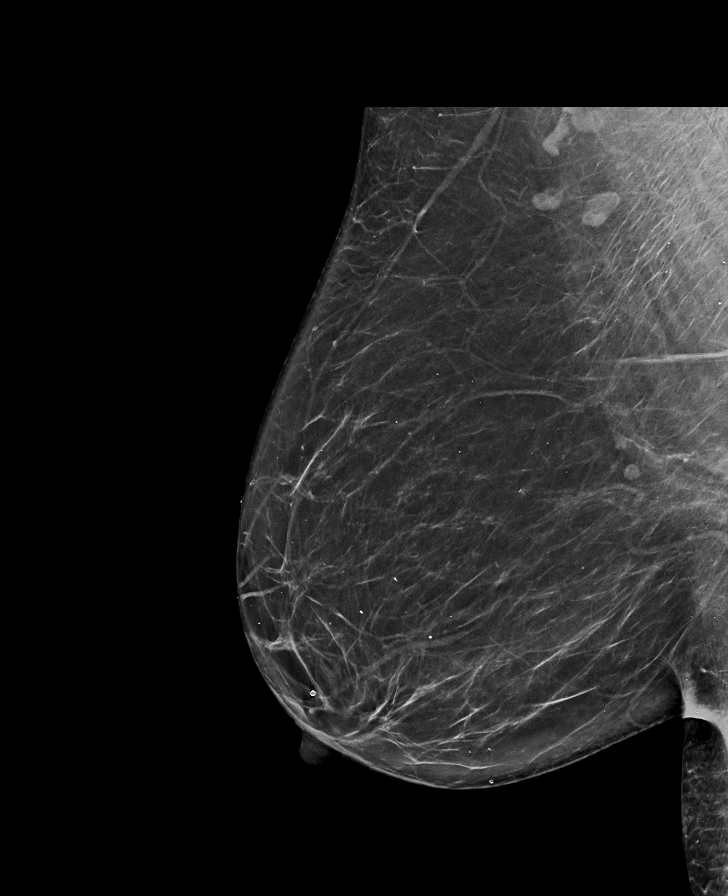

[L CC synth-2D]
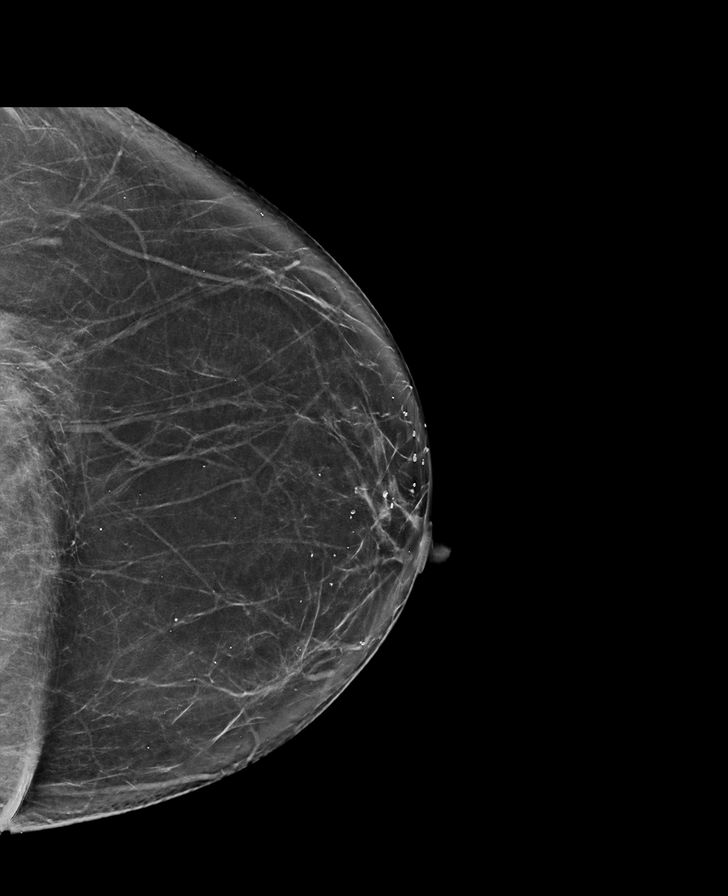

[R CC synth-2D]
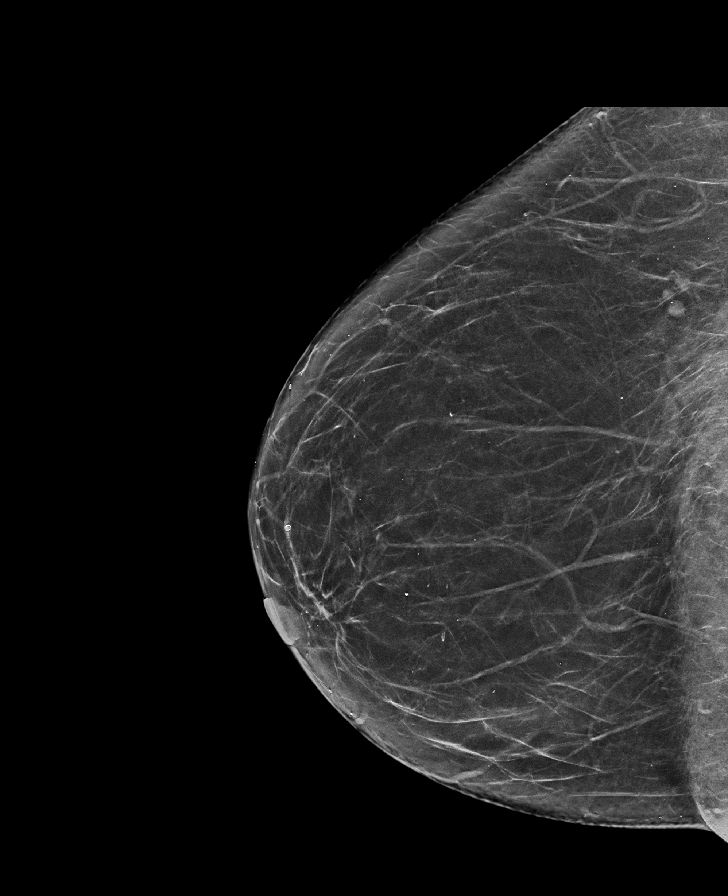

[L CC tomo · tomo slice 39/76.0]
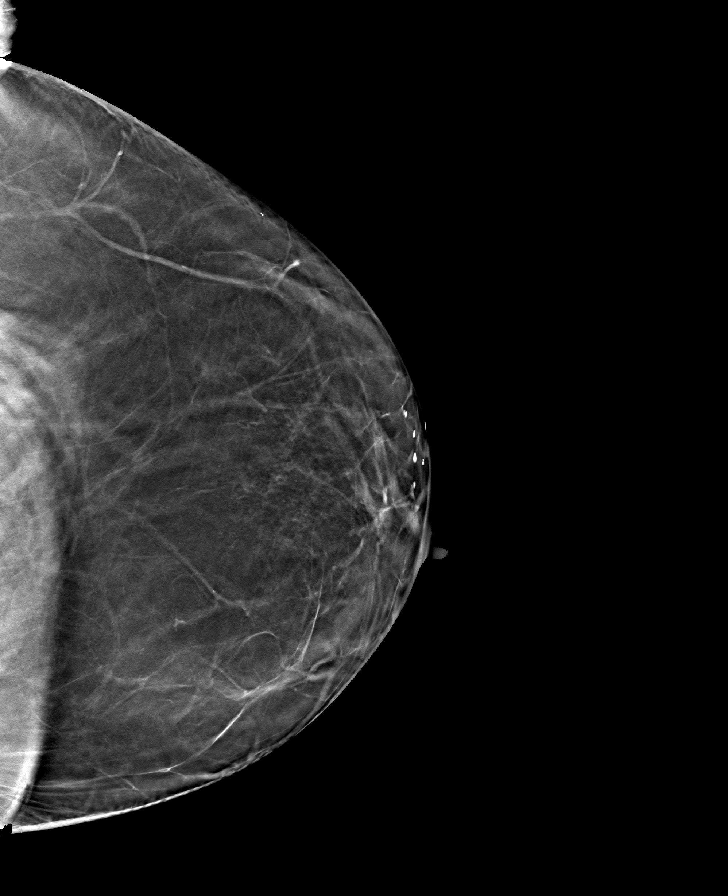

[R MLO tomo · tomo slice 43/84.0]
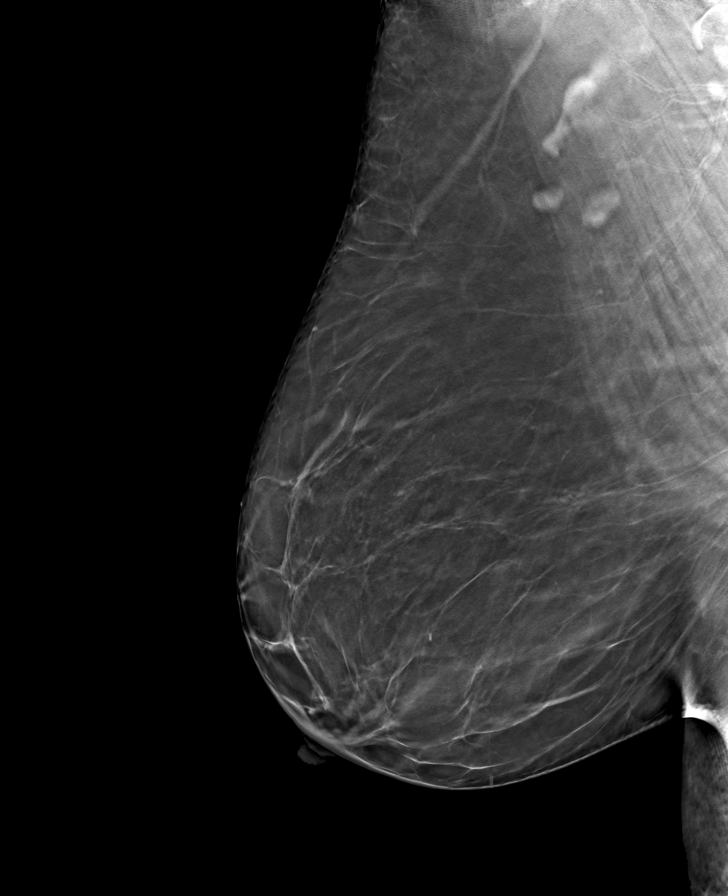

[L MLO tomo · tomo slice 40/79.0]
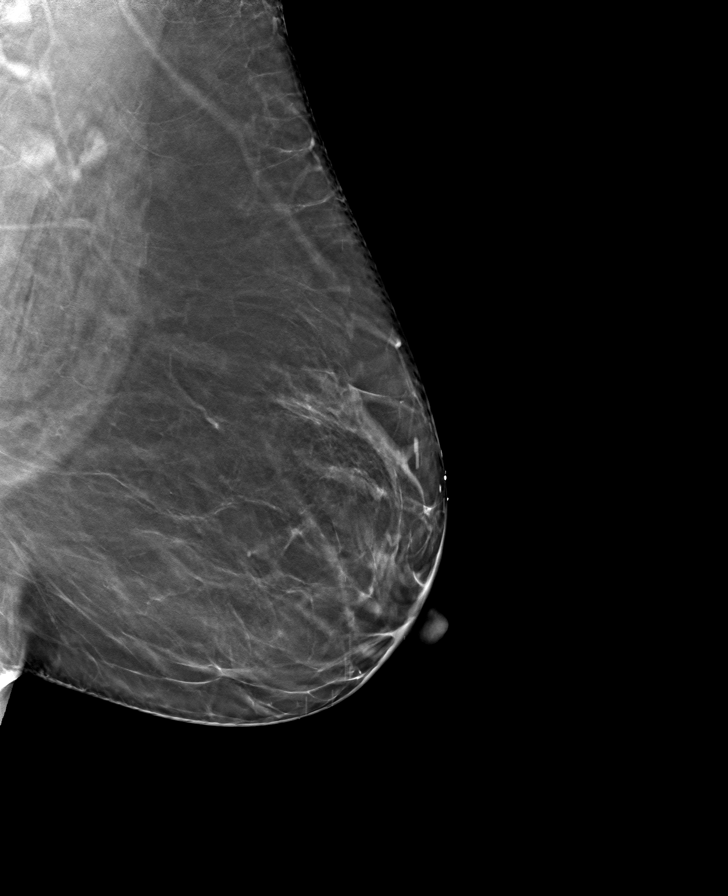

[R CC tomo · tomo slice 37/73.0]
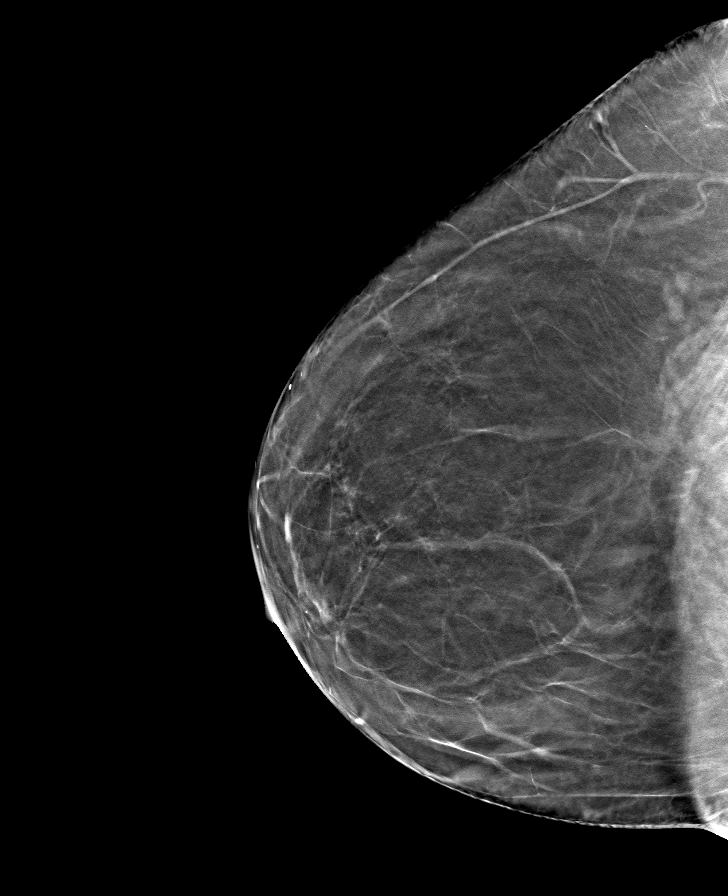

[8 of 24 positions shown; findings below may reference images not displayed]

ACR Breast Density Category b: There are scattered areas of
fibroglandular density.
FINDINGS: There are no findings suspicious for malignancy.
IMPRESSION: No mammographic evidence of malignancy. A result letter of this
screening mammogram will be mailed directly to the patient.

RECOMMENDATION:
Screening mammogram in one year. (Code:51-O-LD2)

BI-RADS CATEGORY  1: Negative.

## 2023-08-03 MED ORDER — ROSUVASTATIN CALCIUM 20 MG PO TABS: 20.0000 mg | ORAL_TABLET | Freq: Every day | ORAL | 3 refills | Status: DC

## 2023-08-03 NOTE — Addendum Note (Signed)
Addended by: Clotilde Dieter on: 08/03/2023 04:21 PM   Modules accepted: Orders

## 2023-08-17 ENCOUNTER — Other Ambulatory Visit: Payer: Self-pay

## 2023-08-17 DIAGNOSIS — Z951 Presence of aortocoronary bypass graft: Secondary | ICD-10-CM

## 2023-08-17 DIAGNOSIS — I4891 Unspecified atrial fibrillation: Secondary | ICD-10-CM

## 2023-08-17 DIAGNOSIS — I25708 Atherosclerosis of coronary artery bypass graft(s), unspecified, with other forms of angina pectoris: Secondary | ICD-10-CM

## 2023-08-17 DIAGNOSIS — E785 Hyperlipidemia, unspecified: Secondary | ICD-10-CM

## 2023-08-17 DIAGNOSIS — I9789 Other postprocedural complications and disorders of the circulatory system, not elsewhere classified: Secondary | ICD-10-CM

## 2023-08-17 LAB — HEPATIC FUNCTION PANEL
ALT: 31 [IU]/L (ref 0–32)
AST: 38 [IU]/L (ref 0–40)
Albumin: 4.3 g/dL (ref 3.8–4.8)
Alkaline Phosphatase: 84 [IU]/L (ref 44–121)
Bilirubin Total: 0.2 mg/dL (ref 0.0–1.2)
Bilirubin, Direct: 0.12 mg/dL (ref 0.00–0.40)
Total Protein: 7.8 g/dL (ref 6.0–8.5)

## 2023-08-17 LAB — LIPID PANEL
Chol/HDL Ratio: 2.2 {ratio} (ref 0.0–4.4)
Cholesterol, Total: 139 mg/dL (ref 100–199)
HDL: 62 mg/dL (ref >39)
LDL Chol Calc (NIH): 61 mg/dL (ref 0–99)
Triglycerides: 81 mg/dL (ref 0–149)
VLDL Cholesterol Cal: 16 mg/dL (ref 5–40)

## 2023-08-21 ENCOUNTER — Telehealth: Payer: Self-pay

## 2023-08-21 DIAGNOSIS — Z79899 Other long term (current) drug therapy: Secondary | ICD-10-CM

## 2023-08-21 NOTE — Telephone Encounter (Signed)
-----   Message from Joylene Grapes sent at 08/20/2023  5:23 PM EST ----- Covering Tiffany Odonnell's inbox. Recent labs show cholesterol is at goal, liver enzymes are stable. Continue current medications and follow-up as planned. Thank you-EM

## 2023-08-21 NOTE — Telephone Encounter (Signed)
 Left message to call back

## 2023-08-22 NOTE — Telephone Encounter (Signed)
Patient identification verified by 2 forms. Tiffany Rail, RN    Called and spoke to patient  Relayed provider message below  Patient states:   -previous lab showed elevated potassium and calcium   -she had d/c the potassium   -she is followed by nephrologist  Informed patient:   -per chart review provide did want repeat BMET   -order placed   -can present to lab at earliest convenience  Patient has no further questions at this time    Per 1/28 lab result note, NP west requested repeat BMET in 1 week, order not placed

## 2023-08-22 NOTE — Telephone Encounter (Signed)
 Pt is returning call.

## 2023-09-11 LAB — BASIC METABOLIC PANEL
BUN/Creatinine Ratio: 28 (ref 12–28)
BUN: 24 mg/dL (ref 8–27)
CO2: 25 mmol/L (ref 20–29)
Calcium: 10.1 mg/dL (ref 8.7–10.3)
Chloride: 101 mmol/L (ref 96–106)
Creatinine, Ser: 0.86 mg/dL (ref 0.57–1.00)
Glucose: 111 mg/dL — ABNORMAL HIGH (ref 70–99)
Potassium: 4.7 mmol/L (ref 3.5–5.2)
Sodium: 141 mmol/L (ref 134–144)
eGFR: 72 mL/min/{1.73_m2} (ref >59)

## 2023-09-13 ENCOUNTER — Telehealth: Payer: Self-pay

## 2023-09-13 NOTE — Telephone Encounter (Signed)
 Left message to call back

## 2023-09-13 NOTE — Telephone Encounter (Signed)
-----   Message from Rip Harbour sent at 09/12/2023 11:00 AM EDT ----- Please let Ms. Circle know that her kidney function is normal and her potassium level is normal. Good result! Continue current medications and follow up as planned.

## 2023-09-18 NOTE — Telephone Encounter (Signed)
 Left message to call back

## 2023-09-19 ENCOUNTER — Telehealth: Payer: Self-pay

## 2023-09-19 NOTE — Telephone Encounter (Signed)
 Called patient advised of below they verbalized understanding.

## 2023-10-31 ENCOUNTER — Ambulatory Visit: Payer: Medicare Other | Attending: Internal Medicine | Admitting: Internal Medicine

## 2023-10-31 ENCOUNTER — Encounter: Payer: Self-pay | Admitting: Internal Medicine

## 2023-10-31 VITALS — BP 114/70 | HR 60 | Ht 61.0 in | Wt 132.0 lb

## 2023-10-31 DIAGNOSIS — Z951 Presence of aortocoronary bypass graft: Secondary | ICD-10-CM | POA: Insufficient documentation

## 2023-10-31 DIAGNOSIS — I10 Essential (primary) hypertension: Secondary | ICD-10-CM | POA: Diagnosis not present

## 2023-10-31 DIAGNOSIS — E785 Hyperlipidemia, unspecified: Secondary | ICD-10-CM | POA: Insufficient documentation

## 2023-10-31 NOTE — Patient Instructions (Signed)
 Medication Instructions:  NO CHANGES  Follow-Up: At [facility], you and your health needs are our priority.  As part of our continuing mission to provide you with exceptional heart care, our providers are all part of one team.  This team includes your primary Cardiologist (physician) and Advanced Practice Providers or APPs (Physician Assistants and Nurse Practitioners) who all work together to provide you with the care you need, when you need it.  Your next appointment:   12 months with Dr. Maximo Spar

## 2023-10-31 NOTE — Progress Notes (Signed)
 OFFICE NOTE  Chief Complaint:  Follow-up CABG  Primary Care Physician: Janifer Meigs, FNP  HPI:  Tiffany Odonnell is a 73 y.o. female with a past medial history significant for with a hx of CAD s/p CABG, HTN, HLD, fibromyalgia, DMII, Sjogren's last seen 09/19/21.  Presented to the ED 08/01/21 with chest pain. HS toponin 73 ? 433. Echo 08/02/21 LVEF 60-65%, gr1DD. Cardiac catheterization 08/03/21 with pLAD 75% stenosed, prox-mid LAD 100% stenosis, right to left collaterals, 1st diagonal 95% stenosed. On 08/05/21 underwent CABG x3. She developed postoperative atrial fibrillation and converted with Amiodarone .   Seen 08/31/21 by cardiothoracic surgeon doing well. Plavix  discontinued due to nosebleed and Aspirin  continued. Amiodarone  decreased to 200mg  daily.   Seen in cardiology clinic 09/19/21 doing well, walking 1-2 blocks without dyspnea. Still with some shoulder and neck pain. No changes were made at that time.  Contacted the office 10/24/21 noting heart rate 49 bpm with feeling faint, lightheaded. Bp at that time 130/78. A few minutes later BP 143/77 and HR 60 bpm.   She presents today for follow up with her daughter. Pleasant lady who is originally from [place]. Tells me two days ago symptoms started. She felt very jittery. That morning she had gotten up to go for a walk. During her walk felt she wasn't focusing well. Tells me she felt "off" described as nervous, lightheaded. She has not had dizziness or sensation of room spinning. She feels more lightheaded as if she needs to sit down. Tells me her balance feels affected - it was not good prior to surgery but feels worse since surgery. Notes her tinnitus has also increased since her surgery.  02/13/2022  Tiffany Odonnell returns today for follow-up of CABG.  She had recently been having some bradycardia.  She wore a monitor for 2 weeks which showed some nocturnal bradycardia with heart rate in the 30s but generally in the mid 50s  with no significant arrhythmias.  She was taken off of amiodarone  and her atenolol  was decreased from 50 to 25 mg daily.  Heart rate remains in the 50s.  She is walking and does cardiac rehab.  She is noted some shortness of breath with exertion but no chest pain.  Blood pressure was a little elevated today at 145/88.  I repeated that it was 142/88.  She says her home blood pressures are in the 1 10-1 20 range.  She reports having some whitecoat hypertension.  She has not followed up with her PCP since surgery.  She has reported some hot flashes.  She reports somewhat of a stiff neck.  She had eye surgery last week.  She has not had repeat lipids since January when she had presented for cardiac catheterization.  She is on 20 mg rosuvastatin .  She also had an increase in her creatinine up to 1.4 but is struggled with recurrent nephrolithiasis.  She will need repeat renal panel as well.  10/31/2023  Tiffany Odonnell (maiden name) is seen today in follow-up.  In general from a cardiac standpoint I think she is doing well.  Her blood pressure is excellent today 114/70.  Cholesterol showed total 139, triglycerides 81, HDL 62 and LDL 61 on rosuvastatin  20 mg daily.  She is denying any chest pain or shortness of breath.  She is now about a year and a half out from CABG.  She was last seen in January 2025 by Katlyn West, NP and was doing well.  PMHx:  Past  Medical History:  Diagnosis Date   Anxiety    Blood in urine    Cervical disc disorder    bulging disc   Coronary artery disease    Diabetes mellitus without complication (HCC)    Dizziness    Falls    Fibromyalgia    H/O Salmonella gastroenteritis    High cholesterol    History of frequent urinary tract infections    History of hiatal hernia    History of vertigo    Hypertension    Imbalance    Kidney stones    Lichen planopilaris    Lumbar herniated disc    L5-L6   Numbness and tingling    hands and feet bilat    Oscillopsia    Peripheral  neuropathy    Septicemia (HCC)    2013   Sinusitis    Sjogren's disease (HCC)    Tinnitus    Wears glasses     Past Surgical History:  Procedure Laterality Date   ABDOMINAL HYSTERECTOMY     2014   CARDIAC CATHETERIZATION     CORONARY ARTERY BYPASS GRAFT N/A 08/05/2021   Procedure: CORONARY ARTERY BYPASS GRAFTING (CABG) TIMES THREE, USING LEFT INTERNAL MAMMARTY ARTERY AND ENDOSCOPICALLY HARVESTED RIGHT GREATER SAPHENOUS VEIN;  Surgeon: Bartley Lightning, MD;  Location: [facility] OR;  Service: Open Heart Surgery;  Laterality: N/A;   ENDOVEIN HARVEST OF GREATER SAPHENOUS VEIN Right 08/05/2021   Procedure: ENDOVEIN HARVEST OF GREATER SAPHENOUS VEIN;  Surgeon: Bartley Lightning, MD;  Location: [facility] OR;  Service: Open Heart Surgery;  Laterality: Right;   IR URETERAL STENT RIGHT NEW ACCESS W/O SEP NEPHROSTOMY CATH  07/15/2021   KNEE ARTHROSCOPY WITH MEDIAL MENISECTOMY Left 10/29/2019   Procedure: KNEE ARTHROSCOPY WITH MEDIAL AND LATERAL MENISECTOMY WITH DEBRIDEMENT;  Surgeon: Orvan Blanch, MD;  Location: [facility];  Service: Orthopedics;  Laterality: Left;  60 mins   LEFT HEART CATH AND CORONARY ANGIOGRAPHY N/A 08/02/2021   Procedure: LEFT HEART CATH AND CORONARY ANGIOGRAPHY;  Surgeon: Lucendia Rusk, MD;  Location: [facility] INVASIVE CV LAB;  Service: Cardiovascular;  Laterality: N/A;   NASAL SINUS SURGERY     NEPHROLITHOTOMY Right 05/31/2015   Procedure: RIGHT PERCUTANEOUS NEPHROLITHOTOMY  ;  Surgeon: Mark Ottelin, MD;  Location: [facility];  Service: Urology;  Laterality: Right;   NEPHROLITHOTOMY Right 07/15/2021   Procedure: NEPHROLITHOTOMY PERCUTANEOUS;  Surgeon: Lahoma Pigg, MD;  Location: [facility];  Service: Urology;  Laterality: Right;   prolapsed bladder and rectocele surgery      TEE WITHOUT CARDIOVERSION N/A 08/05/2021   Procedure: TRANSESOPHAGEAL ECHOCARDIOGRAM (TEE);  Surgeon: Bartley Lightning, MD;  Location: [facility];  Service: Open Heart Surgery;  Laterality: N/A;   TUBAL LIGATION     1983    URETERAL STENT PLACEMENT     times 2 secondary to kidney stone     FAMHx:  Family History  Problem Relation Age of Onset   CAD Mother    Diabetes Father    Glaucoma Maternal Uncle    Breast cancer Cousin    Non-Hodgkin's lymphoma Brother    Non-Hodgkin's lymphoma Brother    Non-Hodgkin's lymphoma Brother        half brother   Lupus Other    Hypertension Other    Diabetes Other     SOCHx:   reports that she has never smoked. She has never used smokeless tobacco. She reports that she does not drink alcohol and does not use drugs.  ALLERGIES:  Allergies  Allergen Reactions   Augmentin  [Amoxicillin -Pot Clavulanate] Nausea And Vomiting   Ciprofloxacin Hives   Sulfa Antibiotics Other (See Comments)    Trembling, paralyzing pain, red eyes, skin crawling   Latex Itching   Macrolides And Ketolides Diarrhea and Nausea And Vomiting   Tetracyclines & Related Other (See Comments) and Tinitus    Vertigo    ROS: Pertinent items noted in HPI and remainder of comprehensive ROS otherwise negative.  HOME MEDS: Current Outpatient Medications on File Prior to Visit  Medication Sig Dispense Refill   acetaminophen  (TYLENOL ) 500 MG tablet Take 1,000 mg by mouth every 6 (six) hours as needed for headache (pain).     aspirin  EC 81 MG tablet Take 1 tablet (81 mg total) by mouth daily. 14 tablet 1   betamethasone dipropionate 0.05 % lotion Apply 1 application topically 2 (two) times daily as needed (scalp rash).  2   Carboxymethylcellulose Sodium (THERATEARS OP) Place 1 drop into both eyes 2 (two) times daily as needed (dry eyes).     co-enzyme Q-10 30 MG capsule Take 30 mg by mouth daily.     hydrocortisone  (ANUSOL -HC) 2.5 % rectal cream Place 1 application rectally 3 (three) times daily as needed for hemorrhoids.     indapamide  (LOZOL ) 2.5 MG tablet Take 2.5 mg by mouth every morning.     MAGNESIUM  PO Take 1 tablet by mouth at bedtime as needed (relax nervew).     Nutritional Supplements  (ANTI-INFLAMMATORY ENZYME PO) Take by mouth 2 (two) times daily.     rosuvastatin  (CRESTOR ) 20 MG tablet Take 1 tablet (20 mg total) by mouth daily. 90 tablet 3   Ascorbic Acid  (VITAMIN C) 1000 MG tablet Take 2,000 mg by mouth daily as needed (immune support). (Patient not taking: Reported on 10/31/2023)     atenolol  (TENORMIN ) 25 MG tablet Take 1 tablet (25 mg total) by mouth daily. 90 tablet 3   ketorolac  (ACULAR ) 0.5 % ophthalmic solution Instill 1 drop TID in operative eye(s) starting 2 days prior to surgery and 3 weeks following surgery. (Patient not taking: Reported on 10/31/2023)     Multiple Vitamins-Minerals (ZINC  PO) Take 1 tablet by mouth at bedtime as needed (to relax nerves). (Patient not taking: Reported on 10/31/2023)     OVER THE COUNTER MEDICATION LIV PURE (Patient not taking: Reported on 10/31/2023)     Potassium Citrate  15 MEQ (1620 MG) TBCR Take 15 mEq by mouth in the morning and at bedtime. (Patient not taking: Reported on 10/31/2023)     PREBIOTIC PRODUCT PO Take by mouth. (Patient not taking: Reported on 10/31/2023)     prednisoLONE acetate (PRED FORTE) 1 % ophthalmic suspension Instill 1 drop TID in operative eye(s) starting 2 days prior to surgery and 3 weeks following surgery (Patient not taking: Reported on 10/31/2023)     tobramycin (TOBREX) 0.3 % ophthalmic solution Instill 1 drop TID in operative eye(s) starting 2 days prior to surgery and 3 weeks following surgery. (Patient not taking: Reported on 10/31/2023)     No current facility-administered medications on file prior to visit.    LABS/IMAGING: No results found for this or any previous visit (from the past 48 hours). No results found.  LIPID PANEL:    Component Value Date/Time   CHOL 139 08/17/2023 1010   TRIG 81 08/17/2023 1010   HDL 62 08/17/2023 1010   CHOLHDL 2.2 08/17/2023 1010   CHOLHDL 4.0 08/02/2021 0400   VLDL 20 08/02/2021 0400   LDLCALC 61 08/17/2023  1010     WEIGHTS: Wt Readings from Last 3  Encounters:  10/31/23 132 lb (59.9 kg)  07/20/23 140 lb (63.5 kg)  02/13/22 157 lb 3.2 oz (71.3 kg)    VITALS: BP 114/70 (BP Location: Left Arm, Patient Position: Sitting, Cuff Size: Normal)   Pulse 60   Ht 5\' 1"  (1.549 m)   Wt 132 lb (59.9 kg)   SpO2 97%   BMI 24.94 kg/m   EXAM: General appearance: alert and no distress Neck: no carotid bruit, no JVD, and thyroid not enlarged, symmetric, no tenderness/mass/nodules Lungs: clear to auscultation bilaterally Heart: regular rate and rhythm, S1, S2 normal, no murmur, click, rub or gallop Abdomen: soft, non-tender; bowel sounds normal; no masses,  no organomegaly Extremities: extremities normal, atraumatic, no cyanosis or edema Pulses: 2+ and symmetric Skin: Skin color, texture, turgor normal. No rashes or lesions Neurologic: Grossly normal Psych: Pleasant  EKG: Deferred  ASSESSMENT: CAD status post CABG x3 for NSTEMI in January 2023 (LIMA to LAD, SVG to diagonal, SVG to PDA) -Dr. Sherene Dilling Postoperative atrial fibrillation Type 2 diabetes Dyslipidemia, goal LDL less than 70 Hypertension History of Sjogren's disease Peripheral neuropathy Recurrent nephrolithiasis  PLAN: 1.   Ms. Bartkiewicz seems to be doing well now 2 years out from bypass surgery.  No recurrent A-fib has been noted.  Her cholesterol is well treated at target.  Blood pressure is at goal.  No changes to her medicines today.  Follow-up with us  annually or sooner as necessary.  Hazle Lites, MD, [facility], FNLA, FACP  [place]  [facility]  Medical Director of the [facility]  Cardiovascular Risk Reduction Clinic Diplomate of the American Board of Clinical Lipidology Attending Cardiologist  Direct Dial: [phone]  Fax: [phone]  Website:  [email or web address]   Aviva Lemmings Elenie Coven 10/31/2023, 10:45 AM

## 2023-11-12 ENCOUNTER — Ambulatory Visit: Admitting: Podiatry

## 2023-11-23 ENCOUNTER — Encounter: Payer: Self-pay | Admitting: Neurology

## 2023-11-28 ENCOUNTER — Ambulatory Visit (INDEPENDENT_AMBULATORY_CARE_PROVIDER_SITE_OTHER): Admitting: Podiatry

## 2023-11-28 DIAGNOSIS — B351 Tinea unguium: Secondary | ICD-10-CM | POA: Diagnosis not present

## 2023-11-28 MED ORDER — TERBINAFINE HCL 250 MG PO TABS: 250.0000 mg | ORAL_TABLET | Freq: Every day | ORAL | 0 refills | Status: DC

## 2023-11-28 NOTE — Progress Notes (Signed)
 Chief Complaint  Patient presents with   Nail Problem    RM 8 Patient is here for nail fungus on right/left hallux and a splinter in left third toe.(Diabetic A1C 5.6)    Subjective: 73 y.o. female presenting today for evaluation of discoloration and thickening to the bilateral toenails especially the bilateral great toes.  Onset for several years.  She has tried topical antifungal with no improvement.  Past Medical History:  Diagnosis Date   Anxiety    Blood in urine    Cervical disc disorder    bulging disc   Coronary artery disease    Diabetes mellitus without complication (HCC)    Dizziness    Falls    Fibromyalgia    H/O Salmonella gastroenteritis    High cholesterol    History of frequent urinary tract infections    History of hiatal hernia    History of vertigo    Hypertension    Imbalance    Kidney stones    Lichen planopilaris    Lumbar herniated disc    L5-L6   Numbness and tingling    hands and feet bilat    Oscillopsia    Peripheral neuropathy    Septicemia (HCC)    2013   Sinusitis    Sjogren's disease (HCC)    Tinnitus    Wears glasses     Past Surgical History:  Procedure Laterality Date   ABDOMINAL HYSTERECTOMY     2014   CARDIAC CATHETERIZATION     CORONARY ARTERY BYPASS GRAFT N/A 08/05/2021   Procedure: CORONARY ARTERY BYPASS GRAFTING (CABG) TIMES THREE, USING LEFT INTERNAL MAMMARTY ARTERY AND ENDOSCOPICALLY HARVESTED RIGHT GREATER SAPHENOUS VEIN;  Surgeon: Bartley Lightning, MD;  Location: [facility] OR;  Service: Open Heart Surgery;  Laterality: N/A;   ENDOVEIN HARVEST OF GREATER SAPHENOUS VEIN Right 08/05/2021   Procedure: ENDOVEIN HARVEST OF GREATER SAPHENOUS VEIN;  Surgeon: Bartley Lightning, MD;  Location: [facility] OR;  Service: Open Heart Surgery;  Laterality: Right;   IR URETERAL STENT RIGHT NEW ACCESS W/O SEP NEPHROSTOMY CATH  07/15/2021   KNEE ARTHROSCOPY WITH MEDIAL MENISECTOMY Left 10/29/2019   Procedure: KNEE ARTHROSCOPY WITH MEDIAL AND LATERAL  MENISECTOMY WITH DEBRIDEMENT;  Surgeon: Orvan Blanch, MD;  Location: [facility];  Service: Orthopedics;  Laterality: Left;  60 mins   LEFT HEART CATH AND CORONARY ANGIOGRAPHY N/A 08/02/2021   Procedure: LEFT HEART CATH AND CORONARY ANGIOGRAPHY;  Surgeon: Lucendia Rusk, MD;  Location: [facility] INVASIVE CV LAB;  Service: Cardiovascular;  Laterality: N/A;   NASAL SINUS SURGERY     NEPHROLITHOTOMY Right 05/31/2015   Procedure: RIGHT PERCUTANEOUS NEPHROLITHOTOMY  ;  Surgeon: Mark Ottelin, MD;  Location: [facility];  Service: Urology;  Laterality: Right;   NEPHROLITHOTOMY Right 07/15/2021   Procedure: NEPHROLITHOTOMY PERCUTANEOUS;  Surgeon: Lahoma Pigg, MD;  Location: [facility];  Service: Urology;  Laterality: Right;   prolapsed bladder and rectocele surgery      TEE WITHOUT CARDIOVERSION N/A 08/05/2021   Procedure: TRANSESOPHAGEAL ECHOCARDIOGRAM (TEE);  Surgeon: Bartley Lightning, MD;  Location: [facility] OR;  Service: Open Heart Surgery;  Laterality: N/A;   TUBAL LIGATION     1983   URETERAL STENT PLACEMENT     times 2 secondary to kidney stone     Allergies  Allergen Reactions   Augmentin  [Amoxicillin -Pot Clavulanate] Nausea And Vomiting   Ciprofloxacin Hives   Sulfa Antibiotics Other (See Comments)    Trembling, paralyzing pain, red eyes, skin crawling  Latex Itching   Macrolides And Ketolides Diarrhea and Nausea And Vomiting   Tetracyclines & Related Other (See Comments) and Tinitus    Vertigo    Objective: Physical Exam General: The patient is alert and oriented x3 in no acute distress.  Dermatology: Hyperkeratotic, discolored, thickened, onychodystrophy noted. Skin is warm, dry and supple bilateral lower extremities. Negative for open lesions or macerations.  Vascular: Palpable pedal pulses bilaterally. No edema or erythema noted. Capillary refill within normal limits.  Neurological: Grossly intact via light touch  Musculoskeletal Exam: No pedal deformity noted  Assessment: #1  Onychomycosis of toenails bilateral  Plan of Care:  #1 Patient was evaluated. #2  Today we discussed different treatment options including oral, topical, and laser antifungal treatment modalities.  We discussed their efficacies and side effects.  Patient opts for oral antifungal treatment modality #3 prescription for Lamisil 250 mg #90 daily. Pt denies a history of liver pathology or symptoms.  LFT on 08/17/2023 WNL #4 return to clinic 6 months   Dot Gazella, DPM [facility]  Dr. Dot Gazella, DPM    [address] [place], [place] [address]                Office [phone]  Fax [phone]

## 2023-12-18 ENCOUNTER — Other Ambulatory Visit: Payer: Self-pay | Admitting: Endocrinology

## 2023-12-18 DIAGNOSIS — Z1231 Encounter for screening mammogram for malignant neoplasm of breast: Secondary | ICD-10-CM

## 2023-12-28 ENCOUNTER — Ambulatory Visit
Admission: RE | Admit: 2023-12-28 | Discharge: 2023-12-28 | Disposition: A | Discharge status: Home / Self Care | Source: Ambulatory Visit | Attending: Endocrinology | Admitting: Endocrinology

## 2023-12-28 DIAGNOSIS — Z1231 Encounter for screening mammogram for malignant neoplasm of breast: Secondary | ICD-10-CM

## 2023-12-28 NOTE — Progress Notes (Signed)
 Initial neurology clinic note  Tiffany Odonnell MRN: [id number] DOB: 1951-05-04  Referring provider: Elizbeth Leita Ruth, *  Primary care provider: Elizbeth Leita Ruth, FNP  Reason for consult:  imbalance  Subjective:  This is [name], a 73 y.o. left-handed female with a medical history of DM, HLD, HTN, nephrolithiasis, NSTEMI, cardiac arrest, fibromyalgia, vit D deficiency who presents to neurology clinic with imbalance. The patient is alone today.  Patient has had vertigo since she graduated from high school (age 17). She describes vertigo as room spinning very fast. This was after she got an antibiotic for an infection (tetracycline). She would have episodes once or twice a week. She feels like her vision was tilted and moving when she moves her head. When her head is still, she does not have the symptoms. She was living in [place] (1975) and had caloric testing on her ears. She does not know what it showed. She was sent to [facility] based on the results. She had extended work up including even cerebral angiogram. She states the doctors did not have an answer.  She was back in [place] a few weeks later and bent over and heard a loud explosion in her head then noticed she could not hear. She heard high pitch echoing after hitting a glass with a fork. She went back to the ear doctor that she originally saw in [place]. She was given water pills. She then became weak. 3 days later she heard another explosion and her hearing returned.  She continued to have intermittent vertigo.  She saw ENT around 2001. She also saw neurology who felt symptoms were due to the antibiotics.  PCP notes mentions Oscillopsia. Of note, Ophthalmology report from 02/01/22 does not mention this (cataract follow up).  Currently, patient has imbalance. This is her concern today and why she is here. She thinks she is weak in her hands and legs. She has numbness and tingling in feet and hands  and in the right lateral thigh. She endorses low back pain. She has had this since the 1980s. She takes anti-oxidants for this. She was prescribed gabapentin at one time but it caused delay in speech, so she does not take it anymore.  She denies recent falls.  Of note, she has a history of fibromyalgia but does not take anything for it.  She does not really get vertigo now.  EtOH use: none Restrictive diet: no Family history of neurologic disease: no  MEDICATIONS:  Outpatient Encounter Medications as of 01/09/2024  Medication Sig Note   aspirin  EC 81 MG tablet Take 1 tablet (81 mg total) by mouth daily.    atenolol  (TENORMIN ) 25 MG tablet Take 1 tablet (25 mg total) by mouth daily.    betamethasone dipropionate 0.05 % lotion Apply 1 application topically 2 (two) times daily as needed (scalp rash).    co-enzyme Q-10 30 MG capsule Take 30 mg by mouth daily.    indapamide  (LOZOL ) 2.5 MG tablet Take 2.5 mg by mouth every morning.    MAGNESIUM  PO Take 1 tablet by mouth at bedtime as needed (relax nervew). 11/16/2021: All supplements/vitamins are on hold until patient confers with MD regarding safety    Nutritional Supplements (ANTI-INFLAMMATORY ENZYME PO) Take by mouth 2 (two) times daily.    rosuvastatin  (CRESTOR ) 10 MG tablet Take 10 mg by mouth daily.    acetaminophen  (TYLENOL ) 500 MG tablet Take 1,000 mg by mouth every 6 (six) hours as needed for headache (pain).  Ascorbic Acid  (VITAMIN C) 1000 MG tablet Take 2,000 mg by mouth daily as needed (immune support). (Patient not taking: Reported on 10/31/2023) 11/16/2021: All supplements/vitamins are on hold until patient confers with MD regarding safety   Carboxymethylcellulose Sodium (THERATEARS OP) Place 1 drop into both eyes 2 (two) times daily as needed (dry eyes).    hydrocortisone  (ANUSOL -HC) 2.5 % rectal cream Place 1 application rectally 3 (three) times daily as needed for hemorrhoids. (Patient not taking: Reported on 11/28/2023)     ketorolac  (ACULAR ) 0.5 % ophthalmic solution Instill 1 drop TID in operative eye(s) starting 2 days prior to surgery and 3 weeks following surgery. (Patient not taking: Reported on 10/31/2023)    Multiple Vitamins-Minerals (ZINC  PO) Take 1 tablet by mouth at bedtime as needed (to relax nerves). (Patient not taking: Reported on 10/31/2023) 11/16/2021: All supplements/vitamins are on hold until patient confers with MD regarding safety    OVER THE COUNTER MEDICATION LIV PURE (Patient not taking: Reported on 10/31/2023)    Potassium Citrate  15 MEQ (1620 MG) TBCR Take 15 mEq by mouth in the morning and at bedtime. (Patient not taking: Reported on 10/31/2023)    PREBIOTIC PRODUCT PO Take by mouth. (Patient not taking: Reported on 10/31/2023)    prednisoLONE acetate (PRED FORTE) 1 % ophthalmic suspension Instill 1 drop TID in operative eye(s) starting 2 days prior to surgery and 3 weeks following surgery (Patient not taking: Reported on 10/31/2023)    terbinafine  (LAMISIL ) 250 MG tablet Take 1 tablet (250 mg total) by mouth daily. (Patient not taking: Reported on 01/09/2024)    tobramycin (TOBREX) 0.3 % ophthalmic solution Instill 1 drop TID in operative eye(s) starting 2 days prior to surgery and 3 weeks following surgery. (Patient not taking: Reported on 10/31/2023)    No facility-administered encounter medications on file as of 01/09/2024.    PAST MEDICAL HISTORY: Past Medical History:  Diagnosis Date   Anxiety    Blood in urine    Cervical disc disorder    bulging disc   Coronary artery disease    Diabetes mellitus without complication (HCC)    Dizziness    Falls    Fibromyalgia    H/O Salmonella gastroenteritis    High cholesterol    History of frequent urinary tract infections    History of hiatal hernia    History of vertigo    Hypertension    Imbalance    Kidney stones    Lichen planopilaris    Lumbar herniated disc    L5-L6   Numbness and tingling    hands and feet bilat    Oscillopsia     Peripheral neuropathy    Septicemia (HCC)    2013   Sinusitis    Sjogren's disease (HCC)    Tinnitus    Wears glasses     PAST SURGICAL HISTORY: Past Surgical History:  Procedure Laterality Date   ABDOMINAL HYSTERECTOMY     2014   CARDIAC CATHETERIZATION     CORONARY ARTERY BYPASS GRAFT N/A 08/05/2021   Procedure: CORONARY ARTERY BYPASS GRAFTING (CABG) TIMES THREE, USING LEFT INTERNAL MAMMARTY ARTERY AND ENDOSCOPICALLY HARVESTED RIGHT GREATER SAPHENOUS VEIN;  Surgeon: Lucas Dorise POUR, MD;  Location: [facility] OR;  Service: Open Heart Surgery;  Laterality: N/A;   ENDOVEIN HARVEST OF GREATER SAPHENOUS VEIN Right 08/05/2021   Procedure: ENDOVEIN HARVEST OF GREATER SAPHENOUS VEIN;  Surgeon: Lucas Dorise POUR, MD;  Location: [facility] OR;  Service: Open Heart Surgery;  Laterality: Right;   IR URETERAL  STENT RIGHT NEW ACCESS W/O SEP NEPHROSTOMY CATH  07/15/2021   KNEE ARTHROSCOPY WITH MEDIAL MENISECTOMY Left 10/29/2019   Procedure: KNEE ARTHROSCOPY WITH MEDIAL AND LATERAL MENISECTOMY WITH DEBRIDEMENT;  Surgeon: Duwayne Purchase, MD;  Location: [facility] ORS;  Service: Orthopedics;  Laterality: Left;  60 mins   LEFT HEART CATH AND CORONARY ANGIOGRAPHY N/A 08/02/2021   Procedure: LEFT HEART CATH AND CORONARY ANGIOGRAPHY;  Surgeon: Dann Candyce RAMAN, MD;  Location: [facility] INVASIVE CV LAB;  Service: Cardiovascular;  Laterality: N/A;   NASAL SINUS SURGERY     NEPHROLITHOTOMY Right 05/31/2015   Procedure: RIGHT PERCUTANEOUS NEPHROLITHOTOMY  ;  Surgeon: Mark Ottelin, MD;  Location: [facility] ORS;  Service: Urology;  Laterality: Right;   NEPHROLITHOTOMY Right 07/15/2021   Procedure: NEPHROLITHOTOMY PERCUTANEOUS;  Surgeon: Selma Donnice SAUNDERS, MD;  Location: [facility] ORS;  Service: Urology;  Laterality: Right;   prolapsed bladder and rectocele surgery      TEE WITHOUT CARDIOVERSION N/A 08/05/2021   Procedure: TRANSESOPHAGEAL ECHOCARDIOGRAM (TEE);  Surgeon: Lucas Dorise POUR, MD;  Location: [facility] OR;  Service: Open Heart Surgery;  Laterality:  N/A;   TUBAL LIGATION     1983   URETERAL STENT PLACEMENT     times 2 secondary to kidney stone     ALLERGIES: Allergies  Allergen Reactions   Augmentin  [Amoxicillin -Pot Clavulanate] Nausea And Vomiting   Ciprofloxacin Hives   Sulfa Antibiotics Other (See Comments)    Trembling, paralyzing pain, red eyes, skin crawling   Latex Itching   Macrolides And Ketolides Diarrhea and Nausea And Vomiting   Tetracyclines & Related Other (See Comments) and Tinitus    Vertigo    FAMILY HISTORY: Family History  Problem Relation Age of Onset   CAD Mother    Diabetes Father    Glaucoma Maternal Uncle    Breast cancer Cousin    Non-Hodgkin's lymphoma Brother    Non-Hodgkin's lymphoma Brother    Non-Hodgkin's lymphoma Brother        half brother   Lupus Other    Hypertension Other    Diabetes Other     SOCIAL HISTORY: Social History   Tobacco Use   Smoking status: Never   Smokeless tobacco: Never  Vaping Use   Vaping status: Never Used  Substance Use Topics   Alcohol use: No   Drug use: No   Social History   Social History Narrative   Lives at home with daughter on a one story    Left handed   Caffeine: 1 cup coffee per day some mornings   Retired     Objective:  Vital Signs:  BP 132/70   Pulse (!) 55   Ht 5' 1 (1.549 m)   Wt 135 lb (61.2 kg)   SpO2 98%   BMI 25.51 kg/m   General: General appearance: Awake and alert. No distress. Cooperative with exam.  Skin: No obvious rash or jaundice. HEENT: Atraumatic. Anicteric. Lungs: Non-labored breathing on room air  Heart: Regular Extremities: No edema. Psych: Affect appropriate.  Neurological: Mental Status: Alert. Speech fluent. No pseudobulbar affect Cranial Nerves: CNII: No RAPD. Visual fields intact. CNIII, IV, VI: PERRL. No nystagmus. EOMI. CN V: Facial sensation intact bilaterally to fine touch. CN VII: Facial muscles symmetric and strong. No ptosis at rest. CN VIII: Hears finger rub well  bilaterally. CN IX: No hypophonia. CN X: Palate elevates symmetrically. CN XI: Full strength shoulder shrug bilaterally. CN XII: Tongue protrusion full and midline. No atrophy or fasciculations. No significant dysarthria Motor: Tone is  normal. Strength 5/5 in bilateral upper and lower extremities. Reflexes:  Right Left   Bicep 2+ 2+   Tricep 2+ 2+   BrRad 2+ 2+   Knee 2+ 2+   Ankle 0 0    Pathological Reflexes: Babinski: flexor response bilaterally Hoffman: absent bilaterally Troemner: absent bilaterally Sensation: Pinprick: Intact in all extremities, except mildly diminished in right lateral thigh Vibration: Diminished in right great toe, otherwise intact Proprioception: Intact in left great toe. Inconsistent in right great toe. Coordination: Intact finger-to- nose-finger in LUE, mild dysmetria in RUE. Heel to shin normal bilaterally. Romberg with sway. Gait: Able to rise from chair with arms crossed unassisted. Wide based, unsteady gait.   Labs and Imaging review: Internal labs: Lipid panel (08/17/23): tChol 139, LDL 61, TG 81  External labs: 09/11/23: HbA1c: 5.6 CMP significant for glucose 120  Imaging/Procedures: OCT retina (03/15/22): Right Eye  Quality was good. Scan locations included subfoveal. Central Foveal  Thickness: 432. Progression has been stable. Findings include abnormal  foveal contour, epiretinal membrane.   Left Eye  Quality was good. Scan locations included subfoveal. Central Foveal  Thickness: 243. Progression has been stable. Findings include normal  foveal contour.   Assessment/Plan:  Tiffany Odonnell is a 73 y.o. female who presents for evaluation of imbalance, numbness and tingling in feet and hands. She has a relevant medical history of DM, HLD, HTN, nephrolithiasis, NSTEMI, cardiac arrest, fibromyalgia, vit D deficiency. Her neurological examination is pertinent for diminished sensation in right lateral thigh and right foot compared to  left. Available diagnostic data is significant for most recent HbA1c of 5.6. The etiology of patient's symptoms is currently unclear. Her current symptoms are imbalance and numbness and tingling. Given her history of DM, certainly polyneuropathy is possible, but her exam is asymmetric, with diminished sensation on right and not left. She also has back pain, so perhaps radiculopathy is a better explanation of symptoms. The right lateral thigh numbness may be due to compression of the right lateral femoral cutaneous nerve at the hip (meralgia paresthetica).   Of note, she has a long history of vertigo and vision changes. She has not had vertigo recently and vision changes are very chronic without clear etiology to me and likely unrelated to imbalance and numbness/tingling symptoms.  PLAN: -Blood work: B1, B12, IFE -EMG: RLE then maybe LLE vs RUE pending what is seen in RLE -Discussed Cymbalta  as this can treat neuropathic pain and fibromyalgia, patient will think about it  -Return to clinic to be determined  The impression above as well as the plan as outlined below were extensively discussed with the patient who voiced understanding. All questions were answered to their satisfaction.  The patient was counseled on pertinent fall precautions per the printed material provided today, and as noted under the Patient Instructions section below.  When available, results of the above investigations and possible further recommendations will be communicated to the patient via telephone/MyChart. Patient to call office if not contacted after expected testing turnaround time.   Total time spent reviewing records, interview, history/exam, documentation, and coordination of care on day of encounter:  65 min   Thank you for allowing me to participate in patient's care.  If I can answer any additional questions, I would be pleased to do so.  Venetia Potters, MD   CC: Elizbeth Leita Ruth, FNP [address] [place] [address]  CC: Referring provider: Elizbeth Leita Ruth, FNP [address] [place],  [place] [address]

## 2024-01-09 ENCOUNTER — Other Ambulatory Visit

## 2024-01-09 ENCOUNTER — Encounter: Payer: Self-pay | Admitting: Neurology

## 2024-01-09 ENCOUNTER — Ambulatory Visit (INDEPENDENT_AMBULATORY_CARE_PROVIDER_SITE_OTHER): Admitting: Neurology

## 2024-01-09 VITALS — BP 132/70 | HR 55 | Ht 61.0 in | Wt 135.0 lb

## 2024-01-09 DIAGNOSIS — H539 Unspecified visual disturbance: Secondary | ICD-10-CM | POA: Diagnosis not present

## 2024-01-09 DIAGNOSIS — R200 Anesthesia of skin: Secondary | ICD-10-CM

## 2024-01-09 DIAGNOSIS — M545 Low back pain, unspecified: Secondary | ICD-10-CM

## 2024-01-09 DIAGNOSIS — R202 Paresthesia of skin: Secondary | ICD-10-CM

## 2024-01-09 DIAGNOSIS — R2689 Other abnormalities of gait and mobility: Secondary | ICD-10-CM

## 2024-01-09 DIAGNOSIS — G8929 Other chronic pain: Secondary | ICD-10-CM

## 2024-01-09 NOTE — Patient Instructions (Addendum)
 I saw you today for imbalance. I am unsure what is causing your symptoms. It could be a pinched nerve in your back or nerve damage in your feet (neuropathy).  I would like to investigate further with the following: -Blood work today -Nerve testing called EMG (see more information below)  When I have your results, we will discuss next steps.  We also discussed a medication for nerve pain that also treats fibromyalgia called Cymbalta . You want to think about it for now.   The physicians and staff at [facility] are committed to providing excellent care. You may receive a survey requesting feedback about your experience at our office. We strive to receive very good responses to the survey questions. If you feel that your experience would prevent you from giving the office a very good  response, please contact our office to try to remedy the situation. We may be reached at [phone]. Thank you for taking the time out of your busy day to complete the survey.  Tiffany Potters, MD [facility]  ELECTROMYOGRAM AND NERVE CONDUCTION STUDIES (EMG/NCS) INSTRUCTIONS  How to Prepare The neurologist conducting the EMG will need to know if you have certain medical conditions. Tell the neurologist and other EMG lab personnel if you: Have a pacemaker or any other electrical medical device Take blood-thinning medications Have hemophilia, a blood-clotting disorder that causes prolonged bleeding Bathing Take a shower or bath shortly before your exam in order to remove oils from your skin. Don't apply lotions or creams before the exam.  What to Expect You'll likely be asked to change into a hospital gown for the procedure and lie down on an examination table. The following explanations can help you understand what will happen during the exam.  Electrodes. The neurologist or a technician places surface electrodes at various locations on your skin depending on where you're experiencing symptoms. Or  the neurologist may insert needle electrodes at different sites depending on your symptoms.  Sensations. The electrodes will at times transmit a tiny electrical current that you may feel as a twinge or spasm. The needle electrode may cause discomfort or pain that usually ends shortly after the needle is removed. If you are concerned about discomfort or pain, you may want to talk to the neurologist about taking a short break during the exam.  Instructions. During the needle EMG, the neurologist will assess whether there is any spontaneous electrical activity when the muscle is at rest - activity that isn't present in healthy muscle tissue - and the degree of activity when you slightly contract the muscle.  He or she will give you instructions on resting and contracting a muscle at appropriate times. Depending on what muscles and nerves the neurologist is examining, he or she may ask you to change positions during the exam.  After your EMG You may experience some temporary, minor bruising where the needle electrode was inserted into your muscle. This bruising should fade within several days. If it persists, contact your primary care doctor.    Preventing Falls at [facility] are common, often dreaded events in the lives of older people. Aside from the obvious injuries and even death that may result, fall can cause wide-ranging consequences including loss of independence, mental decline, decreased activity and mobility. Younger people are also at risk of falling, especially those with chronic illnesses and fatigue.  Ways to reduce risk for falling Examine diet and medications. Warm foods and alcohol dilate blood vessels, which can lead to  dizziness when standing. Sleep aids, antidepressants and pain medications can also increase the likelihood of a fall.  Get a vision exam. Poor vision, cataracts and glaucoma increase the chances of falling.  Check foot gear. Shoes should fit snugly and have a sturdy,  nonskid sole and a broad, low heel  Participate in a physician-approved exercise program to build and maintain muscle strength and improve balance and coordination. Programs that use ankle weights or stretch bands are excellent for muscle-strengthening. Water aerobics programs and low-impact Tai Chi programs have also been shown to improve balance and coordination.  Increase vitamin D intake. Vitamin D improves muscle strength and increases the amount of calcium  the body is able to absorb and deposit in bones.  How to prevent falls from common hazards Floors - Remove all loose wires, cords, and throw rugs. Minimize clutter. Make sure rugs are anchored and smooth. Keep furniture in its usual place.  Chairs -- Use chairs with straight backs, armrests and firm seats. Add firm cushions to existing pieces to add height.  Bathroom - Install grab bars and non-skid tape in the tub or shower. Use a bathtub transfer bench or a shower chair with a back support Use an elevated toilet seat and/or safety rails to assist standing from a low surface. Do not use towel racks or bathroom tissue holders to help you stand.  Lighting - Make sure halls, stairways, and entrances are well-lit. Install a night light in your bathroom or hallway. Make sure there is a light switch at the top and bottom of the staircase. Turn lights on if you get up in the middle of the night. Make sure lamps or light switches are within reach of the bed if you have to get up during the night.  Kitchen - Install non-skid rubber mats near the sink and stove. Clean spills immediately. Store frequently used utensils, pots, pans between waist and eye level. This helps prevent reaching and bending. Sit when getting things out of lower cupboards.  Living room/ Bedrooms - Place furniture with wide spaces in between, giving enough room to move around. Establish a route through the living room that gives you something to hold onto as you walk.  Stairs  - Make sure treads, rails, and rugs are secure. Install a rail on both sides of the stairs. If stairs are a threat, it might be helpful to arrange most of your activities on the lower level to reduce the number of times you must climb the stairs.  Entrances and doorways - Install metal handles on the walls adjacent to the doorknobs of all doors to make it more secure as you travel through the doorway.  Tips for maintaining balance Keep at least one hand free at all times. Try using a backpack or fanny pack to hold things rather than carrying them in your hands. Never carry objects in both hands when walking as this interferes with keeping your balance.  Attempt to swing both arms from front to back while walking. This might require a conscious effort if Parkinson's disease has diminished your movement. It will, however, help you to maintain balance and posture, and reduce fatigue.  Consciously lift your feet off of the ground when walking. Shuffling and dragging of the feet is a common culprit in losing your balance.  When trying to navigate turns, use a U technique of facing forward and making a wide turn, rather than pivoting sharply.  Try to stand with your feet shoulder-length apart. When your  feet are close together for any length of time, you increase your risk of losing your balance and falling.  Do one thing at a time. Don't try to walk and accomplish another task, such as reading or looking around. The decrease in your automatic reflexes complicates motor function, so the less distraction, the better.  Do not wear rubber or gripping soled shoes, they might catch on the floor and cause tripping.  Move slowly when changing positions. Use deliberate, concentrated movements and, if needed, use a grab bar or walking aid. Count 15 seconds between each movement. For example, when rising from a seated position, wait 15 seconds after standing to begin walking.  If balance is a continuous  problem, you might want to consider a walking aid such as a cane, walking stick, or walker. Once you've mastered walking with help, you might be ready to try it on your own again.

## 2024-01-14 LAB — IMMUNOFIXATION ELECTROPHORESIS
IgM, Serum: 1695 mg/dL — ABNORMAL LOW (ref 600–300)
IgM, Serum: 36 mg/dL — ABNORMAL LOW (ref 50–300)
Immunoglobulin A: 1695 mg/dL — ABNORMAL HIGH (ref 70–320)
Immunoglobulin A: 514 mg/dL — AB (ref 70–320)

## 2024-01-14 LAB — VITAMIN B1: Vitamin B1 (Thiamine): 12 nmol/L (ref 8–30)

## 2024-01-28 ENCOUNTER — Telehealth: Payer: Self-pay | Admitting: Internal Medicine

## 2024-01-28 NOTE — Telephone Encounter (Signed)
 STAT if HR is under 50 or over 120  (normal HR is 60-100 beats per minute)  What is your heart rate? 65   Do you have a log of your heart rate readings (document readings)?      Yesterday1:30pm: 126/75 hr 52                4:00pm       122/73 hr 53                6:00pm      125/72 hr 55  Today - 7:30am: 134/71 hr 57                8:00am: 118/72 hr 59                             106/59 hr 65    Do you have any other symptoms?  Hot in her head , drinks about 2 liters of water a day, feels lightheaded at times. No symptoms currently, she states they come and go.

## 2024-01-28 NOTE — Telephone Encounter (Signed)
 Spoke with pt who reports BP and HR's as below.  Pt states she has had some lightheadedness with the lower heart rates.  The lower HR has been ongoing now for a few weeks.  She has tried stopping the Atenolol  for a couple of days but  BP elevated.  She denies current CP or SOB.  She feels she does hydrate well and is taking medications as prescribed.. Pt advised will forward to Dr Mona for review and further recommendation.  Pt verbalizes understanding and agrees with current plan.

## 2024-01-28 NOTE — Telephone Encounter (Signed)
 Spoke with pt and advised per Dr Mona patient should decrease Atenolol  25mg  to 1/2 tablet (12.5mg ) by mouth daily and continue to log BP and HR.

## 2024-02-18 ENCOUNTER — Ambulatory Visit (INDEPENDENT_AMBULATORY_CARE_PROVIDER_SITE_OTHER): Admitting: Neurology

## 2024-02-18 ENCOUNTER — Other Ambulatory Visit

## 2024-02-18 ENCOUNTER — Telehealth: Payer: Self-pay | Admitting: Neurology

## 2024-02-18 DIAGNOSIS — R2689 Other abnormalities of gait and mobility: Secondary | ICD-10-CM

## 2024-02-18 DIAGNOSIS — H539 Unspecified visual disturbance: Secondary | ICD-10-CM

## 2024-02-18 DIAGNOSIS — G8929 Other chronic pain: Secondary | ICD-10-CM

## 2024-02-18 DIAGNOSIS — G629 Polyneuropathy, unspecified: Secondary | ICD-10-CM

## 2024-02-18 LAB — VITAMIN B12: Vitamin B-12: 516 pg/mL (ref 200–1100)

## 2024-02-18 NOTE — Procedures (Signed)
 [facility]  [address], [address]  [place], [place] [address] Tel: [phone] Fax: [phone] Test Date:  02/18/2024  Patient: Tiffany Odonnell DOB: 1950/08/22 Physician: Venetia Potters, MD  Sex: Female Height: 5' 1 Ref Phys: Venetia Potters, MD  ID#: [id number]   Technician:    History: This is a 73 year old female with imbalance and numbness and tingling in her feet and hands.  NCV & EMG Findings: Extensive electrodiagnostic evaluation of the right lower limb with additional nerve conduction studies of the left lower limb and right upper limb shows: Bilateral sural, bilateral superficial peroneal/fibular, and right ulnar sensory responses are absent. Right median and radial sensory responses are within normal limits. Bilateral peroneal/fibular (EDB) motor responses show reduced amplitude (L2.4, R2.0 mV). Right tibial (AH), peroneal/fibular (TA), median (APB), and ulnar (ADM) motor responses are within normal limits. Right H reflex response is absent. Chronic motor axon loss changes without accompanying active denervation changes are seen in the right tibialis anterior and medial head of gastrocnemius muscles. Patient declined further needle testing of right upper limb or comparison testing in left lower limb.  Impression: This is an abnormal study. 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 No electrodiagnostic evidence of a right lumbosacral (L3-S1) motor radiculopathy.  No electrodiagnostic evidence of a right median mononeuropathy at or distal to the wrist (ie: carpal tunnel syndrome).    ___________________________ Venetia Potters, MD    Nerve Conduction Studies Motor Nerve Results    Latency Amplitude F-Lat Segment Distance CV Comment  Site (ms) Norm (mV) Norm (ms)  (cm) (m/s) Norm   Left Fibular (EDB) Motor  Ankle 4.0  < 6.0 *2.4  > 2.5         Right Fibular (EDB) Motor  Ankle 3.9  < 6.0 *2.0  > 2.5        Bel fib head 10.2 - 1.83 -  Bel fib head-Ankle 29 46  > 40   Pop fossa 11.8 - 1.72 -  Pop fossa-Bel fib head 8 50 -   Right Fibular (TA) Motor  Fib head 1.90  < 4.5 3.3  > 3.0        Pop fossa 3.5  < 6.7 3.3 -  Pop fossa-Fib head 8 50  > 40   Right Median (APB) Motor  Wrist 2.9  < 4.0 6.7  > 5.0        Elbow 7.3 - 5.2 -  Elbow-Wrist 25 57  > 50   Right Tibial (AH) Motor  Ankle 3.4  < 6.0 8.1  > 4.0        Knee 11.1 - 6.7 -  Knee-Ankle 37 48  > 40   Right Ulnar (ADM) Motor  Wrist 1.95  < 3.1 7.2  > 7.0        Bel elbow 5.0 - 7.0 -  Bel elbow-Wrist 20 65  > 50   Ab elbow 6.8 - 6.3 -  Ab elbow-Bel elbow 10 56 -    Sensory Sites    Neg Peak Lat Amplitude (O-P) Segment Distance Velocity Comment  Site (ms) Norm (V) Norm  (cm) (ms)   Right Median Sensory  Wrist-Dig II 3.3  < 3.8 13  > 10 Wrist-Dig II 13    Right Radial Sensory  Forearm-Wrist 2.1  < 2.8 13  > 10 Forearm-Wrist 10    Left Superficial  Fibular Sensory  14 cm-Ankle *NR  < 4.6 *NR  > 3 14 cm-Ankle 14    Right Superficial Fibular Sensory  14 cm-Ankle *NR  < 4.6 *NR  > 3 14 cm-Ankle 14    Left Sural Sensory  Calf-Lat mall *NR  < 4.6 *NR  > 3 Calf-Lat mall 14    Right Sural Sensory  Calf-Lat mall *NR  < 4.6 *NR  > 3 Calf-Lat mall 14    Right Ulnar Sensory  Wrist-Dig V *NR  < 3.2 *NR  > 5 Wrist-Dig V 11     H-Reflex Results    M-Lat H Lat H Neg Amp H-M Lat  Site (ms) (ms) Norm (mV) (ms)  Right Tibial H-Reflex  Pop fossa 4.6 ---  < 35.0 --- ---   Electromyography   Side Muscle Ins.Act Fibs Fasc Recrt Amp Dur Poly Activation Comment  Right Tib ant Nml Nml Nml *1- *1+ *1+ *1+ Nml N/A  Right Gastroc MH Nml Nml Nml *1- *1+ *1+ *1+ Nml N/A  Right Rectus fem Nml Nml Nml Nml Nml Nml Nml Nml N/A  Right Biceps fem SH Nml Nml Nml Nml Nml Nml Nml Nml N/A  Right Gluteus med Nml Nml Nml Nml Nml Nml Nml Nml N/A      Waveforms:  Motor                Sensory                  H-Reflex

## 2024-02-18 NOTE — Telephone Encounter (Signed)
 Discussed the results of patient's EMG after the procedure today. It showed evidence of a large fiber sensorimotor polyneuropathy. Her only current known risk facto is DM. Her B1 and IFE were normal, but B12 was not drawn at last visit. She will get this today after EMG. She will follow up in about 6 months. She mentions that overall her symptoms are a little improved since her last clinic visit. All questions were answered.  Venetia Potters, MD [facility] Neurology

## 2024-02-19 ENCOUNTER — Ambulatory Visit: Payer: Self-pay | Admitting: Neurology

## 2024-04-28 ENCOUNTER — Telehealth: Payer: Self-pay | Admitting: Internal Medicine

## 2024-04-28 ENCOUNTER — Other Ambulatory Visit: Payer: Self-pay | Admitting: Urology

## 2024-04-28 NOTE — Telephone Encounter (Signed)
   Name: Tiffany Odonnell  DOB: [birth year]  MRN: [id number]  Primary Cardiologist: Vinie JAYSON Maxcy, MD   Preoperative team, please contact this patient and set up a phone call appointment for further preoperative risk assessment. Please obtain consent and complete medication review. Thank you for your help.  I confirm that guidance regarding antiplatelet and oral anticoagulation therapy has been completed and, if necessary, noted below.  Per office protocol, if patient is without any new symptoms or concerns at the time of their virtual visit, she may hold aspirin  for 5 days prior to procedure. Please resume aspirin  as soon as possible postprocedure, at the discretion of the surgeon.    I also confirmed the patient resides in the state of [place] . As per [facility] Medical Board telemedicine laws, the patient must reside in the state in which the provider is licensed.   Lum LITTIE Louis, NP 04/28/2024, 11:35 AM [facility]

## 2024-04-28 NOTE — Telephone Encounter (Signed)
 Left message to call back to schedule tele pre op appt.

## 2024-04-28 NOTE — Telephone Encounter (Signed)
   Pre-operative Risk Assessment    Patient Name: Tiffany Odonnell  DOB: [birth year] MRN: [id number]      Request for Surgical Clearance     Procedure:  Cystoscopy, Right Ureteroscopy, Laser Lithotripsy , stent  Date of Surgery:  Clearance 05/09/24                                 Surgeon:  Dr. Selma Socks Group or Practice Name:  [facility] Phone number:  [phone] Fax number:  [phone]   Type of Clearance Requested:   - Medical  - Pharmacy:  Hold Aspirin  5 Days before   Type of Anesthesia:  General    Additional requests/questions:    SignedRojelio Kays   04/28/2024, 11:17 AM

## 2024-04-29 ENCOUNTER — Telehealth (HOSPITAL_BASED_OUTPATIENT_CLINIC_OR_DEPARTMENT_OTHER): Payer: Self-pay | Admitting: *Deleted

## 2024-04-29 ENCOUNTER — Other Ambulatory Visit: Payer: Self-pay | Admitting: Urology

## 2024-04-29 NOTE — Telephone Encounter (Signed)
 Pt has been scheduled tele preop appt 05/01/24, due to med hold and procedure date.   Med rec and consent are done.

## 2024-04-29 NOTE — Telephone Encounter (Signed)
 Pt has been scheduled tele preop appt 05/01/24, due to med hold and procedure date.   Med rec and consent are done.     Patient Consent for Virtual Visit        [name] has provided verbal consent on 04/29/2024 for a virtual visit (video or telephone).   CONSENT FOR VIRTUAL VISIT FOR:  Tiffany Odonnell  By participating in this virtual visit I agree to the following:  I hereby voluntarily request, consent and authorize [facility] and its employed or contracted physicians, physician assistants, nurse practitioners or other licensed health care professionals (the Practitioner), to provide me with telemedicine health care services (the "Services) as deemed necessary by the treating Practitioner. I acknowledge and consent to receive the Services by the Practitioner via telemedicine. I understand that the telemedicine visit will involve communicating with the Practitioner through live audiovisual communication technology and the disclosure of certain medical information by electronic transmission. I acknowledge that I have been given the opportunity to request an in-person assessment or other available alternative prior to the telemedicine visit and am voluntarily participating in the telemedicine visit.  I understand that I have the right to withhold or withdraw my consent to the use of telemedicine in the course of my care at any time, without affecting my right to future care or treatment, and that the Practitioner or I may terminate the telemedicine visit at any time. I understand that I have the right to inspect all information obtained and/or recorded in the course of the telemedicine visit and may receive copies of available information for a reasonable fee.  I understand that some of the potential risks of receiving the Services via telemedicine include:  Delay or interruption in medical evaluation due to technological equipment failure or disruption; Information  transmitted may not be sufficient (e.g. poor resolution of images) to allow for appropriate medical decision making by the Practitioner; and/or  In rare instances, security protocols could fail, causing a breach of personal health information.  Furthermore, I acknowledge that it is my responsibility to provide information about my medical history, conditions and care that is complete and accurate to the best of my ability. I acknowledge that Practitioner's advice, recommendations, and/or decision may be based on factors not within their control, such as incomplete or inaccurate data provided by me or distortions of diagnostic images or specimens that may result from electronic transmissions. I understand that the practice of medicine is not an exact science and that Practitioner makes no warranties or guarantees regarding treatment outcomes. I acknowledge that a copy of this consent can be made available to me via my patient portal [facility] MyChart), or I can request a printed copy by calling the office of [facility].    I understand that my insurance will be billed for this visit.   I have read or had this consent read to me. I understand the contents of this consent, which adequately explains the benefits and risks of the Services being provided via telemedicine.  I have been provided ample opportunity to ask questions regarding this consent and the Services and have had my questions answered to my satisfaction. I give my informed consent for the services to be provided through the use of telemedicine in my medical care

## 2024-04-30 ENCOUNTER — Other Ambulatory Visit: Payer: Self-pay | Admitting: Urology

## 2024-05-01 ENCOUNTER — Encounter: Payer: Self-pay | Admitting: Nurse Practitioner

## 2024-05-01 ENCOUNTER — Ambulatory Visit: Attending: Student in an Organized Health Care Education/Training Program | Admitting: Nurse Practitioner

## 2024-05-01 DIAGNOSIS — Z01810 Encounter for preprocedural cardiovascular examination: Secondary | ICD-10-CM | POA: Insufficient documentation

## 2024-05-01 NOTE — Progress Notes (Signed)
 Virtual Visit via Telephone Note   Because of Tiffany Odonnell co-morbid illnesses, she is at least at moderate risk for complications without adequate follow up.  This format is felt to be most appropriate for this patient at this time.  Due to technical limitations with video connection web designer), today's appointment will be conducted as an audio only telehealth visit, and [employer] verbally agreed to proceed in this manner.   All issues noted in this document were discussed and addressed.  No physical exam could be performed with this format.  Evaluation Performed:  Preoperative cardiovascular risk assessment _____________   Date:  05/01/2024   Patient ID:  Tiffany Odonnell, DOB 1950-12-02, MRN [id number] Patient Location:  Home Provider location:   Office  Primary Care Provider:  Elizbeth Leita Ruth, FNP (Inactive) Primary Cardiologist:  Vinie JAYSON Maxcy, MD  Chief Complaint / Patient Profile   73 y.o. y/o female with a h/o CAD, CABG x 3, HTN, HLD, type 2 diabetes, neuropathy,  Sjogren's, who is pending cystoscopy, right ureteroscopy, later lithotripsy, stent on 11/7 with Dr. Selma  and presents today for telephonic preoperative cardiovascular risk assessment.  History of Present Illness    Tiffany Odonnell is a 73 y.o. female who presents via audio/video conferencing for a telehealth visit today.  Pt was last seen in cardiology clinic on 10/31/23 by Dr. Maxcy.  At that time Tiffany Odonnell was doing well.  The patient is now pending procedure as outlined above. Since her last visit, she  denies chest pain, shortness of breath, lower extremity edema, fatigue, palpitations, melena, hematuria, hemoptysis, diaphoresis, weakness, presyncope, syncope, orthopnea, and PND. Reports she has significantly changed her diet and BP is consistently < 120/81 mmHg without  taking atenolol , so she has discontinued it. She is able to achieve > 4 METS activity without concerning  cardiac symptoms.    Past Medical History    Past Medical History:  Diagnosis Date   Anxiety    Blood in urine    Cervical disc disorder    bulging disc   Coronary artery disease    Diabetes mellitus without complication (HCC)    Dizziness    Falls    Fibromyalgia    H/O Salmonella gastroenteritis    High cholesterol    History of frequent urinary tract infections    History of hiatal hernia    History of vertigo    Hypertension    Imbalance    Kidney stones    Lichen planopilaris    Lumbar herniated disc    L5-L6   Numbness and tingling    hands and feet bilat    Oscillopsia    Peripheral neuropathy    Septicemia (HCC)    2013   Sinusitis    Sjogren's disease    Tinnitus    Wears glasses    Past Surgical History:  Procedure Laterality Date   ABDOMINAL HYSTERECTOMY     2014   CARDIAC CATHETERIZATION     CORONARY ARTERY BYPASS GRAFT N/A 08/05/2021   Procedure: CORONARY ARTERY BYPASS GRAFTING (CABG) TIMES THREE, USING LEFT INTERNAL MAMMARTY ARTERY AND ENDOSCOPICALLY HARVESTED RIGHT GREATER SAPHENOUS VEIN;  Surgeon: Lucas Dorise POUR, MD;  Location: [facility] OR;  Service: Open Heart Surgery;  Laterality: N/A;   ENDOVEIN HARVEST OF GREATER SAPHENOUS VEIN Right 08/05/2021   Procedure: ENDOVEIN HARVEST OF GREATER SAPHENOUS VEIN;  Surgeon: Lucas Dorise POUR, MD;  Location: [facility] OR;  Service: Open Heart Surgery;  Laterality: Right;  IR URETERAL STENT RIGHT NEW ACCESS W/O SEP NEPHROSTOMY CATH  07/15/2021   KNEE ARTHROSCOPY WITH MEDIAL MENISECTOMY Left 10/29/2019   Procedure: KNEE ARTHROSCOPY WITH MEDIAL AND LATERAL MENISECTOMY WITH DEBRIDEMENT;  Surgeon: Duwayne Purchase, MD;  Location: [facility] ORS;  Service: Orthopedics;  Laterality: Left;  60 mins   LEFT HEART CATH AND CORONARY ANGIOGRAPHY N/A 08/02/2021   Procedure: LEFT HEART CATH AND CORONARY ANGIOGRAPHY;  Surgeon: Dann Candyce RAMAN, MD;  Location: [facility] INVASIVE CV LAB;  Service: Cardiovascular;  Laterality: N/A;   NASAL SINUS SURGERY      NEPHROLITHOTOMY Right 05/31/2015   Procedure: RIGHT PERCUTANEOUS NEPHROLITHOTOMY  ;  Surgeon: Mark Ottelin, MD;  Location: [facility] ORS;  Service: Urology;  Laterality: Right;   NEPHROLITHOTOMY Right 07/15/2021   Procedure: NEPHROLITHOTOMY PERCUTANEOUS;  Surgeon: Selma Donnice SAUNDERS, MD;  Location: [facility] ORS;  Service: Urology;  Laterality: Right;   prolapsed bladder and rectocele surgery      TEE WITHOUT CARDIOVERSION N/A 08/05/2021   Procedure: TRANSESOPHAGEAL ECHOCARDIOGRAM (TEE);  Surgeon: Lucas Dorise POUR, MD;  Location: [facility];  Service: Open Heart Surgery;  Laterality: N/A;   TUBAL LIGATION     1983   URETERAL STENT PLACEMENT     times 2 secondary to kidney stone     Allergies  Allergies  Allergen Reactions   Augmentin  [Amoxicillin -Pot Clavulanate] Nausea And Vomiting   Ciprofloxacin Hives   Sulfa Antibiotics Other (See Comments)    Trembling, paralyzing pain, red eyes, skin crawling   Latex Itching   Macrolides And Ketolides Diarrhea and Nausea And Vomiting   Tetracyclines & Related Other (See Comments) and Tinitus    Vertigo    Home Medications    Prior to Admission medications   Medication Sig Start Date End Date Taking? Authorizing Provider  aspirin  EC 81 MG tablet Take 1 tablet (81 mg total) by mouth daily. 10/29/19   Duwayne Purchase, MD  atenolol  (TENORMIN ) 25 MG tablet Take 1 tablet (25 mg total) by mouth daily. Patient taking differently: Take 25 mg by mouth daily. Pt states 04/29/24 she has decreased on her own accord to 1/2 tablet once a day; though does not take everyday as she states her BP has been good, said she will d.w PAC during her tele preop visit. 10/27/21 04/29/24  Walker, Caitlin S, NP  betamethasone dipropionate 0.05 % lotion Apply 1 application topically 2 (two) times daily as needed (scalp rash). 04/03/18   [provider]  co-enzyme Q-10 30 MG capsule Take 30 mg by mouth daily.    [provider]  indapamide  (LOZOL ) 2.5 MG tablet Take 2.5 mg by  mouth every morning.    [provider]  MAGNESIUM  PO Take 1 tablet by mouth at bedtime as needed (relax nervew).    [provider]  Nutritional Supplements (ANTI-INFLAMMATORY ENZYME PO) Take by mouth 2 (two) times daily.    [provider]  rosuvastatin  (CRESTOR ) 10 MG tablet Take 10 mg by mouth daily.    [provider]  terbinafine  (LAMISIL ) 250 MG tablet Take 1 tablet (250 mg total) by mouth daily. Patient not taking: Reported on 01/09/2024 11/28/23   Janit Thresa HERO, DPM    Physical Exam    Vital Signs:  Tiffany Odonnell does not have vital signs available for review today.  Given telephonic nature of communication, physical exam is limited. AAOx3. NAD. Normal affect.  Speech and respirations are unlabored.  Accessory Clinical Findings    None  Assessment & Plan  1.  Preoperative Cardiovascular Risk Assessment: According to the Revised Cardiac Risk Index (RCRI), her Perioperative Risk of Major Cardiac Event is (%): 0.9. Her Functional Capacity in METs is: 6.61 according to the Duke Activity Status Index (DASI). The patient is doing well from a cardiac perspective. Therefore, based on ACC/AHA guidelines, the patient would be at acceptable risk for the planned procedure without further cardiovascular testing.   The patient was advised that if she develops new symptoms prior to surgery to contact our office to arrange for a follow-up visit, and she verbalized understanding.  Per office protocol, he may hold aspirin  for 5-7 days prior to procedure and should resume as soon as hemodynamically stable postoperatively.   A copy of this note will be routed to requesting surgeon.  Time:   Today, I have spent 10 minutes with the patient with telehealth technology discussing medical history, symptoms, and management plan.     Rosaline EMERSON Bane, NP-C  05/01/2024, 2:05 PM [address], [address] [place], [place] [address] Office [phone]  Fax [phone]

## 2024-05-06 NOTE — Patient Instructions (Signed)
 SURGICAL WAITING ROOM VISITATION  Patients having surgery or a procedure may have no more than 2 support people in the waiting area - these visitors may rotate.    Children under the age of 30 must have an adult with them who is not the patient.  Visitors with respiratory illnesses are discouraged from visiting and should remain at home.  If the patient needs to stay at the hospital during part of their recovery, the visitor guidelines for inpatient rooms apply. Pre-op nurse will coordinate an appropriate time for 1 support person to accompany patient in pre-op.  This support person may not rotate.    Please refer to the [facility] website for the visitor guidelines for Inpatients (after your surgery is over and you are in a regular room).       Your procedure is scheduled on:  05/09/2024    Report to [facility] Main Entrance    Report to admitting at   100 pm    Call this number if you have problems the morning of surgery [phone]   Do not eat food :After Midnight.   After Midnight you may have the following liquids until _ 1200 noon _____  DAY OF SURGERY  Water Non-Citrus Juices (without pulp, NO RED-Apple, White grape, White cranberry) Black Coffee (NO MILK/CREAM OR CREAMERS, sugar ok)  Clear Tea (NO MILK/CREAM OR CREAMERS, sugar ok) regular and decaf                             Plain Jell-O (NO RED)                                           Fruit ices (not with fruit pulp, NO RED)                                     Popsicles (NO RED)                                                               Sports drinks like Gatorade (NO RED)                       Oral Hygiene is also important to reduce your risk of infection.                                    Remember - BRUSH YOUR TEETH THE MORNING OF SURGERY WITH YOUR REGULAR TOOTHPASTE  DENTURES WILL BE REMOVED PRIOR TO SURGERY PLEASE DO NOT APPLY Poly grip OR ADHESIVES!!!   Do NOT smoke after  Midnight   Stop all vitamins and herbal supplements 7 days before surgery.   Take these medicines the morning of surgery with A SIP OF WATER: none  DO NOT TAKE ANY ORAL DIABETIC MEDICATIONS DAY OF YOUR SURGERY  Bring CPAP mask and tubing day of surgery.  You may not have any metal on your body including hair pins, jewelry, and body piercing             Do not wear make-up, lotions, powders, perfumes/cologne, or deodorant  Do not wear nail polish including gel and S&S, artificial/acrylic nails, or any other type of covering on natural nails including finger and toenails. If you have artificial nails, gel coating, etc. that needs to be removed by a nail salon please have this removed prior to surgery or surgery may need to be canceled/ delayed if the surgeon/ anesthesia feels like they are unable to be safely monitored.   Do not shave  48 hours prior to surgery.               Men may shave face and neck.   Do not bring valuables to the hospital. [facility] IS NOT             RESPONSIBLE   FOR VALUABLES.   Contacts, glasses, dentures or bridgework may not be worn into surgery.   Bring small overnight bag day of surgery.   DO NOT BRING YOUR HOME MEDICATIONS TO THE HOSPITAL. PHARMACY WILL DISPENSE MEDICATIONS LISTED ON YOUR MEDICATION LIST TO YOU DURING YOUR ADMISSION IN THE HOSPITAL!    Patients discharged on the day of surgery will not be allowed to drive home.  Someone NEEDS to stay with you for the first 24 hours after anesthesia.   Special Instructions: Bring a copy of your healthcare power of attorney and living will documents the day of surgery if you haven't scanned them before.              Please read over the following fact sheets you were given: IF YOU HAVE QUESTIONS ABOUT YOUR PRE-OP INSTRUCTIONS PLEASE CALL [phone].   If you received a COVID test during your pre-op visit  it is requested that you wear a mask when out in public, stay away  from anyone that may not be feeling well and notify your surgeon if you develop symptoms. If you test positive for Covid or have been in contact with anyone that has tested positive in the last 10 days please notify you surgeon.    [facility] [place] - Preparing for Surgery Before surgery, you can play an important role.  Because skin is not sterile, your skin needs to be as free of germs as possible.  You can reduce the number of germs on your skin by washing with CHG (chlorahexidine gluconate) soap before surgery.  CHG is an antiseptic cleaner which kills germs and bonds with the skin to continue killing germs even after washing. Please DO NOT use if you have an allergy to CHG or antibacterial soaps.  If your skin becomes reddened/irritated stop using the CHG and inform your nurse when you arrive at Short Stay. Do not shave (including legs and underarms) for at least 48 hours prior to the first CHG shower.  You may shave your face/neck. Please follow these instructions carefully:  1.  Shower with CHG Soap the night before surgery and the  morning of Surgery.  2.  If you choose to wash your hair, wash your hair first as usual with your  normal  shampoo.  3.  After you shampoo, rinse your hair and body thoroughly to remove the  shampoo.  4.  Use CHG as you would any other liquid soap.  You can apply chg directly  to the skin and wash                       Gently with a scrungie or clean washcloth.  5.  Apply the CHG Soap to your body ONLY FROM THE NECK DOWN.   Do not use on face/ open                           Wound or open sores. Avoid contact with eyes, ears mouth and genitals (private parts).                       Wash face,  Genitals (private parts) with your normal soap.             6.  Wash thoroughly, paying special attention to the area where your surgery  will be performed.  7.  Thoroughly rinse your body with warm water from the neck down.  8.  DO NOT shower/wash with your  normal soap after using and rinsing off  the CHG Soap.                9.  Pat yourself dry with a clean towel.            10.  Wear clean pajamas.            11.  Place clean sheets on your bed the night of your first shower and do not  sleep with pets. Day of Surgery : Do not apply any lotions/deodorants the morning of surgery.  Please wear clean clothes to the hospital/surgery center.  FAILURE TO FOLLOW THESE INSTRUCTIONS MAY RESULT IN THE CANCELLATION OF YOUR SURGERY PATIENT SIGNATURE_________________________________  NURSE SIGNATURE__________________________________  ________________________________________________________________________

## 2024-05-08 ENCOUNTER — Other Ambulatory Visit: Payer: Self-pay

## 2024-05-08 ENCOUNTER — Encounter (HOSPITAL_COMMUNITY)
Admission: RE | Admit: 2024-05-08 | Discharge: 2024-05-08 | Disposition: A | Discharge status: Home / Self Care | Source: Ambulatory Visit | Attending: Urology | Admitting: Urology

## 2024-05-08 ENCOUNTER — Encounter (HOSPITAL_COMMUNITY): Payer: Self-pay

## 2024-05-08 VITALS — BP 140/80 | HR 70 | Temp 98.5°F | Resp 16 | Ht 60.0 in | Wt 132.0 lb

## 2024-05-08 DIAGNOSIS — Z951 Presence of aortocoronary bypass graft: Secondary | ICD-10-CM | POA: Diagnosis not present

## 2024-05-08 DIAGNOSIS — Z01812 Encounter for preprocedural laboratory examination: Secondary | ICD-10-CM | POA: Insufficient documentation

## 2024-05-08 DIAGNOSIS — Z01818 Encounter for other preprocedural examination: Secondary | ICD-10-CM | POA: Diagnosis present

## 2024-05-08 DIAGNOSIS — M3500 Sicca syndrome, unspecified: Secondary | ICD-10-CM | POA: Insufficient documentation

## 2024-05-08 DIAGNOSIS — I10 Essential (primary) hypertension: Secondary | ICD-10-CM | POA: Insufficient documentation

## 2024-05-08 DIAGNOSIS — I2510 Atherosclerotic heart disease of native coronary artery without angina pectoris: Secondary | ICD-10-CM | POA: Diagnosis not present

## 2024-05-08 DIAGNOSIS — E139 Other specified diabetes mellitus without complications: Secondary | ICD-10-CM | POA: Insufficient documentation

## 2024-05-08 DIAGNOSIS — N201 Calculus of ureter: Secondary | ICD-10-CM | POA: Insufficient documentation

## 2024-05-08 HISTORY — DX: Acute myocardial infarction, unspecified: I21.9

## 2024-05-08 HISTORY — DX: Other complications of anesthesia, initial encounter: T88.59XA

## 2024-05-08 HISTORY — DX: Nausea with vomiting, unspecified: R11.2

## 2024-05-08 HISTORY — DX: Personal history of urinary calculi: Z87.442

## 2024-05-08 LAB — BASIC METABOLIC PANEL WITH GFR
Anion gap: 10 (ref 5–15)
BUN: 17 mg/dL (ref 8–23)
CO2: 27 mmol/L (ref 22–32)
Calcium: 9.6 mg/dL (ref 8.9–10.3)
Chloride: 99 mmol/L (ref 98–111)
Creatinine, Ser: 0.8 mg/dL (ref 0.44–1.00)
GFR, Estimated: >60 mL/min (ref >60)
Glucose, Bld: 138 mg/dL — ABNORMAL HIGH (ref 70–99)
Potassium: 4.4 mmol/L (ref 3.5–5.1)
Sodium: 136 mmol/L (ref 135–145)

## 2024-05-08 LAB — HEMOGLOBIN A1C
Hgb A1c MFr Bld: 5.3 % (ref 4.8–5.6)
Mean Plasma Glucose: 105.41 mg/dL

## 2024-05-08 LAB — CBC
HCT: 41.6 % (ref 36.0–46.0)
Hemoglobin: 13.5 g/dL (ref 12.0–15.0)
MCH: 30.7 pg (ref 26.0–34.0)
MCHC: 32.5 g/dL (ref 30.0–36.0)
MCV: 94.5 fL (ref 80.0–100.0)
Platelets: 239 K/uL (ref 150–400)
RBC: 4.4 MIL/uL (ref 3.87–5.11)
RDW: 11.9 % (ref 11.5–15.5)
WBC: 7.9 K/uL (ref 4.0–10.5)
nRBC: 0 % (ref 0.0–0.2)

## 2024-05-08 LAB — GLUCOSE, CAPILLARY: Glucose-Capillary: 146 mg/dL — ABNORMAL HIGH (ref 70–99)

## 2024-05-08 NOTE — Progress Notes (Signed)
 Anesthesia Chart Review   Case: [id number] Date/Time: 05/09/24 1445   Procedure: CYSTOSCOPY/URETEROSCOPY/HOLMIUM LASER/STENT PLACEMENT (Right)   Anesthesia type: General   Diagnosis: Ureteral stone [N20.1]   Pre-op diagnosis: RIGHT URETERAL STONE   Location: [facility] PROC[facility] / [facility]   Surgeons: Selma Donnice SAUNDERS, MD       DISCUSSION:73 y.o. never smoker with h/o HTN, Sjogren's, CAD s/p CABG, DM II, right ureteral stone scheduled for above procedure 05/09/2024 with Dr. Donnice Selma.   Per cardiology preoperative evaluation 05/01/2024, According to the Revised Cardiac Risk Index (RCRI), her Perioperative Risk of Major Cardiac Event is (%): 0.9. Her Functional Capacity in METs is: 6.61 according to the Duke Activity Status Index (DASI). The patient is doing well from a cardiac perspective. Therefore, based on ACC/AHA guidelines, the patient would be at acceptable risk for the planned procedure without further cardiovascular testing.    The patient was advised that if she develops new symptoms prior to surgery to contact our office to arrange for a follow-up visit, and she verbalized understanding.   Per office protocol, he may hold aspirin  for 5-7 days prior to procedure and should resume as soon as hemodynamically stable postoperatively.  VS: There were no vitals taken for this visit.  PROVIDERS: Elizbeth Leita Ruth, FNP (Inactive)  Primary Cardiologist:  Vinie JAYSON Maxcy, MD  LABS: Labs reviewed: Acceptable for surgery. (all labs ordered are listed, but only abnormal results are displayed)  Labs Reviewed - No data to display   IMAGES:   EKG:   CV: Echo 08/02/2021 1. Left ventricular ejection fraction, by estimation, is 60 to 65%. The  left ventricle has normal function. The left ventricle has no regional  wall motion abnormalities. Left ventricular diastolic parameters are  consistent with Grade I diastolic  dysfunction (impaired relaxation).   2. Right ventricular systolic  function is normal. The right ventricular  size is normal. There is normal pulmonary artery systolic pressure.   3. The mitral valve is normal in structure. Mild mitral valve  regurgitation. No evidence of mitral stenosis.   4. The aortic valve is tricuspid. There is mild calcification of the  aortic valve. Aortic valve regurgitation is not visualized. Aortic valve  sclerosis is present, with no evidence of aortic valve stenosis.   5. The inferior vena cava is normal in size with greater than 50%  respiratory variability, suggesting right atrial pressure of 3 mmHg.  Past Medical History:  Diagnosis Date   Anxiety    Blood in urine    Cervical disc disorder    bulging disc   Coronary artery disease    Diabetes mellitus without complication (HCC)    Dizziness    Falls    Fibromyalgia    H/O Salmonella gastroenteritis    High cholesterol    History of frequent urinary tract infections    History of hiatal hernia    History of vertigo    Hypertension    Imbalance    Kidney stones    Lichen planopilaris    Lumbar herniated disc    L5-L6   Numbness and tingling    hands and feet bilat    Oscillopsia    Peripheral neuropathy    Septicemia (HCC)    2013   Sinusitis    Sjogren's disease    Tinnitus    Wears glasses     Past Surgical History:  Procedure Laterality Date   ABDOMINAL HYSTERECTOMY     2014   CARDIAC CATHETERIZATION  CORONARY ARTERY BYPASS GRAFT N/A 08/05/2021   Procedure: CORONARY ARTERY BYPASS GRAFTING (CABG) TIMES THREE, USING LEFT INTERNAL MAMMARTY ARTERY AND ENDOSCOPICALLY HARVESTED RIGHT GREATER SAPHENOUS VEIN;  Surgeon: Lucas Dorise POUR, MD;  Location: [facility] OR;  Service: Open Heart Surgery;  Laterality: N/A;   ENDOVEIN HARVEST OF GREATER SAPHENOUS VEIN Right 08/05/2021   Procedure: ENDOVEIN HARVEST OF GREATER SAPHENOUS VEIN;  Surgeon: Lucas Dorise POUR, MD;  Location: [facility] OR;  Service: Open Heart Surgery;  Laterality: Right;   IR URETERAL STENT RIGHT NEW  ACCESS W/O SEP NEPHROSTOMY CATH  07/15/2021   KNEE ARTHROSCOPY WITH MEDIAL MENISECTOMY Left 10/29/2019   Procedure: KNEE ARTHROSCOPY WITH MEDIAL AND LATERAL MENISECTOMY WITH DEBRIDEMENT;  Surgeon: Duwayne Purchase, MD;  Location: [facility];  Service: Orthopedics;  Laterality: Left;  60 mins   LEFT HEART CATH AND CORONARY ANGIOGRAPHY N/A 08/02/2021   Procedure: LEFT HEART CATH AND CORONARY ANGIOGRAPHY;  Surgeon: Dann Candyce RAMAN, MD;  Location: [facility] INVASIVE CV LAB;  Service: Cardiovascular;  Laterality: N/A;   NASAL SINUS SURGERY     NEPHROLITHOTOMY Right 05/31/2015   Procedure: RIGHT PERCUTANEOUS NEPHROLITHOTOMY  ;  Surgeon: Mark Ottelin, MD;  Location: [facility];  Service: Urology;  Laterality: Right;   NEPHROLITHOTOMY Right 07/15/2021   Procedure: NEPHROLITHOTOMY PERCUTANEOUS;  Surgeon: Selma Donnice SAUNDERS, MD;  Location: [facility];  Service: Urology;  Laterality: Right;   prolapsed bladder and rectocele surgery      TEE WITHOUT CARDIOVERSION N/A 08/05/2021   Procedure: TRANSESOPHAGEAL ECHOCARDIOGRAM (TEE);  Surgeon: Lucas Dorise POUR, MD;  Location: [facility] OR;  Service: Open Heart Surgery;  Laterality: N/A;   TUBAL LIGATION     1983   URETERAL STENT PLACEMENT     times 2 secondary to kidney stone     MEDICATIONS:  aspirin  EC 81 MG tablet   atenolol  (TENORMIN ) 25 MG tablet   betamethasone dipropionate 0.05 % lotion   co-enzyme Q-10 30 MG capsule   indapamide  (LOZOL ) 2.5 MG tablet   MAGNESIUM  PO   MILK THISTLE PO   Polyethyl Glycol-Propyl Glycol (SYSTANE OP)   POTASSIUM PO   Probiotic Product (PROBIOTIC BLEND PO)   rosuvastatin  (CRESTOR ) 10 MG tablet   No current facility-administered medications for this encounter.     Harlene Hoots Ward, PA-C [facility]Surgical [facility] [phone]

## 2024-05-08 NOTE — Anesthesia Preprocedure Evaluation (Signed)
 Anesthesia Evaluation  Patient identified by MRN, date of birth, ID band Patient awake    Reviewed: Allergy & Precautions, NPO status , Patient's Chart, lab work & pertinent test results, reviewed documented beta blocker date and time   History of Anesthesia Complications (+) PONV and history of anesthetic complications (denies any recent PONV)  Airway Mallampati: II  TM Distance: >3 FB Neck ROM: Full    Dental no notable dental hx. (+) Chipped, Missing,    Pulmonary neg pulmonary ROS   Pulmonary exam normal breath sounds clear to auscultation       Cardiovascular hypertension (125/80 preop), Pt. on medications and Pt. on home beta blockers + Past MI and + CABG (2023)  Normal cardiovascular exam+ Valvular Problems/Murmurs (mild MR) MR  Rhythm:Regular Rate:Normal  Last echo was post-bypass: LVEF 60-65%, mild MR, mild TR   Neuro/Psych  PSYCHIATRIC DISORDERS Anxiety     negative neurological ROS     GI/Hepatic Neg liver ROS, hiatal hernia,,,  Endo/Other  diabetes    Renal/GU Renal disease (R ureteral stone)  negative genitourinary   Musculoskeletal  (+)  Fibromyalgia -  Abdominal   Peds  Hematology negative hematology ROS (+) Hb 13.5, plt 239   Anesthesia Other Findings   Reproductive/Obstetrics negative OB ROS                              Anesthesia Physical Anesthesia Plan  ASA: 3  Anesthesia Plan: General   Post-op Pain Management: Tylenol  PO (pre-op)*   Induction: Intravenous  PONV Risk Score and Plan: 4 or greater and Ondansetron , Dexamethasone , Midazolam  and Treatment may vary due to age or medical condition  Airway Management Planned: LMA  Additional Equipment: None  Intra-op Plan:   Post-operative Plan: Extubation in OR  Informed Consent: I have reviewed the patients History and Physical, chart, labs and discussed the procedure including the risks, benefits and  alternatives for the proposed anesthesia with the patient or authorized representative who has indicated his/her understanding and acceptance.     Dental advisory given  Plan Discussed with: CRNA  Anesthesia Plan Comments:          Anesthesia Quick Evaluation

## 2024-05-08 NOTE — Progress Notes (Addendum)
 Anesthesia Review:  PCP: [facility] on [address] currently has a new MD which she has not seen yet  Cardiologist : Hilty LOV 10/31/23  Rosaline Percy bleak visit preop on 05/01/24  Neuro- Venetia Hill LOV 01/09/24   PPM/ ICD: Device Orders: Rep Notified:  Chest x-ray : EKG : 07/22/23  Echo : 2023  Stress test: Cardiac Cath :  2023   Activity level: can do a flight of stairs without difficutly  Sleep Study/ CPAP : none  Fasting Blood Sugar :      / Checks Blood Sugar -- times a day:    DM- type2- occ checks glucose at home glucose machine is currently broken per pt Hgba1c- 05/08/2024 -5.3  On no meds    Blood Thinner/ Instructions /Last Dose: ASA / Instructions/ Last Dose :   81 mg aspirin    PT states she ahs woken up the last 203 morning with a tickle in her throat.  Denies any fever, congestion , cough or runny nose.  Pt aware to monitor and to call Short Stay in am if anything further develops.

## 2024-05-09 ENCOUNTER — Ambulatory Visit (HOSPITAL_COMMUNITY): Admitting: Anesthesiology

## 2024-05-09 ENCOUNTER — Other Ambulatory Visit: Payer: Self-pay

## 2024-05-09 ENCOUNTER — Encounter (HOSPITAL_COMMUNITY): Admission: RE | Disposition: A | Payer: Self-pay | Source: Home / Self Care | Attending: Urology

## 2024-05-09 ENCOUNTER — Ambulatory Visit (HOSPITAL_COMMUNITY)
Admission: RE | Admit: 2024-05-09 | Discharge: 2024-05-09 | Disposition: A | Discharge status: Home / Self Care | Attending: Urology | Admitting: Urology

## 2024-05-09 ENCOUNTER — Encounter (HOSPITAL_COMMUNITY): Payer: Self-pay | Admitting: Physician Assistant

## 2024-05-09 ENCOUNTER — Ambulatory Visit (HOSPITAL_COMMUNITY)

## 2024-05-09 ENCOUNTER — Encounter (HOSPITAL_COMMUNITY): Payer: Self-pay | Admitting: Urology

## 2024-05-09 DIAGNOSIS — Z87442 Personal history of urinary calculi: Secondary | ICD-10-CM | POA: Insufficient documentation

## 2024-05-09 DIAGNOSIS — I10 Essential (primary) hypertension: Secondary | ICD-10-CM

## 2024-05-09 DIAGNOSIS — Z87440 Personal history of urinary (tract) infections: Secondary | ICD-10-CM | POA: Diagnosis not present

## 2024-05-09 DIAGNOSIS — N201 Calculus of ureter: Secondary | ICD-10-CM

## 2024-05-09 DIAGNOSIS — I252 Old myocardial infarction: Secondary | ICD-10-CM | POA: Insufficient documentation

## 2024-05-09 DIAGNOSIS — N200 Calculus of kidney: Secondary | ICD-10-CM

## 2024-05-09 DIAGNOSIS — E119 Type 2 diabetes mellitus without complications: Secondary | ICD-10-CM

## 2024-05-09 DIAGNOSIS — K449 Diaphragmatic hernia without obstruction or gangrene: Secondary | ICD-10-CM | POA: Insufficient documentation

## 2024-05-09 DIAGNOSIS — F419 Anxiety disorder, unspecified: Secondary | ICD-10-CM | POA: Diagnosis not present

## 2024-05-09 DIAGNOSIS — N132 Hydronephrosis with renal and ureteral calculous obstruction: Secondary | ICD-10-CM | POA: Insufficient documentation

## 2024-05-09 DIAGNOSIS — Z951 Presence of aortocoronary bypass graft: Secondary | ICD-10-CM | POA: Insufficient documentation

## 2024-05-09 DIAGNOSIS — Z01818 Encounter for other preprocedural examination: Secondary | ICD-10-CM

## 2024-05-09 LAB — GLUCOSE, CAPILLARY: Glucose-Capillary: 112 mg/dL — ABNORMAL HIGH (ref 70–99)

## 2024-05-09 SURGERY — CYSTOSCOPY/URETEROSCOPY/HOLMIUM LASER/STENT PLACEMENT
Anesthesia: General | Site: Pelvis | Laterality: Right

## 2024-05-09 MED ORDER — PROPOFOL 10 MG/ML IV BOLUS
INTRAVENOUS | Status: AC
  Filled 2024-05-09: qty 20

## 2024-05-09 MED ORDER — EPHEDRINE 5 MG/ML INJ
INTRAVENOUS | Status: AC
Start: 2024-05-09 — End: 2024-05-09
  Filled 2024-05-09: qty 5

## 2024-05-09 MED ORDER — SODIUM CHLORIDE 0.9 % IR SOLN
Status: DC | PRN
  Administered 2024-05-09: 3000 mL via INTRAVESICAL

## 2024-05-09 MED ORDER — EPHEDRINE 5 MG/ML INJ
INTRAVENOUS | Status: AC
  Filled 2024-05-09: qty 5

## 2024-05-09 MED ORDER — SUGAMMADEX SODIUM 200 MG/2ML IV SOLN
INTRAVENOUS | Status: AC
  Filled 2024-05-09: qty 2

## 2024-05-09 MED ORDER — ONDANSETRON HCL 4 MG/2ML IJ SOLN
INTRAMUSCULAR | Status: AC
  Filled 2024-05-09: qty 2

## 2024-05-09 MED ORDER — LIDOCAINE HCL (PF) 2 % IJ SOLN
INTRAMUSCULAR | Status: AC
  Filled 2024-05-09: qty 30

## 2024-05-09 MED ORDER — LIDOCAINE HCL (PF) 2 % IJ SOLN
INTRAMUSCULAR | Status: AC
  Filled 2024-05-09: qty 5

## 2024-05-09 MED ORDER — OXYCODONE HCL 5 MG PO TABS: 5.0000 mg | ORAL_TABLET | Freq: Once | ORAL | Status: DC | PRN

## 2024-05-09 MED ORDER — OXYCODONE-ACETAMINOPHEN 5-325 MG PO TABS: 1.0000 | ORAL_TABLET | ORAL | 0 refills | Status: AC | PRN

## 2024-05-09 MED ORDER — FENTANYL CITRATE (PF) 100 MCG/2ML IJ SOLN
INTRAMUSCULAR | Status: AC
  Filled 2024-05-09: qty 2

## 2024-05-09 MED ORDER — EPHEDRINE SULFATE-NACL 50-0.9 MG/10ML-% IV SOSY
PREFILLED_SYRINGE | INTRAVENOUS | Status: DC | PRN
  Administered 2024-05-09: 10 mg via INTRAVENOUS

## 2024-05-09 MED ORDER — AMISULPRIDE (ANTIEMETIC) 5 MG/2ML IV SOLN: 10.0000 mg | Freq: Once | INTRAVENOUS | Status: DC | PRN

## 2024-05-09 MED ORDER — OXYCODONE HCL 5 MG/5ML PO SOLN: 5.0000 mg | Freq: Once | ORAL | Status: DC | PRN

## 2024-05-09 MED ORDER — GENTAMICIN SULFATE 40 MG/ML IJ SOLN
5.0000 mg/kg | Freq: Once | INTRAVENOUS | Status: AC
  Administered 2024-05-09: 310 mg via INTRAVENOUS
  Filled 2024-05-09: qty 7.75

## 2024-05-09 MED ORDER — SUCCINYLCHOLINE CHLORIDE 200 MG/10ML IV SOSY
PREFILLED_SYRINGE | INTRAVENOUS | Status: AC
  Filled 2024-05-09: qty 10

## 2024-05-09 MED ORDER — HYDROMORPHONE HCL 1 MG/ML IJ SOLN: 0.2500 mg | INTRAMUSCULAR | Status: DC | PRN

## 2024-05-09 MED ORDER — FENTANYL CITRATE (PF) 100 MCG/2ML IJ SOLN
INTRAMUSCULAR | Status: DC | PRN
  Administered 2024-05-09: 50 ug via INTRAVENOUS

## 2024-05-09 MED ORDER — IOHEXOL 300 MG/ML  SOLN
INTRAMUSCULAR | Status: DC | PRN
  Administered 2024-05-09: 8 mL via URETHRAL

## 2024-05-09 MED ORDER — DEXAMETHASONE SOD PHOSPHATE PF 10 MG/ML IJ SOLN
INTRAMUSCULAR | Status: DC | PRN
  Administered 2024-05-09: 10 mg via INTRAVENOUS

## 2024-05-09 MED ORDER — PHENYLEPHRINE HCL (PRESSORS) 10 MG/ML IV SOLN
INTRAVENOUS | Status: DC | PRN
  Administered 2024-05-09: 160 ug via INTRAVENOUS

## 2024-05-09 MED ORDER — ROCURONIUM BROMIDE 10 MG/ML (PF) SYRINGE
PREFILLED_SYRINGE | INTRAVENOUS | Status: AC
  Filled 2024-05-09: qty 10

## 2024-05-09 MED ORDER — ORAL CARE MOUTH RINSE
15.0000 mL | Freq: Once | OROMUCOSAL | Status: AC
Start: 2024-05-09 — End: 2024-05-09

## 2024-05-09 MED ORDER — CHLORHEXIDINE GLUCONATE 0.12 % MT SOLN
15.0000 mL | Freq: Once | OROMUCOSAL | Status: AC
  Administered 2024-05-09: 15 mL via OROMUCOSAL

## 2024-05-09 MED ORDER — ACETAMINOPHEN 500 MG PO TABS
1000.0000 mg | ORAL_TABLET | Freq: Once | ORAL | Status: AC
  Administered 2024-05-09: 1000 mg via ORAL
  Filled 2024-05-09: qty 2

## 2024-05-09 MED ORDER — LACTATED RINGERS IV SOLN: INTRAVENOUS | Status: DC

## 2024-05-09 MED ORDER — ONDANSETRON HCL 4 MG/2ML IJ SOLN
INTRAMUSCULAR | Status: DC | PRN
  Administered 2024-05-09: 4 mg via INTRAVENOUS

## 2024-05-09 MED ORDER — PHENYLEPHRINE 80 MCG/ML (10ML) SYRINGE FOR IV PUSH (FOR BLOOD PRESSURE SUPPORT)
PREFILLED_SYRINGE | INTRAVENOUS | Status: AC
  Filled 2024-05-09: qty 10

## 2024-05-09 MED ORDER — PROPOFOL 500 MG/50ML IV EMUL
INTRAVENOUS | Status: DC | PRN
  Administered 2024-05-09: 150 mg via INTRAVENOUS

## 2024-05-09 MED ORDER — LIDOCAINE 2% (20 MG/ML) 5 ML SYRINGE
INTRAMUSCULAR | Status: DC | PRN
  Administered 2024-05-09: 60 mg via INTRAVENOUS

## 2024-05-09 MED ORDER — ONDANSETRON HCL 4 MG/2ML IJ SOLN: 4.0000 mg | Freq: Once | INTRAMUSCULAR | Status: DC | PRN

## 2024-05-09 SURGICAL SUPPLY — 22 items
BAG URO CATCHER STRL LF (MISCELLANEOUS) ×1 IMPLANT
BASKET ZERO TIP NITINOL 2.4FR (BASKET) IMPLANT
BENZOIN TINCTURE PRP APPL 2/3 (GAUZE/BANDAGES/DRESSINGS) IMPLANT
CATH URETERAL DUAL LUMEN 10F (MISCELLANEOUS) IMPLANT
CATH URETL OPEN END 6FR 70 (CATHETERS) IMPLANT
CLOTH BEACON ORANGE TIMEOUT ST (SAFETY) ×1 IMPLANT
DRSG TEGADERM 2-3/8X2-3/4 SM (GAUZE/BANDAGES/DRESSINGS) IMPLANT
FIBER LASER MOSES 200 DFL (Laser) IMPLANT
GLOVE BIOGEL M 7.0 STRL (GLOVE) ×1 IMPLANT
GOWN STRL REUS W/ TWL XL LVL3 (GOWN DISPOSABLE) ×1 IMPLANT
GUIDEWIRE STR DUAL SENSOR (WIRE) ×2 IMPLANT
GUIDEWIRE ZIPWRE .038 STRAIGHT (WIRE) IMPLANT
KIT TURNOVER KIT A (KITS) ×1 IMPLANT
MANIFOLD NEPTUNE II (INSTRUMENTS) ×1 IMPLANT
PACK CYSTO (CUSTOM PROCEDURE TRAY) ×1 IMPLANT
PAD PREP 24X48 CUFFED NSTRL (MISCELLANEOUS) ×1 IMPLANT
SHEATH DILATOR SET 8/10 (MISCELLANEOUS) IMPLANT
SHEATH NAVIGATOR HD 11/13X28 (SHEATH) IMPLANT
SHEATH NAVIGATOR HD 11/13X36 (SHEATH) IMPLANT
STENT URET 6FRX24 CONTOUR (STENTS) IMPLANT
TUBING CONNECTING 10 (TUBING) ×1 IMPLANT
TUBING UROLOGY SET (TUBING) ×1 IMPLANT

## 2024-05-09 NOTE — Discharge Instructions (Addendum)
 [facility] [phone] Post Ureteroscopy With or Without Stent Instructions  Definitions:  Ureter: The duct that transports urine from the kidney to the bladder. Stent:   A plastic hollow tube that is placed into the ureter, from the kidney to the bladder to prevent the ureter from swelling shut.  GENERAL INSTRUCTIONS:  Despite the fact that no skin incisions were used, the area around the ureter and bladder is raw and irritated. The stent is a foreign body which will further irritate the bladder wall. This irritation is manifested by increased frequency of urination, both day and night, and by an increase in the urge to urinate. In some, the urge to urinate is present almost always. Sometimes the urge is strong enough that you may not be able to stop yourself from urinating. The only real cure is to remove the stent and then give time for the bladder wall to heal which can't be done until the danger of the ureter swelling shut has passed, which varies.  You may see some blood in your urine while the stent is in place and a few days afterwards. Do not be alarmed, even if the urine was clear for a while. Get off your feet and drink lots of fluids until clearing occurs. If you start to pass clots or don't improve, call us .  DIET: You may return to your normal diet immediately. Because of the raw surface of your bladder, alcohol, spicy foods, acid type foods and drinks with caffeine may cause irritation or frequency and should be used in moderation. To keep your urine flowing freely and to avoid constipation, drink plenty of fluids during the day ( 8-10 glasses ). Tip: Avoid cranberry juice because it is very acidic.  ACTIVITY: Your physical activity doesn't need to be restricted. However, if you are very active, you may see some blood in your urine. We suggest that you reduce your activity under these circumstances until the bleeding has stopped.  BOWELS: It is important to  keep your bowels regular during the postoperative period. Straining with bowel movements can cause bleeding. A bowel movement every other day is reasonable. Use a mild laxative if needed, such as Milk of Magnesia 2-3 tablespoons, or 2 Dulcolax tablets. Call if you continue to have problems. If you have been taking narcotics for pain, before, during or after your surgery, you may be constipated. Take a laxative if necessary.   MEDICATION: You should resume your pre-surgery medications unless told not to. In addition you will often be given an antibiotic to prevent infection. These should be taken as prescribed until the bottles are finished unless you are having an unusual reaction to one of the drugs.  PROBLEMS YOU SHOULD REPORT TO US : Fevers over 100.5 Fahrenheit. Heavy bleeding, or clots ( See above notes about blood in urine ). Inability to urinate. Drug reactions ( hives, rash, nausea, vomiting, diarrhea ). Severe burning or pain with urination that is not improving.  FOLLOW-UP: You will need a follow-up appointment to monitor your progress. Call for this appointment at the number listed above. Usually the first appointment will be about three to fourteen days after your surgery.  You have a stent draining your kidney and this amy be removed in 3 days by pulling on attached string.

## 2024-05-09 NOTE — Transfer of Care (Signed)
 Immediate Anesthesia Transfer of Care Note  Patient: Tiffany Odonnell  Procedure(s) Performed: Procedure(s): CYSTOSCOPY/URETEROSCOPY/HOLMIUM LASER/STENT PLACEMENT (Right)  Patient Location: PACU  Anesthesia Type:General  Level of Consciousness: Alert, Awake, Oriented  Airway & Oxygen Therapy: Patient Spontanous Breathing  Post-op Assessment: Report given to RN  Post vital signs: Reviewed and stable  Last Vitals:  Vitals:   05/09/24 1302 05/09/24 1655  BP: 125/80 (!) 145/80  Pulse: 66 86  Resp: 16 13  Temp: 36.8 C   SpO2: 97% 100%    Complications: No apparent anesthesia complications

## 2024-05-09 NOTE — Anesthesia Procedure Notes (Signed)
 Procedure Name: LMA Insertion Date/Time: 05/09/2024 4:06 PM  Performed by: Samah Lapiana E, CRNAPre-anesthesia Checklist: Patient identified, Patient being monitored, Timeout performed, Emergency Drugs available and Suction available Patient Re-evaluated:Patient Re-evaluated prior to induction Oxygen Delivery Method: Circle system utilized Preoxygenation: Pre-oxygenation with 100% oxygen Induction Type: IV induction Ventilation: Mask ventilation without difficulty LMA: LMA inserted LMA Size: 4.0 Tube type: Oral Number of attempts: 1 Placement Confirmation: positive ETCO2 and breath sounds checked- equal and bilateral Tube secured with: Tape Dental Injury: Teeth and Oropharynx as per pre-operative assessment

## 2024-05-09 NOTE — Op Note (Signed)
 Operative Note  Preoperative diagnosis:  1.  Right distal ureteral stone  Postoperative diagnosis: 1.  Right renal stones  Procedure(s): 1.  Cystoscopy 2. Right ureteroscopy with laser lithotripsy and basket extraction of stones 3. Right retrograde pyelogram 4. Right ureteral stent placement 5. Fluoroscopy with intraoperative interpretation  Surgeon: Donnice Siad, MD  Assistants:  None  Anesthesia:  General  Complications:  None  EBL:  Minimal  Specimens: 1. Stones - discarded  Drains/Catheters: 1.  Right 6Fr x 24cm ureteral stent with a tether string  Intraoperative findings:   Cystoscopy demonstrated no suspicious bladder lesions. Right retrograde pyelogram demonstrated chronic severe right hydronephrosis, no extravasation of contrast, no filling defects. Right ureteroscopy demonstrated no evidence of ureteral stones however 2 small right renal stones that were fragmented and basket extracted. Successful stent placement.  Indication:  Tiffany Odonnell is a 73 y.o. female with a right ureteral stone here for definitive management.  Description of procedure: After informed consent was obtained from the patient, the patient was identified and taken to the operating room and placed in the supine position.  General anesthesia was administered as well as perioperative IV antibiotics.  At the beginning of the case, a time-out was performed to properly identify the patient, the surgery to be performed, and the surgical site.  Sequential compression devices were applied to the lower extremities at the beginning of the case for DVT prophylaxis.  The patient was then placed in the dorsal lithotomy supine position, prepped and draped in sterile fashion.  We then passed the 21-French rigid cystoscope through the urethra and into the bladder under vision without any difficulty, noting a normal urethra without strictures.  A systematic evaluation of the bladder revealed no evidence of  any suspicious bladder lesions.  Ureteral orifices were in normal position.    Under cystoscopic and flouroscopic guidance, we cannulated the right ureteral orifice with a 5-French open-ended ureteral catheter and a gentle retrograde pyelogram was performed, revealing a normal caliber ureter without any filling defects. There was apparent chronic severe right hydronephrosis of the collecting system. A 0.038 sensor wire was then passed up to the level of the renal pelvis and secured to the drape as a safety wire. The ureteral catheter and cystoscope were removed, leaving the safety wire in place.   A semi-rigid ureteroscope was passed alongside the wire up the distal ureter which appeared normal. I passed this all the way to the renal pelvis with no ureteral stone present. A second 0.038 sensor wire was passed under direct vision and the semirigid scope was removed. The flexible ureteroscope was advanced into the collecting system. The collecting system was inspected. The calculus was identified at the right upper and lower poles. Using the 272 micron holmium laser fiber, the stone was fragmented completely. A 2.2 Fr zero tip basket was used to remove the fragments under visual guidance. These were sent for chemical analysis. With the ureteroscope in the kidney, a gentle pyelogram was performed to delineate the calyceal system and we evaluated the calyces systematically. We encountered no further stones. The rest of the stone fragments were very tiny and these were  irrigated away gently. The calyces were re-inspected and there were no significant stone fragment residual.   We then withdrew the ureteroscope back down the ureter noting no evidence of any stones along the course of the ureter.  Prior to removing the ureteroscope, we did pass the Glidewire back up to the ureter to the renal pelvis.  Once  the ureteroscope was removed, we then used the Glidewire under fluoroscopic guidance and passed up a 6-French x  24 cm double-pigtail ureteral stent up the ureter, making sure that the proximal and distal ends coiled within the kidney and bladder respectively. We left a tether on the stent.  Note that we left a long tether string attached to the distal end of the ureteral stent and it exited the urethral meatus and was secured to the inner thigh with a tegaderm adhesive.  The cystoscope was then advanced back into the bladder under vision.  We were able to see the distal stent coiling nicely within the bladder.  The bladder was then emptied with irrigation solution.  The cystoscope was then removed.    The patient tolerated the procedure well and there was no complication. Patient was awoken from anesthesia and taken to the recovery room in stable condition. I was present and scrubbed for the entirety of the case.  Plan:  Patient will be discharged home and will remove stent in 3 days. F/u in one month with RUS prior.   Matt R. Tanijah Morais MD [facility]  Pager: [phone]

## 2024-05-09 NOTE — Anesthesia Postprocedure Evaluation (Signed)
 Anesthesia Post Note  Patient: Neurosurgeon  Procedure(s) Performed: CYSTOSCOPY/URETEROSCOPY/HOLMIUM LASER/STENT PLACEMENT (Right: Pelvis)     Patient location during evaluation: PACU Anesthesia Type: General Level of consciousness: awake and alert Pain management: pain level controlled Vital Signs Assessment: post-procedure vital signs reviewed and stable Respiratory status: spontaneous breathing, nonlabored ventilation, respiratory function stable and patient connected to nasal cannula oxygen Cardiovascular status: blood pressure returned to baseline and stable Postop Assessment: no apparent nausea or vomiting Anesthetic complications: no   No notable events documented.  Last Vitals:  Vitals:   05/09/24 1725 05/09/24 1735  BP: 129/75 (!) 144/76  Pulse: 68   Resp: 14   Temp: (!) 36.3 C 36.4 C  SpO2: 98%     Last Pain:  Vitals:   05/09/24 1735  TempSrc:   PainSc: 1                  Thom JONELLE Peoples

## 2024-05-09 NOTE — H&P (Signed)
 Urology Preoperative H&P   Chief Complaint: right ureteral stone  History of Present Illness: Tiffany Odonnell is a 73 y.o. female with a right ureteral stone at the right UVJ here for right URS/LL. Denies fevers, chills, dysuria.  1. Urolithiasis: She has a long history of urolithiasis. This has included a right PCNL in 05/2015 and many episodes of medical expulsive therapy. 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 Since that time, she has been managed with potassiums citrate 30 mEq BID and indapamide  2.5 mg daily.  2022: -Renal protocol CT 03/14/2021 confirmed presence of 2.5 cm right lower pole stone. -S/p R PCNL 07/15/2021. -Post-op CT A/P 07/16/2021 with punctate bilateral stones -Stone analysis: 100% carbonate appetite -Renal ultrasound 08/2021 with no hydronephrosis. -Metabolic evaluation: Hypercalciuria profile 10/2021 with mildly elevated PTH and normal calcium . 24-hour urine 10/2021 urine volume 2.3L, hypocitraturia 227 Mg/day, high urine pH 6.8. -She continues to take potassium citrate  30 mEq BID. -KUB 07/26/2022 with 4 to 6 mm right and lower pole stones.  -CT 9/25 with 3 mm stone at the right UVJ with right hydroureteronephrosis. Additional nonobstructive bilateral stones moderate to 3 mm in the right and 2 mm in the left. - She has not passed her stone. She still has intermittent pain. Renal ultrasound today with persistent right hydronephrosis  She denies abdominal pain or flank pain. She denies fevers, chills or dysuria.  #2. Stress urinary incontinence: She also complains of stress urinary incontinence and has been following with PFPT who she last saw in 08/2020. She reports that these exercises have greatly helped her symptoms. - Unable to reproduce stress urinary continence on exam however she did void prior to evaluation.  3. History of UTI: -She had a UTI in 03/2021; greater than 100,000 Klebsiella. She had recurrent urinary tract  infection in 06/2022 that was treated successfully with antibiotics.  #4. Pelvic organ prolapse: She went anterior repair at [facility] remotely. She has also had a hysterectomy. She has noticed return of symptoms including sensation of bulge. She has intermittent pelvic discomfort. She is interested in repair.  Patient currently denies fever, chills, sweats, nausea, vomiting, abdominal or flank pain, gross hematuria or dysuria.  Past Medical History:  Diagnosis Date   Anxiety    Blood in urine    Cervical disc disorder    bulging disc   Complication of anesthesia    Coronary artery disease    Diabetes mellitus without complication (HCC)    Dizziness    Falls    Fibromyalgia    H/O Salmonella gastroenteritis    High cholesterol    History of frequent urinary tract infections    History of hiatal hernia    History of kidney stones    History of vertigo    Hypertension    Imbalance    Lichen planopilaris    Lumbar herniated disc    L5-L6   Myocardial infarction (HCC)    Numbness and tingling    hands and feet bilat    Oscillopsia    Peripheral neuropathy    PONV (postoperative nausea and vomiting)    Septicemia (HCC)    2013   Sinusitis    Sjogren's disease    Tinnitus    Wears glasses     Past Surgical History:  Procedure Laterality Date   ABDOMINAL HYSTERECTOMY     2014   CARDIAC CATHETERIZATION     CORONARY ARTERY BYPASS GRAFT N/A 08/05/2021   Procedure: CORONARY ARTERY BYPASS  GRAFTING (CABG) TIMES THREE, USING LEFT INTERNAL MAMMARTY ARTERY AND ENDOSCOPICALLY HARVESTED RIGHT GREATER SAPHENOUS VEIN;  Surgeon: Lucas Dorise POUR, MD;  Location: [facility] OR;  Service: Open Heart Surgery;  Laterality: N/A;   ENDOVEIN HARVEST OF GREATER SAPHENOUS VEIN Right 08/05/2021   Procedure: ENDOVEIN HARVEST OF GREATER SAPHENOUS VEIN;  Surgeon: Lucas Dorise POUR, MD;  Location: [facility] OR;  Service: Open Heart Surgery;  Laterality: Right;   IR URETERAL STENT RIGHT NEW ACCESS W/O SEP NEPHROSTOMY  CATH  07/15/2021   KNEE ARTHROSCOPY WITH MEDIAL MENISECTOMY Left 10/29/2019   Procedure: KNEE ARTHROSCOPY WITH MEDIAL AND LATERAL MENISECTOMY WITH DEBRIDEMENT;  Surgeon: Duwayne Purchase, MD;  Location: [facility] ORS;  Service: Orthopedics;  Laterality: Left;  60 mins   LEFT HEART CATH AND CORONARY ANGIOGRAPHY N/A 08/02/2021   Procedure: LEFT HEART CATH AND CORONARY ANGIOGRAPHY;  Surgeon: Dann Candyce RAMAN, MD;  Location: [facility] INVASIVE CV LAB;  Service: Cardiovascular;  Laterality: N/A;   NASAL SINUS SURGERY     NEPHROLITHOTOMY Right 05/31/2015   Procedure: RIGHT PERCUTANEOUS NEPHROLITHOTOMY  ;  Surgeon: Mark Ottelin, MD;  Location: [facility] OR[facility];  Service: Urology;  Laterality: Right;   NEPHROLITHOTOMY Right 07/15/2021   Procedure: NEPHROLITHOTOMY PERCUTANEOUS;  Surgeon: Selma Donnice SAUNDERS, MD;  Location: [facility] OR[facility];  Service: Urology;  Laterality: Right;   prolapsed bladder and rectocele surgery      TEE WITHOUT CARDIOVERSION N/A 08/05/2021   Procedure: TRANSESOPHAGEAL ECHOCARDIOGRAM (TEE);  Surgeon: Lucas Dorise POUR, MD;  Location: [facility] OR;  Service: Open Heart Surgery;  Laterality: N/A;   TUBAL LIGATION     1983   URETERAL STENT PLACEMENT     times 2 secondary to kidney stone     Allergies:  Allergies  Allergen Reactions   Augmentin  [Amoxicillin -Pot Clavulanate] Nausea And Vomiting   Ciprofloxacin Hives   Sulfa Antibiotics Other (See Comments)    Trembling, paralyzing pain, red eyes, skin crawling   Latex Itching   Macrolides And Ketolides Diarrhea and Nausea And Vomiting   Tetracyclines & Related Other (See Comments) and Tinitus    Vertigo    Family History  Problem Relation Age of Onset   CAD Mother    Diabetes Father    Glaucoma Maternal Uncle    Breast cancer Cousin    Non-Hodgkin's lymphoma Brother    Non-Hodgkin's lymphoma Brother    Non-Hodgkin's lymphoma Brother        half brother   Lupus Other    Hypertension Other    Diabetes Other     Social History:  reports that she has never  smoked. She has never used smokeless tobacco. She reports that she does not drink alcohol and does not use drugs.  ROS: A complete review of systems was performed.  All systems are negative except for pertinent findings as noted.  Physical Exam:  Vital signs in last 24 hours: Temp:  [98.3 F (36.8 C)-98.5 F (36.9 C)] 98.3 F (36.8 C) (11/07 1302) Pulse Rate:  [66-70] 66 (11/07 1302) Resp:  [16] 16 (11/07 1302) BP: (125-140)/(80) 125/80 (11/07 1302) SpO2:  [97 %-98 %] 97 % (11/07 1302) Weight:  [59.9 kg] 59.9 kg (11/06 1442) Constitutional:  Alert and oriented, No acute distress Cardiovascular: Regular rate and rhythm Respiratory: Normal respiratory effort, Lungs clear bilaterally GI: Abdomen is soft, nontender, nondistended, no abdominal masses GU: No CVA tenderness Lymphatic: No lymphadenopathy Neurologic: Grossly intact, no focal deficits Psychiatric: Normal mood and affect  Laboratory Data:  Recent Labs    05/08/24 1431  WBC 7.9  HGB 13.5  HCT 41.6  PLT 239    Recent Labs    05/08/24 1431  NA 136  K 4.4  CL 99  GLUCOSE 138*  BUN 17  CALCIUM  9.6  CREATININE 0.80     Results for orders placed or performed during the hospital encounter of 05/08/24 (from the past 24 hours)  Glucose, capillary     Status: Abnormal   Collection Time: 05/08/24  2:29 PM  Result Value Ref Range   Glucose-Capillary 146 (H) 70 - 99 mg/dL  Hemoglobin J8r per protocol     Status: None   Collection Time: 05/08/24  2:31 PM  Result Value Ref Range   Hgb A1c MFr Bld 5.3 4.8 - 5.6 %   Mean Plasma Glucose 105.41 mg/dL  Basic metabolic panel per protocol     Status: Abnormal   Collection Time: 05/08/24  2:31 PM  Result Value Ref Range   Sodium 136 135 - 145 mmol/L   Potassium 4.4 3.5 - 5.1 mmol/L   Chloride 99 98 - 111 mmol/L   CO2 27 22 - 32 mmol/L   Glucose, Bld 138 (H) 70 - 99 mg/dL   BUN 17 8 - 23 mg/dL   Creatinine, Ser 9.19 0.44 - 1.00 mg/dL   Calcium  9.6 8.9 - 10.3 mg/dL    GFR, Estimated >39 >39 mL/min   Anion gap 10 5 - 15  CBC per protocol     Status: None   Collection Time: 05/08/24  2:31 PM  Result Value Ref Range   WBC 7.9 4.0 - 10.5 K/uL   RBC 4.40 3.87 - 5.11 MIL/uL   Hemoglobin 13.5 12.0 - 15.0 g/dL   HCT 58.3 63.9 - 53.9 %   MCV 94.5 80.0 - 100.0 fL   MCH 30.7 26.0 - 34.0 pg   MCHC 32.5 30.0 - 36.0 g/dL   RDW 88.0 88.4 - 84.4 %   Platelets 239 150 - 400 K/uL   nRBC 0.0 0.0 - 0.2 %   No results found for this or any previous visit (from the past 240 hours).  Renal Function: Recent Labs    05/08/24 1431  CREATININE 0.80   Estimated Creatinine Clearance: 50.7 mL/min (by C-G formula based on SCr of 0.8 mg/dL).  Radiologic Imaging: No results found.  I independently reviewed the above imaging studies.  Assessment and Plan Tayah Idrovo is a 73 y.o. female with right ureteral stone here for right URS/LL, R stent placement.  -The risks, benefits and alternatives of cystoscopy with right URS/LL, right JJ stent placement was discussed with the patient.  Risks include, but are not limited to: bleeding, urinary tract infection, ureteral injury, ureteral stricture disease, chronic pain, urinary symptoms, bladder injury, stent migration, the need for nephrostomy tube placement, MI, CVA, DVT, PE and the inherent risks with general anesthesia.  The patient voices understanding and wishes to proceed.       Matt R. Jaymarion Trombly MD 05/09/2024, 1:16 PM  [facility] Pager: [phone]

## 2024-05-10 ENCOUNTER — Encounter (HOSPITAL_COMMUNITY): Payer: Self-pay | Admitting: Urology

## 2024-08-22 ENCOUNTER — Ambulatory Visit: Admitting: Neurology
